# Patient Record
Sex: Female | Born: 1945 | Race: White | Hispanic: No | Marital: Married | Smoking: Former smoker
Health system: Southern US, Community
[De-identification: ages and names within clinical notes are randomized; demographics above are authoritative.]

## PROBLEM LIST (undated history)

## (undated) DIAGNOSIS — I10 Essential (primary) hypertension: Secondary | ICD-10-CM

## (undated) DIAGNOSIS — K5790 Diverticulosis of intestine, part unspecified, without perforation or abscess without bleeding: Secondary | ICD-10-CM

## (undated) DIAGNOSIS — E1140 Type 2 diabetes mellitus with diabetic neuropathy, unspecified: Secondary | ICD-10-CM

## (undated) DIAGNOSIS — G459 Transient cerebral ischemic attack, unspecified: Secondary | ICD-10-CM

## (undated) DIAGNOSIS — E119 Type 2 diabetes mellitus without complications: Secondary | ICD-10-CM

## (undated) DIAGNOSIS — K922 Gastrointestinal hemorrhage, unspecified: Secondary | ICD-10-CM

## (undated) DIAGNOSIS — K529 Noninfective gastroenteritis and colitis, unspecified: Secondary | ICD-10-CM

## (undated) DIAGNOSIS — K219 Gastro-esophageal reflux disease without esophagitis: Secondary | ICD-10-CM

## (undated) DIAGNOSIS — Z9889 Other specified postprocedural states: Secondary | ICD-10-CM

## (undated) DIAGNOSIS — G250 Essential tremor: Secondary | ICD-10-CM

## (undated) DIAGNOSIS — R011 Cardiac murmur, unspecified: Secondary | ICD-10-CM

## (undated) DIAGNOSIS — F419 Anxiety disorder, unspecified: Secondary | ICD-10-CM

## (undated) DIAGNOSIS — J302 Other seasonal allergic rhinitis: Secondary | ICD-10-CM

## (undated) DIAGNOSIS — M1990 Unspecified osteoarthritis, unspecified site: Secondary | ICD-10-CM

## (undated) DIAGNOSIS — R0602 Shortness of breath: Secondary | ICD-10-CM

## (undated) DIAGNOSIS — R112 Nausea with vomiting, unspecified: Secondary | ICD-10-CM

## (undated) HISTORY — DX: Cardiac murmur, unspecified: R01.1

## (undated) HISTORY — PX: ESOPHAGOGASTRODUODENOSCOPY: SHX1529

## (undated) HISTORY — PX: BREAST SURGERY: SHX581

## (undated) HISTORY — PX: ABDOMINAL HYSTERECTOMY: SHX81

## (undated) HISTORY — DX: Essential (primary) hypertension: I10

## (undated) HISTORY — DX: Type 2 diabetes mellitus with diabetic neuropathy, unspecified: E11.40

## (undated) HISTORY — DX: Unspecified osteoarthritis, unspecified site: M19.90

## (undated) HISTORY — PX: TONSILLECTOMY: SUR1361

## (undated) HISTORY — PX: CHOLECYSTECTOMY: SHX55

## (undated) HISTORY — PX: COLON SURGERY: SHX602

## (undated) HISTORY — DX: Transient cerebral ischemic attack, unspecified: G45.9

## (undated) HISTORY — PX: OTHER SURGICAL HISTORY: SHX169

## (undated) HISTORY — DX: Type 2 diabetes mellitus without complications: E11.9

## (undated) HISTORY — PX: APPENDECTOMY: SHX54

## (undated) HISTORY — PX: REPLACEMENT TOTAL KNEE BILATERAL: SUR1225

## (undated) HISTORY — DX: Noninfective gastroenteritis and colitis, unspecified: K52.9

## (undated) HISTORY — DX: Essential tremor: G25.0

## (undated) HISTORY — DX: Diverticulosis of intestine, part unspecified, without perforation or abscess without bleeding: K57.90

## (undated) HISTORY — DX: Gastrointestinal hemorrhage, unspecified: K92.2

---

## 2004-08-26 ENCOUNTER — Ambulatory Visit: Payer: Self-pay | Admitting: Internal Medicine

## 2004-09-16 ENCOUNTER — Ambulatory Visit (HOSPITAL_COMMUNITY): Admission: RE | Admit: 2004-09-16 | Discharge: 2004-09-16 | Payer: Self-pay | Admitting: Internal Medicine

## 2004-09-16 ENCOUNTER — Ambulatory Visit: Payer: Self-pay | Admitting: Internal Medicine

## 2004-09-16 HISTORY — PX: COLONOSCOPY: SHX174

## 2004-09-16 HISTORY — PX: ESOPHAGOGASTRODUODENOSCOPY: SHX1529

## 2004-10-28 ENCOUNTER — Ambulatory Visit: Payer: Self-pay | Admitting: Internal Medicine

## 2005-06-27 ENCOUNTER — Emergency Department (HOSPITAL_COMMUNITY): Admission: EM | Admit: 2005-06-27 | Discharge: 2005-06-27 | Payer: Self-pay | Admitting: Emergency Medicine

## 2005-07-01 ENCOUNTER — Ambulatory Visit: Payer: Self-pay | Admitting: Orthopedic Surgery

## 2005-08-12 ENCOUNTER — Ambulatory Visit: Payer: Self-pay | Admitting: Orthopedic Surgery

## 2006-08-24 ENCOUNTER — Ambulatory Visit: Payer: Self-pay | Admitting: Orthopedic Surgery

## 2006-10-04 ENCOUNTER — Ambulatory Visit: Payer: Self-pay | Admitting: Orthopedic Surgery

## 2007-03-30 ENCOUNTER — Ambulatory Visit (HOSPITAL_BASED_OUTPATIENT_CLINIC_OR_DEPARTMENT_OTHER): Admission: RE | Admit: 2007-03-30 | Discharge: 2007-03-30 | Payer: Self-pay | Admitting: Orthopedic Surgery

## 2008-12-02 ENCOUNTER — Telehealth (INDEPENDENT_AMBULATORY_CARE_PROVIDER_SITE_OTHER): Payer: Self-pay | Admitting: *Deleted

## 2009-03-03 HISTORY — PX: COLONOSCOPY: SHX174

## 2010-06-29 ENCOUNTER — Ambulatory Visit (INDEPENDENT_AMBULATORY_CARE_PROVIDER_SITE_OTHER): Payer: Self-pay | Admitting: Internal Medicine

## 2010-06-29 DIAGNOSIS — K279 Peptic ulcer, site unspecified, unspecified as acute or chronic, without hemorrhage or perforation: Secondary | ICD-10-CM

## 2010-07-25 ENCOUNTER — Encounter: Payer: Self-pay | Admitting: Cardiology

## 2010-07-27 ENCOUNTER — Encounter: Payer: Self-pay | Admitting: Cardiology

## 2010-07-29 ENCOUNTER — Encounter: Payer: Self-pay | Admitting: Cardiology

## 2010-07-30 ENCOUNTER — Encounter: Payer: Self-pay | Admitting: Cardiology

## 2010-09-15 ENCOUNTER — Encounter: Payer: Self-pay | Admitting: Cardiology

## 2010-09-16 ENCOUNTER — Encounter: Payer: Self-pay | Admitting: Cardiology

## 2010-09-16 ENCOUNTER — Ambulatory Visit (INDEPENDENT_AMBULATORY_CARE_PROVIDER_SITE_OTHER): Payer: PRIVATE HEALTH INSURANCE | Admitting: Cardiology

## 2010-09-16 DIAGNOSIS — E119 Type 2 diabetes mellitus without complications: Secondary | ICD-10-CM | POA: Insufficient documentation

## 2010-09-16 DIAGNOSIS — R0602 Shortness of breath: Secondary | ICD-10-CM | POA: Insufficient documentation

## 2010-09-16 DIAGNOSIS — R011 Cardiac murmur, unspecified: Secondary | ICD-10-CM

## 2010-09-16 DIAGNOSIS — I10 Essential (primary) hypertension: Secondary | ICD-10-CM | POA: Insufficient documentation

## 2010-09-16 DIAGNOSIS — N1832 Type 2 diabetes mellitus with diabetic chronic kidney disease: Secondary | ICD-10-CM | POA: Insufficient documentation

## 2010-09-16 NOTE — Assessment & Plan Note (Signed)
Reports long-standing history of cardiac murmur and previous testing approximately 20 years ago. In light of her shortness of breath, progressive since January, a followup echocardiogram will be obtained for further assessment, although on exam her murmur is not particularly impressive.

## 2010-09-16 NOTE — Assessment & Plan Note (Signed)
Continue regular followup with Dr. Sherril Croon.

## 2010-09-16 NOTE — Patient Instructions (Signed)
Your physician has requested that you have an echocardiogram. Echocardiography is a painless test that uses sound waves to create images of your heart. It provides your doctor with information about the size and shape of your heart and how well your heart's chambers and valves are working. This procedure takes approximately one hour. There are no restrictions for this procedure.  Your physician has requested that you have a lexiscan myoview. For further information please visit [email or web address] Please follow instruction sheet, as given.  Your physician recommends that you continue on your current medications as directed. Please refer to the Current Medication list given to you today.  Your physician recommends that you schedule a follow-up appointment in: 1 month

## 2010-09-16 NOTE — Assessment & Plan Note (Signed)
Blood pressure control is reasonable today, no medication changes made.

## 2010-09-16 NOTE — Progress Notes (Signed)
Clinical Summary [name] is a 65 y.o.female referred for cardiology consultation by Dr. Sherril Croon. Record review finds admission to [facility] in March with nausea and emesis, diagnosed with gastroenteritis, also possibly pneumonia. At that time total CK was elevated at 194 with MB relative index of 5.1, however troponin I less than 0.01. Hemoglobin was 10.4. Reason for referral is stated as "abnormal cardiac enzymes."  [name] denies any exertional chest pain. She has noticed increasing exertional fatigue and shortness of breath since January, with activities such as climbing stairs or walking across a parking lot. She reports a long-standing history of "heart murmur" and reports previous cardiac testing through [facility] approximately 20 years ago, records not available. She denies any known personal history of CAD or major valvular heart disease.  She does indicate significant family history of cardiovascular disease in her siblings, had similar age.   Allergies  Allergen Reactions  . Codeine   . Morphine And Related     Current outpatient prescriptions:atorvastatin (LIPITOR) 10 MG tablet, Take 10 mg by mouth daily.  , Disp: , Rfl: ;  clonazePAM (KLONOPIN) 0.5 MG tablet, Take 0.5 mg by mouth 2 (two) times daily. , Disp: , Rfl: ;  Coenzyme Q10 (COQ10) 100 MG CAPS, Take 1 capsule by mouth daily.  , Disp: , Rfl: ;  CYANOCOBALAMIN IJ, Inject as directed.  , Disp: , Rfl: ;  dicyclomine (BENTYL) 20 MG tablet, Take 20 mg by mouth every 6 (six) hours.  , Disp: , Rfl:  diltiazem (CARDIZEM CD) 180 MG 24 hr capsule, Take 180 mg by mouth daily.  , Disp: , Rfl: ;  diphenoxylate-atropine (LOMOTIL) 2.5-0.025 MG per tablet, Take 1 tablet by mouth as needed.  , Disp: , Rfl: ;  ergocalciferol (VITAMIN D2) 50000 UNITS capsule, Take 50,000 Units by mouth once a week.  , Disp: , Rfl: ;  fexofenadine (ALLEGRA) 180 MG tablet, Take 180 mg by mouth daily.  , Disp: , Rfl:  glyBURIDE-metformin  (GLUCOVANCE) 2.5-500 MG per tablet, Take 1 tablet by mouth 2 (two) times daily. , Disp: , Rfl: ;  lisinopril-hydrochlorothiazide (PRINZIDE,ZESTORETIC) 10-12.5 MG per tablet, Take 1 tablet by mouth daily.  , Disp: , Rfl: ;  meclizine (ANTIVERT) 25 MG tablet, Take 25 mg by mouth 3 (three) times daily.  , Disp: , Rfl: ;  pantoprazole (PROTONIX) 40 MG tablet, Take 40 mg by mouth daily.  , Disp: , Rfl:  primidone (MYSOLINE) 50 MG tablet, Take 50 mg by mouth daily. 4 tabs bid  , Disp: , Rfl: ;  propranolol (INDERAL LA) 120 MG 24 hr capsule, Take 120 mg by mouth daily.  , Disp: , Rfl: ;  traMADol (ULTRAM) 50 MG tablet, Take 50 mg by mouth every 6 (six) hours as needed.  , Disp: , Rfl: ;  DISCONTD: benzonatate (TESSALON) 200 MG capsule, Take 200 mg by mouth 3 (three) times daily as needed.  , Disp: , Rfl:  DISCONTD: Choline Fenofibrate 135 MG capsule, Take 135 mg by mouth daily.  , Disp: , Rfl: ;  DISCONTD: fluticasone (VERAMYST) 27.5 MCG/SPRAY nasal spray, 2 sprays by Nasal route daily.  , Disp: , Rfl: ;  DISCONTD: gabapentin (NEURONTIN) 100 MG capsule, Take 100 mg by mouth 2 (two) times daily.  , Disp: , Rfl: ;  DISCONTD: HYDROcodone-acetaminophen (VICODIN) 5-500 MG per tablet, Take 1 tablet by mouth every 6 (six) hours as needed.  , Disp: , Rfl:  DISCONTD: hyoscyamine (LEVBID) 0.375 MG 12 hr  tablet, Take 0.375 mg by mouth every 12 (twelve) hours as needed.  , Disp: , Rfl: ;  DISCONTD: METHYLPREDNISOLONE, PAK, PO, Take 1 tablet by mouth daily.  , Disp: , Rfl: ;  DISCONTD: mometasone (NASONEX) 50 MCG/ACT nasal spray, 2 sprays by Nasal route daily.  , Disp: , Rfl: ;  DISCONTD: omeprazole (PRILOSEC) 20 MG capsule, Take 20 mg by mouth daily.  , Disp: , Rfl:  DISCONTD: propranolol (INDERAL LA) 120 MG 24 hr capsule, Take 120 mg by mouth daily.  , Disp: , Rfl: ;  DISCONTD: scopolamine (TRANSDERM-SCOP) 1.5 MG, Place 1 patch onto the skin every 3 (three) days.  , Disp: , Rfl: ;  DISCONTD: silver sulfADIAZINE (SILVADENE) 1 %  cream, Apply topically daily.  , Disp: , Rfl: ;  DISCONTD: sucralfate (CARAFATE) 1 G tablet, Take 1 g by mouth 4 (four) times daily.  , Disp: , Rfl:   Past Medical History  Diagnosis Date  . Essential hypertension, benign   . Type 2 diabetes mellitus   . Diabetic neuropathy   . GI bleed     NSAID use  . TIA (transient ischemic attack)     1998  . Diverticulosis   . Urinary incontinence   . Degenerative joint disease   . Benign essential tremor     Past Surgical History  Procedure Date  . Replacement total knee bilateral   . Cholecystectomy   . Arthroscopic left shoulder surgery   . Appendectomy   . Colostomy with reversal   . Right elbow surgery     Family History  Problem Relation Age of Onset  . Cancer Father     Leukemia    Social History [name] reports that she has quit smoking. Her smoking use included Cigarettes. She has never used smokeless tobacco. [name] reports that she does not drink alcohol.  Review of Systems History of palpitations, no dizziness or syncope. Reports some solid dysphagia. Reflux symptoms. No dizziness or syncope. No orthopnea or PND. No unusual bleeding. Otherwise reviewed and negative.  Physical Examination Filed Vitals:   09/16/10 1431  BP: 130/66  Pulse: 89  Overweight woman in no acute distress. HEENT: Conjunctiva and lids normal, oropharynx with moist mucosa. Neck: Supple, no elevated JVP or bruits. Cardiac: Regular rate and rhythm, soft late systolic murmur, no S3 gallop or rub. Abdomen: Soft, nontender, bowel sounds present. Skin: Warm and dry. Musculoskeletal: No kyphosis. Neuropsychiatric: Alert and oriented x3, affect appropriate.   ECG Normal sinus rhythm at 86 beats per minute.   Problem List and Plan

## 2010-09-16 NOTE — Assessment & Plan Note (Signed)
Progressive since January. Does report family history of cardiac disease, also personal cardiac risk factors including diabetes mellitus and hypertension. Resting ECG is normal. Followup ischemic testing is being arranged. A Lexiscan Cardiolite will be performed as the patient states she will likely  have difficulty ambulating on the treadmill.

## 2010-09-21 ENCOUNTER — Other Ambulatory Visit: Payer: Self-pay | Admitting: Cardiology

## 2010-09-21 DIAGNOSIS — R0602 Shortness of breath: Secondary | ICD-10-CM

## 2010-09-24 ENCOUNTER — Ambulatory Visit (HOSPITAL_COMMUNITY): Payer: Medicare Other

## 2010-09-24 ENCOUNTER — Encounter (HOSPITAL_COMMUNITY): Payer: PRIVATE HEALTH INSURANCE

## 2010-09-24 ENCOUNTER — Ambulatory Visit (HOSPITAL_COMMUNITY)
Admission: RE | Admit: 2010-09-24 | Discharge: 2010-09-24 | Disposition: A | Discharge status: Home / Self Care | Payer: Medicare Other | Source: Ambulatory Visit | Attending: Cardiology | Admitting: Cardiology

## 2010-09-24 ENCOUNTER — Encounter (HOSPITAL_COMMUNITY): Payer: Self-pay

## 2010-09-24 ENCOUNTER — Encounter (HOSPITAL_COMMUNITY)
Admission: RE | Admit: 2010-09-24 | Discharge: 2010-09-24 | Disposition: A | Discharge status: Home / Self Care | Payer: PRIVATE HEALTH INSURANCE | Source: Ambulatory Visit | Attending: Cardiology | Admitting: Cardiology

## 2010-09-24 ENCOUNTER — Ambulatory Visit (INDEPENDENT_AMBULATORY_CARE_PROVIDER_SITE_OTHER): Payer: PRIVATE HEALTH INSURANCE | Admitting: *Deleted

## 2010-09-24 DIAGNOSIS — I517 Cardiomegaly: Secondary | ICD-10-CM

## 2010-09-24 DIAGNOSIS — R0989 Other specified symptoms and signs involving the circulatory and respiratory systems: Secondary | ICD-10-CM | POA: Insufficient documentation

## 2010-09-24 DIAGNOSIS — R079 Chest pain, unspecified: Secondary | ICD-10-CM

## 2010-09-24 DIAGNOSIS — R0602 Shortness of breath: Secondary | ICD-10-CM

## 2010-09-24 DIAGNOSIS — E119 Type 2 diabetes mellitus without complications: Secondary | ICD-10-CM | POA: Insufficient documentation

## 2010-09-24 DIAGNOSIS — I10 Essential (primary) hypertension: Secondary | ICD-10-CM | POA: Insufficient documentation

## 2010-09-24 DIAGNOSIS — R011 Cardiac murmur, unspecified: Secondary | ICD-10-CM | POA: Insufficient documentation

## 2010-09-24 DIAGNOSIS — R748 Abnormal levels of other serum enzymes: Secondary | ICD-10-CM | POA: Insufficient documentation

## 2010-09-24 DIAGNOSIS — R0609 Other forms of dyspnea: Secondary | ICD-10-CM | POA: Insufficient documentation

## 2010-09-24 DIAGNOSIS — R9439 Abnormal result of other cardiovascular function study: Secondary | ICD-10-CM | POA: Insufficient documentation

## 2010-09-24 MED ORDER — TECHNETIUM TC 99M TETROFOSMIN IV KIT
30.0000 | PACK | Freq: Once | INTRAVENOUS | Status: AC | PRN
  Administered 2010-09-24: 28.5 via INTRAVENOUS

## 2010-09-24 MED ORDER — TECHNETIUM TC 99M TETROFOSMIN IV KIT
10.0000 | PACK | Freq: Once | INTRAVENOUS | Status: AC | PRN
  Administered 2010-09-24: 10.6 via INTRAVENOUS

## 2010-09-28 NOTE — Progress Notes (Signed)
Please see dictated report for Stress Nuclear Study.   

## 2010-10-06 NOTE — Op Note (Signed)
NAMEMARISELDA, Bowen              ACCOUNT NO.:  [id number]   MEDICAL RECORD NO.:  [id number]          PATIENT TYPE:  AMB   LOCATION:  DSC                          FACILITY:  [facility]   PHYSICIAN:  Katy Fitch. Sypher, M.D. DATE OF BIRTH:  1945/07/28   DATE OF PROCEDURE:  03/30/2007  DATE OF DISCHARGE:                               OPERATIVE REPORT   PREOPERATIVE DIAGNOSIS:  Chronic pain, left shoulder due to a  combination of predicaments including adhesive capsulitis associated  with type 2 diabetes and chronic stage II impingement due to [facility]  arthropathy, rule out partial or small full-thickness rotator cuff tear  based on preoperative MRI findings.   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   OPERATION:  1. Examination of left shoulder under anesthesia documenting loss of      extension, external and internal rotation as well as combined      elevation.  2. Arthroscopic evaluation of glenohumeral joint followed by      debridement of granulation tissue and release of anterior superior      glenohumeral ligament and debridement of capsular adhesions with      tenolysis of subscapularis tendon and resection of rotator interval      as well as debridement of a minor partial-thickness supraspinatus      rotator cuff deep surface tear.  3. Arthroscopic subacromial decompression with bursectomy, tenolysis      of rotator cuff, acromioplasty and relaxation of coracoacromial      ligament.  4. Arthroscopic distal clavicle resection.   OPERATIONS:  [name], M.D.   ASSISTANT:  Annye Rusk, PA-C.   ANESTHESIA:  General by endotracheal technique.   SUPERVISING ANESTHESIOLOGIST:  Dr. Gypsy Balsam.   INDICATIONS:  [name] is a 65 year old woman referred for  evaluation and management of a  progressively painful left shoulder.  She  had type 2 diabetes that was reasonably controlled.  She was referred to  [facility] [place]  prolonged course of supervised therapy without improvement in her range  of motion or pain.  Preoperative plain films documented evidence of AC  arthropathy with an unfavorable medial acromial morphology and a  prominent anterior inferior distal clavicle.  The MRI of her shoulder  revealed tendinopathy of the supraspinatus tendon consistent with  chronic impingement and no sign of retracted rotator cuff tear.  On a  clinical basis she had significant loss of rotation, extension and  elevation all consistent with adhesive capsulitis.   Due to her failed response to nonoperative measures, she is brought to  the operating room at this time for the aforementioned procedure.   PROCEDURE:  [name] was brought to the operating room and placed  in the supine position upon the operating room table.   Following an anesthesia consult with Dr. Gypsy Balsam an interscalene block was  recommended preoperatively and accepted by Ms. [name].   Dr. Gypsy Balsam placed the block without  complication in the holding area  leading to complete anesthesia of the left arm and forequarter.   She was brought to room five of the [facility], placed in the  supine position upon the operating room table and under Dr. Burnett Corrente  direct supervision general endotracheal anesthesia induced.  She was  positioned in the beach-chair position with the aid of a torso and head  holder designed for shoulder arthroscopy.  Examination of the shoulder  under anesthesia revealed combined elevation of 150 degrees, external  rotation limited at 70 degrees, extension missing 35 degrees from the  plane of the scapulae and internal rotation of 60 degrees.   The left arm and forequarter were prepped with DuraPrep and draped with  impervious arthroscopy  drapes.  The scope was introduced through a  standard posterior viewing portal.  Diagnostic arthroscopy revealed  abundant granulation tissue within the glenohumeral joint and  obliteration of the anatomy of the anterior capsular folds between the  subscapularis and the anterior glenohumeral ligaments.   An anterior portal was created under direct vision followed by a  thorough debridement of the granulation tissue, tenolysis of the  subscapularis, resection of the rotator interval, and a release of the  anterior superior glenohumeral ligament and release of multiple  adhesions with the middle glenohumeral ligament using the cutting  cautery.   Thereafter, full extension of the shoulder was recovered and external  rotation was improved to 90 degrees.   The bipolar cautery was used to obtain intra-articular hemostasis  followed by inspection of the rotator cuff.  There was a partial-  thickness tear of the supraspinatus which was debrided with a suction  shaver.  The infraspinatus and teres minor appeared normal.  The  subscapularis was normal with the exception of the aforementioned  adhesions.   The biceps tendon had some degenerative changes at the superior labrum.  There was no sign of a significant biceps pathology through the rotator  interval.   The scope was removed from the glenohumeral joint and placed in the  subacromial space.  A florid bursitis was noted.  After bursectomy the  coracoacromial ligament was partially released from the acromion  followed by leveling the acromion to a type 1 morphology with a suction  bur.  The [facility] joint was instrumented through an anterior portal and a  suction bur was used to resect the distal 18 mm of clavicle.  After  hemostasis the cuff was thoroughly tenolysed and inspected.  There was  no sign of a bursal side rotator cuff tear.   After hemostasis was achieved the arthroscopic equipment was removed.  The portals were repaired with a  mattress suture of 3-0 Prolene followed  by application of a sterile dressing of Xeroflo, sterile gauze and paper  tape.   Ms. Schuitema will be admitted to recovery care.  She will likely be  discharged home this afternoon if her vital signs remain stable.  If  there are any issues with her vital signs including hypertension or  difficulty with pain management she will be admitted to recovery care  for a 23-hour observation.   We anticipate dressing change to Tegaderm and initiation of immediate  range of motion excise program in 24 hours.      Katy Fitch Sypher, M.D.  Electronically Signed     RVS/MEDQ  D:  03/30/2007  T:  03/30/2007  Job:  [id number]   cc:   [facility]  Katy Fitch. Sypher, M.D.

## 2010-10-09 NOTE — Consult Note (Signed)
NAME:  [name], [name]              ACCOUNT NO.:  [id number]   MEDICAL RECORD NO.:  [id number]           PATIENT TYPE:  AMB   LOCATION:                                 FACILITY:   PHYSICIAN:  R. Roetta Sessions, M.D. DATE OF BIRTH:  1945/10/12   DATE OF CONSULTATION:  08/26/2004  DATE OF DISCHARGE:                                   CONSULTATION   PHYSICIAN REQUESTING CONSULTATION:  Doreen Beam, M.D.   REASON FOR CONSULTATION:  Abdominal pain, diverticulitis.   HISTORY OF PRESENT ILLNESS:  [name] is a 65 year old lady who presents  today for further evaluation of chronic abdominal pain.  She recently was  hospitalized with abdominal pain in February 2006.  At that time she had had  about a two-week history of intermittent worsening abdominal pain.  She had  a CT of the abdomen and pelvis, which revealed a slight amount of sigmoid  colon wall thickening with occasional diverticula involving the descending  colon and several more in the sigmoid colon.  There was no stranding  suggesting acute diverticulitis, however.  There were several area of soft  tissue noted within the ascending and transverse colon, which may be stool,  but underlying malignancy cannot be excluded.  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  This appeared unchanged  compared to previous CT.  She had a soft tissue density in the area of the  left ovarian fossa measuring 2.9 cm.  She was given Cipro and Flagyl while  hospitalized.  She states that her abdominal pain did improve.  She also  complains of alternating constipation and diarrhea.  When she gets diarrhea,  she may have just one or two episodes of loose stools.  Then she may go  several days without a bowel movement.  She had had bright red blood per  rectum noted primarily on the toilet tissue.  Denies any melena or nausea or  vomiting.  She has  poorly-controlled heartburn symptoms, has had heartburn  for over six to eight years.  Her last EGD was over five years ago by Dr.  Lovell Sheehan, and she reports it to be normal.  She has been on Nexium, but this  caused abdominal pain.  Aciphex did not seem to help.  Currently she is on  Prilosec with only 50% control of symptoms.  She denies any dysphagia or  odynophagia.  Currently her bowel movements are regular.  She states she has  worsening lower abdominal pain especially if she eats certain foods like  nuts or corn or popcorn.  Her last colonoscopy was in August 2004 by Dr.  Justice Britain.  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  She states that it was confirmed by had  apparently sealed on its own.   CURRENT MEDICATIONS:  1.  Metformin 500 mg daily.  2.  Inderal LA 120 mg daily.  3.  Lisinopril/HCTZ  10/12.5 mg daily.  4.  Zetia 10 mg daily.  5.  Cardizem LA 180 mg daily.  6.  Topamax 25 mg daily.  7.  Primidone 50 mg daily.  8.  Aspirin 81 mg daily.  9.  Aleve 220 mg q.i.d.  10. Stool softener 100 mg b.i.d.  11. Prenatal Complete one daily.  12. Clarinex 5 mg daily.  13. B12 injection once monthly.  14. Prilosec OTC daily.  15. Tums p.r.n.   ALLERGIES:  CODEINE causes tachycardia.  MORPHINE causes hallucinations.  DEMEROL causes nausea and vomiting.  ANTI-INFLAMMATORIES cause diarrhea.   PAST MEDICAL HISTORY:  1.  Hypertension.  2.  TIA in 1999.  3.  Gastroesophageal reflux disease.  4.  Diabetes mellitus.  5.  Tremors.  6.  Osteoarthritis.  7.  Hypercholesterolemia.  8.  B12 deficiency.  9.  Hemorrhoids.   PAST SURGICAL HISTORY:  1.  Tonsillectomy.  2.  Appendectomy.  3.  Tubal ligation, followed by partial hysterectomy.  Her left ovary      remains.  4.  She has had surgery for pneumothorax twice.  5.  She had diverticulitis with perforation requiring a segmental resection      with colostomy, which was reversed  six weeks later.  This was in the      1980s.  6.  She has had bilateral knee replacements.  The left one was done most      recently, in March 2005.   FAMILY HISTORY:  Mother had Alzheimer's.  Father died of leukemia.  No  family history of colorectal cancer or chronic GI illnesses.   SOCIAL HISTORY:  She is married and has a son and daughter.  She is retired  from [employer].  She stopped smoking in 1999 and never consumed  of alcohol.   REVIEW OF SYSTEMS:  GASTROINTESTINAL:  See HPI.  CONSTITUTIONAL:  Denies any  weight loss.  CARDIOPULMONARY:  Denies chest pain or shortness of breath.   PHYSICAL EXAMINATION:  VITAL SIGNS:  Weight 174.5, height 5 feet 2 inches.  Temperature 98, blood pressure 118/62, pulse 76.  GENERAL:  A pleasant, well-nourished, well-developed Caucasian female in no  acute distress.  SKIN:  Warm and dry, no jaundice.  HEENT:  Pupils equal, round, and reactive to light.  Conjunctivae are pink.  Sclerae are nonicteric.  Oropharyngeal mucosa moist and pink.  No lesions,  erythema or exudate.  NECK:  No lymphadenopathy or thyromegaly.  CHEST:  Lungs are clear to auscultation.  CARDIAC:  Regular rate and rhythm, normal S1, S2.  No murmurs, rubs or  gallops.  ABDOMEN:  Positive bowel sounds, obese but symmetrical, soft.  She has  moderate epigastric tenderness and mild to moderate tenderness in the mid-  low abdomen to deep palpation.  No organomegaly or masses.  No rebound  tenderness or guarding.  No abdominal bruits or abdominal hernias.  EXTREMITIES:  No edema.   IMPRESSION:  [name] is a 65 year old lady with multiple GI issues.  She complains of chronic intermittent abdominal pain, primarily in the lower  abdomen, which she has had for years.  She does have a history of  diverticulosis and has alternating constipation and diarrhea, which may be due to irritable bowel syndrome.  She recently was treated with Cipro and  Flagyl based on her  symptoms as well as some thickening of the sigmoid  colon.  Based on the findings and CT as outlined above, she does need  colonoscopy  at this time for further evaluation.  She continues to have  intermittent small-volume hematochezia, which may be due to a benign source.  She has typical gastroesophageal reflux disease symptoms, which are  uncontrolled on proton pump inhibitor therapy.  She has epigastric pain.  I  am concerned given the significant amount of Aleve and aspirin that she  takes on a regular basis.  Need to rule out complications of acid reflux as  well as peptic ulcer disease.  With regard to possible duodenal  diverticulum, this should be reachable with esophagogastroduodenoscopy and  can be further evaluated to make sure that what was seen on CT is actually a  benign finding.   PLAN:  1.  EGD and colonoscopy in the near future.  Hopefully, the lesion seen on      CT with regard to the duodenum can be evaluated via EGD.  2.  She will continue Prilosec for now.  3.  She will be given SBE prophylaxis given a knee replacement within one      year.  4.  If endoscopies are unremarkable, she will need to have small bowel      follow-through as a next step in the evaluation.      LL/MEDQ  D:  08/26/2004  T:  08/26/2004  Job:  [id number]   cc:   Doreen Beam  [address]  [place]  [place] [address]  Fax: [phone]

## 2010-10-09 NOTE — Op Note (Signed)
NAME:  [name], [name]              ACCOUNT NO.:  [id number]   MEDICAL RECORD NO.:  [id number]          PATIENT TYPE:  AMB   LOCATION:  DAY                           FACILITY:  [facility]   PHYSICIAN:  R. Roetta Sessions, M.D. DATE OF BIRTH:  04-Dec-1945   DATE OF PROCEDURE:  09/16/2004  DATE OF DISCHARGE:                                 OPERATIVE REPORT   PROCEDURE:  Diagnostic esophagogastroduodenoscopy followed by colonoscopy.   INDICATIONS FOR PROCEDURE:  The patient is a 65 year old lady with history  of complicated diverticulitis requiring sigmoid resection previously who has  intermittent abdominal pain with periods of constipation alternating with  nonbloody diarrhea. 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 Also, there were multiple lesions in  the colon suggestive of stool, but metastatic disease cannot be ruled out.  She has low volume intermittent hematochezia. EGD and colonoscopy are now  being done. This approach has been discussed with the patient at length.  Potential risks, benefits, and alternatives have been reviewed and questions  answered. She is agreeable. Please see documentation in the medical record.   PROCEDURE NOTE:  O2 saturation, blood pressure, pulse, and respirations were  monitored throughout the entire procedure. Conscious sedation with Versed 6  mg IV and fentanyl 100 mcg IV. The patient received Phenergan 25 mg IV  diluted, slow IV push prior to procedure. 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   INSTRUMENT:  Olympus video chip system.   FINDINGS:  Examination of the tubular esophagus revealed no mucosal  abnormalities. EG junction was easily traversed.   Stomach:  The gastric cavity was empty and insufflated well with air.  Thorough examination of gastric mucosa including retroflexed view of the  proximal stomach and esophagogastric junction demonstrated no  abnormalities.  Pylorus was patent and easily traversed. Examination of the bulb and second  and third portion was undertaken. The mucosa appeared normal. I was unable  to identify diverticulum although a swallow diverticulum or a short narrow  mouth deep diverticulum may not have been seen with a gastroscope. At any  rate, the duodenal mucosa appeared normal.   THERAPEUTIC/DIAGNOSTIC MANEUVERS:  None.   The patient tolerated the procedure well. She was prepared for colonoscopy.  Digital rectal examination revealed no abnormalities.   ENDOSCOPIC FINDINGS:  Prep was excellent.   Rectum:  Examination of the rectal mucosa including retroflexed view of the  anal verge revealed anal papilla and internal hemorrhoids only.   Colon:  Colonic mucosa was surveyed from the rectosigmoid junction through  the left, transverse, and right colon to the area of the appendiceal  orifice, ileocecal valve, and cecum. Surgical anastomosis was identified at  30 cm. It appeared normal. There were diverticula distally from this lesion  and proximally all the day around into the right colon. The mucosa otherwise  appeared normal to the cecum. The terminal ileum was intubated to 15 cm.  This segment of the GI tract also appeared normal. Olympus videoscope was  slowly withdrawn. All previously mentioned mucosal surfaces were  again seen.  No other abnormalities were observed. The patient tolerated both procedures  well and was reactive to endoscopy.   IMPRESSION:  ESOPHAGOGASTRODUODENOSCOPY:  Normal esophagus, stomach, D1  through D2.   COLONOSCOPY:  Internal hemorrhoids and anal papilla, otherwise normal  rectum. Surgical anastomosis at 30 mg. Diverticula proximally and distally  to the anastomosis; however, the colonic mucosa appeared normal to the  cecum. Normal terminal ileum.   I suspect the patient bled from hemorrhoids recently, and it sounds more or  less as though she has irritable bowel syndrome.  Findings on the CT are more  likely artifactual although an occult duodenal diverticulum would remain a  possibility; however, has no clinical significance.   RECOMMENDATIONS:  Diverticulosis and hemorrhoid literature given to Ms.  [name]. She does take a stool softener daily but does not take any extra  fiber. I have strongly recommended to her that she take a substantial fiber  supplement with Metamucil or Citrucel on a daily, every day basis. Anusol HC  suppositories 1 per rectum at bedtime for 10 days. NuLev prescription 1  tablet on the tongue before meals on an as needed basis. Episodes of  abdominal pain. We will plan to see this nice lady back in six weeks and see  how she is doing.      RMR/MEDQ  D:  09/16/2004  T:  09/16/2004  Job:  [id number]   cc:   Doreen Beam  [address]  [place]  [place] [address]  Fax: [phone]

## 2010-10-22 ENCOUNTER — Ambulatory Visit: Payer: PRIVATE HEALTH INSURANCE | Admitting: Cardiology

## 2010-12-28 ENCOUNTER — Encounter (INDEPENDENT_AMBULATORY_CARE_PROVIDER_SITE_OTHER): Payer: Self-pay | Admitting: *Deleted

## 2010-12-30 ENCOUNTER — Encounter (INDEPENDENT_AMBULATORY_CARE_PROVIDER_SITE_OTHER): Payer: Self-pay

## 2011-02-08 ENCOUNTER — Encounter (INDEPENDENT_AMBULATORY_CARE_PROVIDER_SITE_OTHER): Payer: Self-pay | Admitting: Internal Medicine

## 2011-02-08 ENCOUNTER — Ambulatory Visit (INDEPENDENT_AMBULATORY_CARE_PROVIDER_SITE_OTHER): Payer: PRIVATE HEALTH INSURANCE | Admitting: Internal Medicine

## 2011-02-08 VITALS — BP 122/70 | HR 68 | Ht 61.0 in | Wt 173.4 lb

## 2011-02-08 DIAGNOSIS — R1310 Dysphagia, unspecified: Secondary | ICD-10-CM

## 2011-02-08 DIAGNOSIS — K5732 Diverticulitis of large intestine without perforation or abscess without bleeding: Secondary | ICD-10-CM

## 2011-02-08 DIAGNOSIS — K5792 Diverticulitis of intestine, part unspecified, without perforation or abscess without bleeding: Secondary | ICD-10-CM

## 2011-02-08 DIAGNOSIS — K219 Gastro-esophageal reflux disease without esophagitis: Secondary | ICD-10-CM

## 2011-02-08 DIAGNOSIS — R1314 Dysphagia, pharyngoesophageal phase: Secondary | ICD-10-CM

## 2011-02-08 NOTE — Progress Notes (Signed)
Subjective:     Patient ID: [name], female   DOB: Jan 16, 1946, 65 y.o.   MRN: [id number]  HPI  [name] is here for follow.  She has a hx of diverticulitis and IBS.  Her last episode of diverticulitis was in the  earlier part of the summer. She says she had been eating tomatoes.  She is having 0-4 stools a day.  No rectal bleeding or melena. Her stools are formed. No abdominal pain. Her appetite is good.  No weight loss.  She has actually gained 2 pounds She says she strangles at times on food. On father's day she says she strangled.   She says she will strangle on her own saliva.  Foods feel like they want to lodge.  She says this is occurring about twice a week.  Her lawt EGD 09/16/2004 by Dr. Jena Gauss which was normal.  No acid reflux with Protonix.  She avoids food that cause acid reflux.  Her last colonoscopy was in 2010  Which was normal.  Small rectal polyp ablated.  Review of Systems  see hpi Current Outpatient Prescriptions  Medication Sig Dispense Refill  . atorvastatin (LIPITOR) 10 MG tablet Take 10 mg by mouth daily.        . clonazePAM (KLONOPIN) 0.5 MG tablet Take 0.5 mg by mouth 2 (two) times daily.       . Coenzyme Q10 (COQ10) 100 MG CAPS Take 1 capsule by mouth daily.        . CYANOCOBALAMIN IJ Inject as directed.        . dicyclomine (BENTYL) 20 MG tablet Take 20 mg by mouth daily.       Marland Kitchen diltiazem (CARDIZEM CD) 180 MG 24 hr capsule Take 180 mg by mouth daily.        . diphenoxylate-atropine (LOMOTIL) 2.5-0.025 MG per tablet Take 1 tablet by mouth as needed.        . ergocalciferol (VITAMIN D2) 50000 UNITS capsule Take 50,000 Units by mouth once a week.        . fexofenadine (ALLEGRA) 180 MG tablet Take 180 mg by mouth daily.        Marland Kitchen glyBURIDE-metformin (GLUCOVANCE) 2.5-500 MG per tablet Take 1 tablet by mouth 2 (two) times daily.       Marland Kitchen lisinopril-hydrochlorothiazide (PRINZIDE,ZESTORETIC) 10-12.5 MG per tablet Take 1 tablet by mouth daily.        . pantoprazole (PROTONIX) 40  MG tablet Take 40 mg by mouth daily.        . primidone (MYSOLINE) 50 MG tablet Take 50 mg by mouth daily. 4 tabs bid        . propranolol (INDERAL LA) 120 MG 24 hr capsule Take 120 mg by mouth daily.        . traMADol (ULTRAM) 50 MG tablet Take 50 mg by mouth every 6 (six) hours as needed.        . meclizine (ANTIVERT) 25 MG tablet Take 25 mg by mouth 3 (three) times daily.          Past Surgical History  Procedure Date  . Replacement total knee bilateral   . Cholecystectomy   . Arthroscopic left shoulder surgery   . Appendectomy   . Colostomy with reversal   . Right elbow surgery   . Esophagogastroduodenoscopy     EGD 06/08/2008  . Colonoscopy 03/03/09  . Colonoscopy 09/16/2004    ROURK  . Esophagogastroduodenoscopy 09/16/04    DIAG EGD W/TCS   Past  Medical History  Diagnosis Date  . Essential hypertension, benign   . Type 2 diabetes mellitus   . Diabetic neuropathy   . GI bleed     NSAID use  . TIA (transient ischemic attack)     1998  . Diverticulosis   . Urinary incontinence   . Degenerative joint disease   . Benign essential tremor   . Cardiac murmur     Reportedly for years  . Diabetes mellitus   . Chronic diarrhea    Allergies  Allergen Reactions  . Codeine   . Morphine And Related   . Nsaids         Objective:   Physical Exam  Blood pressure 122/70, pulse 68, height 5\' 1"  (1.549 m), weight 173 lb 6.4 oz (78.654 kg). Alert and oriented. Skin warm and dry. Oral mucosa is moist. Natural teeth in good condition. Sclera anicteric, conjunctivae is pink. Thyroid not enlarged. No cervical lymphadenopathy. Lungs clear. Heart regular rate and rhythm  Murmur heard.  Abdomen is soft. Bowel sounds are positive. No hepatomegaly. No abdominal masses felt. No tenderness.  No edema to lower extremities. Patient is alert and oriented.      Assessment:     Diverticulitis: Asymptomatic at this time. GERD controlled with Protonix, Dysphagia to solids as well as  liquids.     Plan:    Barium Pill study.  Soft foods. Chew food well.  Further  Recommendations once we have the pill study back.  OV in 6 months.

## 2011-02-15 ENCOUNTER — Ambulatory Visit (HOSPITAL_COMMUNITY)
Admission: RE | Admit: 2011-02-15 | Discharge: 2011-02-15 | Disposition: A | Discharge status: Home / Self Care | Payer: Medicare Other | Source: Ambulatory Visit | Attending: Internal Medicine | Admitting: Internal Medicine

## 2011-02-15 ENCOUNTER — Encounter (HOSPITAL_COMMUNITY): Payer: Self-pay

## 2011-02-15 DIAGNOSIS — K449 Diaphragmatic hernia without obstruction or gangrene: Secondary | ICD-10-CM | POA: Insufficient documentation

## 2011-02-15 DIAGNOSIS — K224 Dyskinesia of esophagus: Secondary | ICD-10-CM | POA: Insufficient documentation

## 2011-02-15 DIAGNOSIS — R1310 Dysphagia, unspecified: Secondary | ICD-10-CM | POA: Insufficient documentation

## 2011-02-18 ENCOUNTER — Telehealth (INDEPENDENT_AMBULATORY_CARE_PROVIDER_SITE_OTHER): Payer: Self-pay | Admitting: *Deleted

## 2011-02-18 NOTE — Telephone Encounter (Signed)
EGD/ED sch'd 04/01/11 @ 3:00 (2:00), instructions mailed

## 2011-03-02 LAB — BASIC METABOLIC PANEL
GFR calc Af Amer: >60
Glucose, Bld: 169 — ABNORMAL HIGH
Potassium: 4.1
Sodium: 141

## 2011-03-02 LAB — POCT HEMOGLOBIN-HEMACUE
Hemoglobin: 14.6
Operator id: 123881

## 2011-03-12 ENCOUNTER — Other Ambulatory Visit (INDEPENDENT_AMBULATORY_CARE_PROVIDER_SITE_OTHER): Payer: Self-pay | Admitting: *Deleted

## 2011-03-31 MED ORDER — SODIUM CHLORIDE 0.45 % IV SOLN
Freq: Once | INTRAVENOUS | Status: AC
  Administered 2011-04-01: 12:00:00 via INTRAVENOUS

## 2011-04-01 ENCOUNTER — Encounter (HOSPITAL_COMMUNITY): Payer: Self-pay | Admitting: *Deleted

## 2011-04-01 ENCOUNTER — Ambulatory Visit (HOSPITAL_COMMUNITY)
Admission: RE | Admit: 2011-04-01 | Discharge: 2011-04-01 | Disposition: A | Discharge status: Home / Self Care | Payer: Medicare Other | Source: Ambulatory Visit | Attending: Internal Medicine | Admitting: Internal Medicine

## 2011-04-01 ENCOUNTER — Encounter (HOSPITAL_COMMUNITY)
Admission: RE | Disposition: A | Discharge status: Home / Self Care | Payer: Self-pay | Source: Ambulatory Visit | Attending: Internal Medicine

## 2011-04-01 DIAGNOSIS — E119 Type 2 diabetes mellitus without complications: Secondary | ICD-10-CM | POA: Insufficient documentation

## 2011-04-01 DIAGNOSIS — I10 Essential (primary) hypertension: Secondary | ICD-10-CM | POA: Insufficient documentation

## 2011-04-01 DIAGNOSIS — R1310 Dysphagia, unspecified: Secondary | ICD-10-CM

## 2011-04-01 DIAGNOSIS — Z01812 Encounter for preprocedural laboratory examination: Secondary | ICD-10-CM | POA: Insufficient documentation

## 2011-04-01 DIAGNOSIS — Z79899 Other long term (current) drug therapy: Secondary | ICD-10-CM | POA: Insufficient documentation

## 2011-04-01 HISTORY — DX: Gastro-esophageal reflux disease without esophagitis: K21.9

## 2011-04-01 HISTORY — DX: Other seasonal allergic rhinitis: J30.2

## 2011-04-01 SURGERY — ESOPHAGOGASTRODUODENOSCOPY (EGD) WITH ESOPHAGEAL DILATION
Anesthesia: Moderate Sedation

## 2011-04-01 MED ORDER — MIDAZOLAM HCL 5 MG/5ML IJ SOLN
INTRAMUSCULAR | Status: AC
  Filled 2011-04-01: qty 10

## 2011-04-01 MED ORDER — MEPERIDINE HCL 50 MG/ML IJ SOLN
INTRAMUSCULAR | Status: AC
  Filled 2011-04-01: qty 1

## 2011-04-01 MED ORDER — SODIUM CHLORIDE 0.9 % IJ SOLN
INTRAMUSCULAR | Status: AC
  Filled 2011-04-01: qty 10

## 2011-04-01 MED ORDER — FENTANYL CITRATE 0.05 MG/ML IJ SOLN
INTRAMUSCULAR | Status: DC | PRN
  Administered 2011-04-01 (×4): 25 ug via INTRAVENOUS

## 2011-04-01 MED ORDER — MIDAZOLAM HCL 5 MG/5ML IJ SOLN
INTRAMUSCULAR | Status: AC
  Filled 2011-04-01: qty 5

## 2011-04-01 MED ORDER — FENTANYL CITRATE 0.05 MG/ML IJ SOLN
INTRAMUSCULAR | Status: AC
  Filled 2011-04-01: qty 2

## 2011-04-01 MED ORDER — STERILE WATER FOR IRRIGATION IR SOLN
Status: DC | PRN
  Administered 2011-04-01: 13:00:00

## 2011-04-01 MED ORDER — BUTAMBEN-TETRACAINE-BENZOCAINE 2-2-14 % EX AERO
INHALATION_SPRAY | CUTANEOUS | Status: DC | PRN
  Administered 2011-04-01: 1 via TOPICAL
  Administered 2011-04-01: 2 via TOPICAL

## 2011-04-01 MED ORDER — MIDAZOLAM HCL 5 MG/5ML IJ SOLN
INTRAMUSCULAR | Status: DC | PRN
  Administered 2011-04-01 (×6): 2 mg via INTRAVENOUS

## 2011-04-01 MED ORDER — PROMETHAZINE HCL 25 MG/ML IJ SOLN
INTRAMUSCULAR | Status: AC
  Filled 2011-04-01: qty 1

## 2011-04-01 NOTE — H&P (Signed)
[name] is an 65 y.o. female.   Chief Complaint: Patient is here for EGD and ED. HPI: Patient is 65 year old Caucasian female with multiple medical problems who presents with intermittent solid food dysphagia of approximately 2 years. She has experienced more recurrent symptoms in the last 6 months. She had an episode of food impaction on Father's Day her daughter was able to get her relief with Hamlich maneuver. She points to the lower sternal area as the site of bolus obstruction. He states her heartburn is well controlled with therapy. She has good appetite and denies weight loss melena or rectal bleeding. He still has intermittent diarrhea which is rather than what she has done in the past with intractable diarrhea felt to be due to IBS. Patient had barium pill study in September 2012 revealing a distal esophageal rings and/or strictures. She is therefore undergoing therapeutic EGD.  Past Medical History  Diagnosis Date  . Essential hypertension, benign   . Type 2 diabetes mellitus   . Diabetic neuropathy   . GI bleed     NSAID use  . TIA (transient ischemic attack)     1998  . Diverticulosis   . Urinary incontinence   . Degenerative joint disease   . Benign essential tremor   . Cardiac murmur     Reportedly for years  . Diabetes mellitus   . Chronic diarrhea   . Seasonal allergies   . GERD (gastroesophageal reflux disease)     Past Surgical History  Procedure Date  . Replacement total knee bilateral 2001, 2005  . Cholecystectomy   . Arthroscopic left shoulder surgery   . Appendectomy   . Colostomy with reversal   . Right elbow surgery   . Esophagogastroduodenoscopy     EGD 06/08/2008  . Colonoscopy 03/03/09  . Colonoscopy 09/16/2004    ROURK  . Esophagogastroduodenoscopy 09/16/04    DIAG EGD W/TCS  . Abdominal hysterectomy   . Tonsillectomy   . Colon surgery   . Breast surgery     lumpectomy X 2-benign    Family History  Problem Relation Age of Onset  .  Cancer Father     Leukemia  . Colon cancer Neg Hx    Social History:  reports that she has quit smoking. Her smoking use included Cigarettes. She has a 80 pack-year smoking history. She has never used smokeless tobacco. She reports that she does not drink alcohol or use illicit drugs.  Allergies:  Allergies  Allergen Reactions  . Codeine   . Morphine And Related   . Nsaids     Medications Prior to Admission  Medication Dose Route Frequency Provider Last Rate Last Dose  . 0.45 % sodium chloride infusion   Intravenous Once Malissa Hippo, MD 20 mL/hr at 04/01/11 1210    . meperidine (DEMEROL) 50 MG/ML injection           . midazolam (VERSED) 5 MG/5ML injection           . promethazine (PHENERGAN) 25 MG/ML injection           . sodium chloride 0.9 % injection            Medications Prior to Admission  Medication Sig Dispense Refill  . atorvastatin (LIPITOR) 10 MG tablet Take 10 mg by mouth daily.        . clonazePAM (KLONOPIN) 0.5 MG tablet Take 0.5 mg by mouth 2 (two) times daily.       . Coenzyme Q10 (  COQ10) 100 MG CAPS Take 1 capsule by mouth daily.        . cyanocobalamin (,VITAMIN B-12,) 1000 MCG/ML injection Inject 100 mcg into the muscle every 30 (thirty) days.        Marland Kitchen dicyclomine (BENTYL) 20 MG tablet Take 20 mg by mouth daily.       Marland Kitchen diltiazem (CARDIZEM CD) 180 MG 24 hr capsule Take 180 mg by mouth daily.        . diphenoxylate-atropine (LOMOTIL) 2.5-0.025 MG per tablet Take 1 tablet by mouth as needed. Ulcer stomach      . ergocalciferol (VITAMIN D2) 50000 UNITS capsule Take 50,000 Units by mouth once a week.        . fexofenadine (ALLEGRA) 180 MG tablet Take 180 mg by mouth daily.        Marland Kitchen glyBURIDE-metformin (GLUCOVANCE) 2.5-500 MG per tablet Take 1 tablet by mouth 2 (two) times daily.       Marland Kitchen lisinopril-hydrochlorothiazide (PRINZIDE,ZESTORETIC) 10-12.5 MG per tablet Take 1 tablet by mouth daily.        . meclizine (ANTIVERT) 25 MG tablet Take 25 mg by mouth 3 (three)  times daily as needed. For dizziness       . pantoprazole (PROTONIX) 40 MG tablet Take 40 mg by mouth daily.        . primidone (MYSOLINE) 50 MG tablet Take 200 mg by mouth 2 (two) times daily.       . propranolol (INDERAL LA) 120 MG 24 hr capsule Take 120 mg by mouth daily.        . traMADol (ULTRAM) 50 MG tablet Take 50 mg by mouth every 6 (six) hours as needed. For pain      . CYANOCOBALAMIN IJ Inject as directed.          Results for orders placed during the hospital encounter of 04/01/11 (from the past 48 hour(s))  GLUCOSE, CAPILLARY     Status: Abnormal   Collection Time   04/01/11 12:07 PM      Component Value Range Comment   Glucose-Capillary 112 (*) 70 - 99 (mg/dL)    Comment 1 Documented in Chart      No results found.  Review of Systems  Constitutional: Negative for weight loss.  Gastrointestinal: Negative for abdominal pain, constipation, blood in stool and melena. Diarrhea: intermittent diarrhea.    Blood pressure 157/76, temperature 97.8 F (36.6 C), temperature source Oral, resp. rate 12, height 5\' 1"  (1.549 m), weight 175 lb (79.379 kg), SpO2 96.00%. Physical Exam  Constitutional: She appears well-developed and well-nourished.  HENT:  Mouth/Throat: Oropharynx is clear and moist.  Eyes: Conjunctivae are normal. No scleral icterus.  Neck: No thyromegaly present.  GI: Soft. She exhibits no distension and no mass. There is no tenderness.       Small completely reducible incisional hernia in midline upper abdomen  Musculoskeletal: She exhibits no edema.  Lymphadenopathy:    She has no cervical adenopathy.  Neurological: She is alert.  Skin: Skin is warm and dry.     Assessment/Plan Solid food dysphagia. Abnormal barium study suggesting distal esophageal rings or strictures. Chronic GERD. EGD and ED.  Debra,NAJEEB Bowen 04/01/2011, 12:34 PM

## 2011-04-01 NOTE — Op Note (Signed)
EGD PROCEDURE REPORT  PATIENT:  [name]  MR#:  [id number] Birthdate:  [birth year], 65 y.o., female Endoscopist:  Dr. Malissa Hippo, MD Referred By:  Dr. Ignatius Specking, MD Procedure Date: 04/01/2011  Procedure:   EGD with ED.  Indications:  Patient is 65 year old Caucasian female with multiple medical problems including chronic GERD was maintained on a PPI with good control of her heartburn who now presents with solid food dysphagia and abnormal esophagogram. This study suggested mucosal irregularity in the distal esophagus with narrowing hold up of barium pill            Informed Consent:  The risks, benefits, alternatives & imponderables which include, but are not limited to, bleeding, infection, perforation, drug reaction and potential missed lesion have been reviewed.  The potential for biopsy, lesion removal, esophageal dilation, etc. have also been discussed.  Questions have been answered.  All parties agreeable.  Please see history & physical in medical record for more information.  Medications:  Fentanyl l 100  mcg IV Versed 12 mg IV Cetacaine spray topically for oropharyngeal anesthesia  Description of procedure:  The endoscope was introduced through the mouth and advanced to the second portion of the duodenum without difficulty or limitations. The mucosal surfaces were surveyed very carefully during advancement of the scope and upon withdrawal.  Findings:  Esophagus:  Esophageal mucosa at proximal middle and distal segment was normal. Distal segment was somewhat tortuous but no erosions ulcer or stricture noted GEJ:  38 cm Stomach:  Stomach was empty and distended very well with insufflation. Folds in the proximal stomach were normal. Mucosa at body, antrum, pyloric channel, angularis as well as fundus and cardia was also normal. Duodenum:  Normal bulbar and post bulbar mucosa.  Therapeutic/Diagnostic Maneuvers Performed:  Soft he does was dilated by passing 54 and 56  [place] Maloney dilators  to full insertion without any difficulty. Endoscope was passed again and no mucosal disruption noted to esophagus.  Complications:  None  Impression: Normal esophagogastroduodenoscopy. Esophagus dilated by passing 54 and 56 French Maloney dilators but no mucosal disruption induced. Suspect she may have esophageal motility disorder. Further evaluation would be considered if her dysphagia persists.  Recommendations:  Standard instructions given. Patient to call office with progress report next week.  REHMAN,NAJEEB U  04/01/2011  1:11 PM  CC: Dr. Ignatius Specking., MD, MD & Dr. Bonnetta Barry ref. provider found

## 2011-04-05 ENCOUNTER — Telehealth (INDEPENDENT_AMBULATORY_CARE_PROVIDER_SITE_OTHER): Payer: Self-pay | Admitting: Internal Medicine

## 2011-04-05 NOTE — Telephone Encounter (Signed)
Calling to report that she is swallowing  better and doing good.

## 2011-04-05 NOTE — Telephone Encounter (Signed)
Glad that she can swallow better.

## 2011-06-01 ENCOUNTER — Telehealth (INDEPENDENT_AMBULATORY_CARE_PROVIDER_SITE_OTHER): Payer: Self-pay | Admitting: *Deleted

## 2011-06-01 NOTE — Telephone Encounter (Signed)
LM asking for [name] to please give her a call. The return phone number is [phone].

## 2011-06-01 NOTE — Telephone Encounter (Signed)
Having some diarrhea. I advised her to try Imodium. If not better in a couple of days, will see in office

## 2011-07-19 ENCOUNTER — Other Ambulatory Visit (INDEPENDENT_AMBULATORY_CARE_PROVIDER_SITE_OTHER): Payer: Self-pay | Admitting: Internal Medicine

## 2011-07-19 DIAGNOSIS — K219 Gastro-esophageal reflux disease without esophagitis: Secondary | ICD-10-CM

## 2011-07-22 ENCOUNTER — Encounter (INDEPENDENT_AMBULATORY_CARE_PROVIDER_SITE_OTHER): Payer: Self-pay | Admitting: *Deleted

## 2011-08-05 ENCOUNTER — Ambulatory Visit (INDEPENDENT_AMBULATORY_CARE_PROVIDER_SITE_OTHER): Payer: Medicare Other | Admitting: Internal Medicine

## 2011-08-05 ENCOUNTER — Encounter (INDEPENDENT_AMBULATORY_CARE_PROVIDER_SITE_OTHER): Payer: Self-pay | Admitting: Internal Medicine

## 2011-08-05 DIAGNOSIS — K219 Gastro-esophageal reflux disease without esophagitis: Secondary | ICD-10-CM

## 2011-08-05 DIAGNOSIS — R1310 Dysphagia, unspecified: Secondary | ICD-10-CM | POA: Insufficient documentation

## 2011-08-05 DIAGNOSIS — R251 Tremor, unspecified: Secondary | ICD-10-CM | POA: Insufficient documentation

## 2011-08-05 MED ORDER — PANTOPRAZOLE SODIUM 40 MG PO TBEC: 40.0000 mg | DELAYED_RELEASE_TABLET | Freq: Two times a day (BID) | ORAL | Status: DC

## 2011-08-05 NOTE — Patient Instructions (Signed)
Protonix 40mg  BID. If insurance will not pay, may try Prilosec otc at evening meal. OV in 1 yrs.

## 2011-08-05 NOTE — Progress Notes (Signed)
Subjective:     Patient ID: [name], female   DOB: Dec 24, 1945, 66 y.o.   MRN: [id number]  [name] is a 66 yr old female here today for f/u.  She says her swallowing is 100% better. She is eating anything she wants as long as she chews it well.  Appetite is good. No weight. No abdominal pain. She is having a BM about 4 times one day, then 1 then next.  No melena or bright red rectal bleeding.  She says her acid reflux is not controlled.   03/2011 EGD/ED:Impression:  Normal esophagogastroduodenoscopy.  Esophagus dilated by passing 54 and 56 French Maloney dilators but no mucosal disruption induced.  Suspect she may have esophageal motility disorder.  Further evaluation would be considered if her dysphagia persists.   08/2004 EGD/Colonoscopy:  IMPRESSION: ESOPHAGOGASTRODUODENOSCOPY: Normal esophagus, stomach, D1  through D2.  COLONOSCOPY: Internal hemorrhoids and anal papilla, otherwise normal  rectum. Surgical anastomosis at 30 mg. Diverticula proximally and distally  to the anastomosis; however, the colonic mucosa appeared normal to the  cecum. Normal terminal ileum.   Review of Systems see hpi    Current Outpatient Prescriptions  Medication Sig Dispense Refill  . atorvastatin (LIPITOR) 10 MG tablet Take 10 mg by mouth daily.        . clonazePAM (KLONOPIN) 0.5 MG tablet Take 0.5 mg by mouth 2 (two) times daily.       . Coenzyme Q10 (COQ10) 100 MG CAPS Take 1 capsule by mouth daily.        . cyanocobalamin (,VITAMIN B-12,) 1000 MCG/ML injection Inject 100 mcg into the muscle every 30 (thirty) days.        . CYANOCOBALAMIN IJ Inject as directed.        . dicyclomine (BENTYL) 20 MG tablet Take 20 mg by mouth daily.       Marland Kitchen diltiazem (CARDIZEM CD) 180 MG 24 hr capsule Take 180 mg by mouth daily.        . diphenoxylate-atropine (LOMOTIL) 2.5-0.025 MG per tablet Take 1 tablet by mouth as needed. Ulcer stomach      . ergocalciferol (VITAMIN D2) 50000 UNITS capsule Take 50,000 Units by  mouth once a week.        . fexofenadine (ALLEGRA) 180 MG tablet Take 180 mg by mouth daily.        Marland Kitchen glyBURIDE-metformin (GLUCOVANCE) 2.5-500 MG per tablet Take 1 tablet by mouth 2 (two) times daily.       Marland Kitchen lisinopril-hydrochlorothiazide (PRINZIDE,ZESTORETIC) 10-12.5 MG per tablet Take 1 tablet by mouth daily.        . meclizine (ANTIVERT) 25 MG tablet Take 25 mg by mouth 3 (three) times daily as needed. For dizziness       . pantoprazole (PROTONIX) 40 MG tablet TAKE 1 TABLET BY MOUTH IN THE MORNING  30 tablet  11  . primidone (MYSOLINE) 50 MG tablet Take 200 mg by mouth 2 (two) times daily.       . propranolol (INDERAL LA) 120 MG 24 hr capsule Take 120 mg by mouth daily.        . traMADol (ULTRAM) 50 MG tablet Take 50 mg by mouth every 6 (six) hours as needed. For pain       Past Medical History  Diagnosis Date  . Essential hypertension, benign   . Type 2 diabetes mellitus   . Diabetic neuropathy   . GI bleed     NSAID use  . TIA (transient ischemic attack)  1998  . Diverticulosis   . Urinary incontinence   . Degenerative joint disease   . Benign essential tremor   . Cardiac murmur     Reportedly for years  . Diabetes mellitus   . Chronic diarrhea   . Seasonal allergies   . GERD (gastroesophageal reflux disease)    Past Surgical History  Procedure Date  . Replacement total knee bilateral 2001, 2005  . Cholecystectomy   . Arthroscopic left shoulder surgery   . Appendectomy   . Colostomy with reversal   . Right elbow surgery   . Esophagogastroduodenoscopy     EGD 06/08/2008  . Colonoscopy 03/03/09  . Colonoscopy 09/16/2004    ROURK  . Esophagogastroduodenoscopy 09/16/04    DIAG EGD W/TCS  . Abdominal hysterectomy   . Tonsillectomy   . Colon surgery   . Breast surgery     lumpectomy X 2-benign   History   Social History  . Marital Status: Married    Spouse Name: N/A    Number of Children: N/A  . Years of Education: N/A   Occupational History  . Disabled     Social History Main Topics  . Smoking status: Former Smoker -- 2.0 packs/day for 40 years    Types: Cigarettes  . Smokeless tobacco: Never Used   Comment: quit June 71f 1999  . Alcohol Use: No  . Drug Use: No  . Sexually Active: Not on file   Other Topics Concern  . Not on file   Social History Narrative  . No narrative on file   Family Status  Relation Status Death Age  . Father Deceased     Leukemia, pneumonia  . Mother Deceased     Alzheimer's  . Sister Alive     1 has CAD, blood disease.Other is an alcoholic   Allergies  Allergen Reactions  . Codeine   . Morphine And Related   . Nsaids     Objective:   Physical Exam Filed Vitals:   08/05/11 1435  Height: 5\' 2"  (1.575 m)  Weight: 177 lb (80.287 kg)  Alert and oriented. Skin warm and dry. Oral mucosa is moist.   . Sclera anicteric, conjunctivae is pink. Thyroid not enlarged. No cervical lymphadenopathy. Lungs clear. Heart regular rate and rhythm.  Abdomen is soft. Bowel sounds are positive. No hepatomegaly. No abdominal masses felt. No tenderness.  No edema to lower extremities. Patient is alert and oriented.       Assessment:    GERD which she says is not controlled at this time.  Dysphagia which has now resolved since her EGD/ED in November.    Plan:    Protonix 20mg  BID. OV in 1 year. Call with a PR in 2 weeks.

## 2011-11-25 ENCOUNTER — Other Ambulatory Visit (INDEPENDENT_AMBULATORY_CARE_PROVIDER_SITE_OTHER): Payer: Self-pay | Admitting: Internal Medicine

## 2011-11-25 NOTE — Telephone Encounter (Signed)
Patient will need ov prior to next refill. 

## 2012-01-14 DIAGNOSIS — H8110 Benign paroxysmal vertigo, unspecified ear: Secondary | ICD-10-CM | POA: Insufficient documentation

## 2012-06-10 ENCOUNTER — Other Ambulatory Visit (INDEPENDENT_AMBULATORY_CARE_PROVIDER_SITE_OTHER): Payer: Self-pay | Admitting: Internal Medicine

## 2012-07-08 ENCOUNTER — Other Ambulatory Visit (INDEPENDENT_AMBULATORY_CARE_PROVIDER_SITE_OTHER): Payer: Self-pay | Admitting: Internal Medicine

## 2012-08-07 ENCOUNTER — Encounter (INDEPENDENT_AMBULATORY_CARE_PROVIDER_SITE_OTHER): Payer: Self-pay | Admitting: Internal Medicine

## 2012-08-07 ENCOUNTER — Ambulatory Visit (INDEPENDENT_AMBULATORY_CARE_PROVIDER_SITE_OTHER): Payer: Medicare Other | Admitting: Internal Medicine

## 2012-08-07 VITALS — BP 124/66 | HR 78 | Temp 97.4°F | Resp 18 | Ht 62.0 in | Wt 172.7 lb

## 2012-08-07 DIAGNOSIS — K589 Irritable bowel syndrome without diarrhea: Secondary | ICD-10-CM

## 2012-08-07 DIAGNOSIS — K219 Gastro-esophageal reflux disease without esophagitis: Secondary | ICD-10-CM

## 2012-08-07 DIAGNOSIS — R1314 Dysphagia, pharyngoesophageal phase: Secondary | ICD-10-CM

## 2012-08-07 MED ORDER — DICYCLOMINE HCL 20 MG PO TABS: 20.0000 mg | ORAL_TABLET | Freq: Two times a day (BID) | ORAL | Status: DC

## 2012-08-07 MED ORDER — PANTOPRAZOLE SODIUM 40 MG PO TBEC: 40.0000 mg | DELAYED_RELEASE_TABLET | Freq: Two times a day (BID) | ORAL | Status: DC

## 2012-08-07 NOTE — Progress Notes (Signed)
Presenting complaint;  Follow for IBS GERD and dysphagia.  Subjective:  Patient is 67 year old Caucasian female who is here for yearly visit accompanied by her daughter Geoffery Spruce. She says her heartburn is well controlled with therapy. She continues to have dysphagia primarily with solids. She states she has quit eating meat. She has no difficulty with soft foods or liquids. She continues to have intermittent diarrhea and sometimes constipation. On most days she has 1-2 normal stools. Every now and then she may have 4 stools a day with urgency. She denies abdominal pain melena or rectal bleeding. Her appetite is good. Her weight is down by 5 pounds since her last visit. She says her weight is close to her baseline. She is not having any side effects or dicyclomine.  Current Medications: Current Outpatient Prescriptions  Medication Sig Dispense Refill  . carbidopa-levodopa (SINEMET IR) 25-100 MG per tablet Take 1 tablet by mouth 3 (three) times daily.      . clonazePAM (KLONOPIN) 0.5 MG tablet Take 0.5 mg by mouth 2 (two) times daily.       Marland Kitchen dicyclomine (BENTYL) 20 MG tablet TAKE 1 TABLET TWICE A DAY  60 tablet  3  . diltiazem (CARDIZEM CD) 180 MG 24 hr capsule Take 180 mg by mouth daily.        . diphenoxylate-atropine (LOMOTIL) 2.5-0.025 MG per tablet Take 1 tablet by mouth as needed. Ulcer stomach      . ergocalciferol (VITAMIN D2) 50000 UNITS capsule Take 50,000 Units by mouth once a week.        . glyBURIDE-metformin (GLUCOVANCE) 2.5-500 MG per tablet Take 1 tablet by mouth 2 (two) times daily.       Marland Kitchen levothyroxine (SYNTHROID, LEVOTHROID) 50 MCG tablet Take 50 mcg by mouth daily.      Marland Kitchen lisinopril-hydrochlorothiazide (PRINZIDE,ZESTORETIC) 10-12.5 MG per tablet Take 1 tablet by mouth daily.        Marland Kitchen loratadine (CLARITIN) 10 MG tablet Take 10 mg by mouth daily.      . pantoprazole (PROTONIX) 40 MG tablet TAKE 1 TABLET BY MOUTH TWICE A DAY BEFORE A MEAL  60 tablet  5  . primidone (MYSOLINE) 50  MG tablet Take 100 mg by mouth 2 (two) times daily.       . propranolol (INDERAL LA) 120 MG 24 hr capsule Take 120 mg by mouth daily.        . simvastatin (ZOCOR) 40 MG tablet Take 40 mg by mouth every evening.      . traMADol (ULTRAM) 50 MG tablet Take 50 mg by mouth every 6 (six) hours as needed. For pain       No current facility-administered medications for this visit.     Objective: Blood pressure 124/66, pulse 78, temperature 97.4 F (36.3 C), temperature source Oral, resp. rate 18, height 5\' 2"  (1.575 m), weight 172 lb 11.2 oz (78.336 kg). Patient is alert and in no acute distress. Conjunctiva is pink. Sclera is nonicteric Oropharyngeal mucosa is normal. No neck masses or thyromegaly noted. Cardiac exam with regular rhythm normal S1 and S2. No murmur or gallop noted. Lungs are clear to auscultation. Abdomen is full. Bowel sounds are normal. Abdomen is soft and nontender without organomegaly or masses.  No LE edema or clubbing noted.    Assessment:  #1. Irritable bowel syndrome. She is still having intermittent diarrhea but overall doing much better than she was a few years ago when she had incapacitating diarrhea affecting quality of her life. #  2. Dysphagia. Suspect she has esophageal motility disorder. No structural abnormality was noted to her esophagus on EGD of November 2012 and she did not respond to dilation. If this symptom worsens will consider high-resolution esophageal manometry and impedance study. #3.GERD. She is doing well with double dose PPI. She has nocturnal symptoms with once a day dosing.   Plan:  New prescription given for pantoprazole 40 mg by mouth twice a day 60 doses with 5 refills. New prescription given for dicyclomine 20 mg by mouth twice a day next he dozes with 5 refills she will try to drop dose to 10 mg twice a day and see how she does. Patient will call if dysphagia worsens. Office visit in one year.

## 2012-08-07 NOTE — Patient Instructions (Signed)
Notify if swallowing difficulty gets worse. Try decreasing dicyclomine to 10 mg twice daily or 20 mg every morning and 10 mg every evening

## 2012-09-29 ENCOUNTER — Other Ambulatory Visit: Payer: Self-pay | Admitting: Orthopedic Surgery

## 2012-10-02 ENCOUNTER — Encounter (HOSPITAL_BASED_OUTPATIENT_CLINIC_OR_DEPARTMENT_OTHER): Payer: Self-pay | Admitting: *Deleted

## 2012-10-02 ENCOUNTER — Other Ambulatory Visit: Payer: Self-pay

## 2012-10-02 ENCOUNTER — Encounter (HOSPITAL_COMMUNITY)
Admission: RE | Admit: 2012-10-02 | Discharge: 2012-10-02 | Disposition: A | Discharge status: Home / Self Care | Payer: Medicare Other | Source: Ambulatory Visit | Attending: Orthopedic Surgery | Admitting: Orthopedic Surgery

## 2012-10-02 LAB — BASIC METABOLIC PANEL
CO2: 27 mEq/L (ref 19–32)
Calcium: 9.6 mg/dL (ref 8.4–10.5)
Chloride: 99 mEq/L (ref 96–112)
Creatinine, Ser: 0.93 mg/dL (ref 0.50–1.10)
Glucose, Bld: 130 mg/dL — ABNORMAL HIGH (ref 70–99)

## 2012-10-02 LAB — HEMOGLOBIN: Hemoglobin: 13.1 g/dL (ref 12.0–15.0)

## 2012-10-02 NOTE — Progress Notes (Signed)
Pt added on for tomorrow-will go to [facility] for [facility]=ekg-faxed oder-to take am meds x no diabetes meds-bring all meds

## 2012-10-02 NOTE — H&P (Addendum)
[name] is an 67 y.o. female.   Chief Complaint: c/o chronic and progressive numbness and tingling of the left hand and chronic pain left medial elbow HPI: [name] returns for follow-up examination of a left median epicondylitis as well as left carpal tunnel syndrome.  Her numbness is persistent despite injection and splinting. She has persistent medial epicondylar pain despite injection. She is sent for MRI of her elbow.  Past Medical History  Diagnosis Date  . Essential hypertension, benign   . Type 2 diabetes mellitus   . Diabetic neuropathy   . GI bleed     NSAID use  . TIA (transient ischemic attack)     1998  . Diverticulosis   . Urinary incontinence   . Degenerative joint disease   . Benign essential tremor   . Diabetes mellitus   . Chronic diarrhea   . Seasonal allergies   . GERD (gastroesophageal reflux disease)   . Cardiac murmur     Reportedly for years-mild  . Shortness of breath   . Anxiety   . PONV (postoperative nausea and vomiting)     Past Surgical History  Procedure Laterality Date  . Replacement total knee bilateral  2001, 2005  . Cholecystectomy    . Arthroscopic left shoulder surgery    . Appendectomy    . Colostomy with reversal    . Right elbow surgery    . Esophagogastroduodenoscopy      EGD 06/08/2008  . Colonoscopy  03/03/09  . Colonoscopy  09/16/2004    ROURK  . Esophagogastroduodenoscopy  09/16/04    DIAG EGD W/TCS  . Abdominal hysterectomy    . Tonsillectomy    . Colon surgery    . Breast surgery      lumpectomy X 2-benign    Family History  Problem Relation Age of Onset  . Cancer Father     Leukemia  . Colon cancer Neg Hx    Social History:  reports that she quit smoking about 14 years ago. Her smoking use included Cigarettes. She has a 80 pack-year smoking history. She has never used smokeless tobacco. She reports that she does not drink alcohol or use illicit drugs.  Allergies:  Allergies  Allergen Reactions  .  Codeine   . Morphine And Related   . Nsaids   . Valium (Diazepam) Other (See Comments)    Patient states that this medication made her want to commit sucide    No prescriptions prior to admission    No results found for this or any previous visit (from the past 48 hour(s)).  No results found.   Pertinent items are noted in HPI.  Height 5\' 2"  (1.575 m), weight 78.019 kg (172 lb).  General appearance: alert Head: Normocephalic, without obvious abnormality Neck: supple, symmetrical, trachea midline Resp: clear to auscultation bilaterally Cardio: regular rate and rhythm faint systolic murmur worked up as benign tricuspid regurgitation GI: normal findings: bowel sounds normal Extremities: [name] can extend her elbow to a 15 degree flexion contracture. She has 4+ tenderness to direct palpation at the medial epicondyle. She has all the positive provocative signs of epicondylitis.   Review of  the MRI of her left elbow. This was performed at [facility] on 09-20-12. Her MRI revealed severe medial epicondylitis and on my review of the T2 images it appears that she has complete gap between her pronator origin and the epicondyle. Her ulnar nerve appears normal.   Electrodiagnostic studies of her median and ulnar nerves  performed by Dr. 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  [name] 10/02/2012, 2:57 PM   H&P documentation: 10/03/2012  -History and Physical Reviewed  -Patient has been re-examined  -No change in the plan of care  Debra Forster, MD

## 2012-10-03 ENCOUNTER — Ambulatory Visit (HOSPITAL_BASED_OUTPATIENT_CLINIC_OR_DEPARTMENT_OTHER): Payer: Medicare Other | Admitting: *Deleted

## 2012-10-03 ENCOUNTER — Encounter (HOSPITAL_BASED_OUTPATIENT_CLINIC_OR_DEPARTMENT_OTHER): Payer: Self-pay | Admitting: *Deleted

## 2012-10-03 ENCOUNTER — Ambulatory Visit (HOSPITAL_BASED_OUTPATIENT_CLINIC_OR_DEPARTMENT_OTHER)
Admission: RE | Admit: 2012-10-03 | Discharge: 2012-10-03 | Disposition: A | Discharge status: Home / Self Care | Payer: Medicare Other | Source: Ambulatory Visit | Attending: Orthopedic Surgery | Admitting: Orthopedic Surgery

## 2012-10-03 ENCOUNTER — Encounter (HOSPITAL_BASED_OUTPATIENT_CLINIC_OR_DEPARTMENT_OTHER)
Admission: RE | Disposition: A | Discharge status: Home / Self Care | Payer: Self-pay | Source: Ambulatory Visit | Attending: Orthopedic Surgery

## 2012-10-03 DIAGNOSIS — Z888 Allergy status to other drugs, medicaments and biological substances status: Secondary | ICD-10-CM | POA: Insufficient documentation

## 2012-10-03 DIAGNOSIS — G250 Essential tremor: Secondary | ICD-10-CM | POA: Insufficient documentation

## 2012-10-03 DIAGNOSIS — M7700 Medial epicondylitis, unspecified elbow: Secondary | ICD-10-CM | POA: Insufficient documentation

## 2012-10-03 DIAGNOSIS — M1990 Unspecified osteoarthritis, unspecified site: Secondary | ICD-10-CM | POA: Insufficient documentation

## 2012-10-03 DIAGNOSIS — E1142 Type 2 diabetes mellitus with diabetic polyneuropathy: Secondary | ICD-10-CM | POA: Insufficient documentation

## 2012-10-03 DIAGNOSIS — F411 Generalized anxiety disorder: Secondary | ICD-10-CM | POA: Insufficient documentation

## 2012-10-03 DIAGNOSIS — J4489 Other specified chronic obstructive pulmonary disease: Secondary | ICD-10-CM | POA: Insufficient documentation

## 2012-10-03 DIAGNOSIS — Z87891 Personal history of nicotine dependence: Secondary | ICD-10-CM | POA: Insufficient documentation

## 2012-10-03 DIAGNOSIS — E1149 Type 2 diabetes mellitus with other diabetic neurological complication: Secondary | ICD-10-CM | POA: Insufficient documentation

## 2012-10-03 DIAGNOSIS — G5600 Carpal tunnel syndrome, unspecified upper limb: Secondary | ICD-10-CM | POA: Insufficient documentation

## 2012-10-03 DIAGNOSIS — K5730 Diverticulosis of large intestine without perforation or abscess without bleeding: Secondary | ICD-10-CM | POA: Insufficient documentation

## 2012-10-03 DIAGNOSIS — K219 Gastro-esophageal reflux disease without esophagitis: Secondary | ICD-10-CM | POA: Insufficient documentation

## 2012-10-03 DIAGNOSIS — Z886 Allergy status to analgesic agent status: Secondary | ICD-10-CM | POA: Insufficient documentation

## 2012-10-03 DIAGNOSIS — R011 Cardiac murmur, unspecified: Secondary | ICD-10-CM | POA: Insufficient documentation

## 2012-10-03 DIAGNOSIS — I10 Essential (primary) hypertension: Secondary | ICD-10-CM | POA: Insufficient documentation

## 2012-10-03 DIAGNOSIS — R32 Unspecified urinary incontinence: Secondary | ICD-10-CM | POA: Insufficient documentation

## 2012-10-03 DIAGNOSIS — Z6831 Body mass index (BMI) 31.0-31.9, adult: Secondary | ICD-10-CM | POA: Insufficient documentation

## 2012-10-03 DIAGNOSIS — Z79899 Other long term (current) drug therapy: Secondary | ICD-10-CM | POA: Insufficient documentation

## 2012-10-03 DIAGNOSIS — Z8673 Personal history of transient ischemic attack (TIA), and cerebral infarction without residual deficits: Secondary | ICD-10-CM | POA: Insufficient documentation

## 2012-10-03 DIAGNOSIS — G5620 Lesion of ulnar nerve, unspecified upper limb: Secondary | ICD-10-CM | POA: Insufficient documentation

## 2012-10-03 DIAGNOSIS — J449 Chronic obstructive pulmonary disease, unspecified: Secondary | ICD-10-CM | POA: Insufficient documentation

## 2012-10-03 DIAGNOSIS — E669 Obesity, unspecified: Secondary | ICD-10-CM | POA: Insufficient documentation

## 2012-10-03 DIAGNOSIS — Z885 Allergy status to narcotic agent status: Secondary | ICD-10-CM | POA: Insufficient documentation

## 2012-10-03 HISTORY — PX: TENDON RECONSTRUCTION: SHX2487

## 2012-10-03 HISTORY — DX: Other specified postprocedural states: R11.2

## 2012-10-03 HISTORY — PX: CARPAL TUNNEL RELEASE: SHX101

## 2012-10-03 HISTORY — DX: Shortness of breath: R06.02

## 2012-10-03 HISTORY — DX: Anxiety disorder, unspecified: F41.9

## 2012-10-03 HISTORY — DX: Other specified postprocedural states: Z98.890

## 2012-10-03 LAB — GLUCOSE, CAPILLARY: Glucose-Capillary: 116 mg/dL — ABNORMAL HIGH (ref 70–99)

## 2012-10-03 SURGERY — RECONSTRUCTION, TENDON OR LIGAMENT, ELBOW
Anesthesia: General | Site: Wrist | Laterality: Left | Wound class: Clean

## 2012-10-03 MED ORDER — CEFAZOLIN SODIUM-DEXTROSE 2-3 GM-% IV SOLR
2.0000 g | INTRAVENOUS | Status: AC
  Administered 2012-10-03: 2 g via INTRAVENOUS

## 2012-10-03 MED ORDER — MIDAZOLAM HCL 2 MG/2ML IJ SOLN: 0.5000 mg | Freq: Once | INTRAMUSCULAR | Status: DC | PRN

## 2012-10-03 MED ORDER — PROMETHAZINE HCL 25 MG/ML IJ SOLN
6.2500 mg | INTRAMUSCULAR | Status: DC | PRN
  Administered 2012-10-03: 6.25 mg via INTRAVENOUS

## 2012-10-03 MED ORDER — PROPOFOL 10 MG/ML IV BOLUS
INTRAVENOUS | Status: DC | PRN
  Administered 2012-10-03: 130 mg via INTRAVENOUS

## 2012-10-03 MED ORDER — ROPIVACAINE HCL 5 MG/ML IJ SOLN
INTRAMUSCULAR | Status: DC | PRN
  Administered 2012-10-03: 2.5 mL

## 2012-10-03 MED ORDER — OXYCODONE HCL 5 MG PO TABS: 5.0000 mg | ORAL_TABLET | Freq: Once | ORAL | Status: DC | PRN

## 2012-10-03 MED ORDER — MEPERIDINE HCL 25 MG/ML IJ SOLN: 6.2500 mg | INTRAMUSCULAR | Status: DC | PRN

## 2012-10-03 MED ORDER — OXYCODONE HCL 5 MG/5ML PO SOLN: 5.0000 mg | Freq: Once | ORAL | Status: DC | PRN

## 2012-10-03 MED ORDER — SCOPOLAMINE 1 MG/3DAYS TD PT72
1.0000 | MEDICATED_PATCH | TRANSDERMAL | Status: DC
Start: 2012-10-03 — End: 2012-10-03
  Administered 2012-10-03: 1.5 mg via TRANSDERMAL

## 2012-10-03 MED ORDER — FENTANYL CITRATE 0.05 MG/ML IJ SOLN
25.0000 ug | INTRAMUSCULAR | Status: DC | PRN
  Administered 2012-10-03 (×2): 25 ug via INTRAVENOUS

## 2012-10-03 MED ORDER — LIDOCAINE HCL (CARDIAC) 20 MG/ML IV SOLN
INTRAVENOUS | Status: DC | PRN
  Administered 2012-10-03: 4 mg via INTRAVENOUS

## 2012-10-03 MED ORDER — LACTATED RINGERS IV SOLN
INTRAVENOUS | Status: DC
  Administered 2012-10-03 (×2): via INTRAVENOUS

## 2012-10-03 MED ORDER — CHLORHEXIDINE GLUCONATE 4 % EX LIQD: 60.0000 mL | Freq: Once | CUTANEOUS | Status: DC

## 2012-10-03 MED ORDER — TRAMADOL HCL 50 MG PO TABS
50.0000 mg | ORAL_TABLET | Freq: Four times a day (QID) | ORAL | Status: DC | PRN
Start: 2012-10-03 — End: 2014-11-19

## 2012-10-03 MED ORDER — ACETAMINOPHEN 10 MG/ML IV SOLN
1000.0000 mg | Freq: Once | INTRAVENOUS | Status: AC
  Administered 2012-10-03: 1000 mg via INTRAVENOUS

## 2012-10-03 MED ORDER — ONDANSETRON HCL 4 MG/2ML IJ SOLN
INTRAMUSCULAR | Status: DC | PRN
  Administered 2012-10-03: 4 mg via INTRAVENOUS

## 2012-10-03 MED ORDER — FENTANYL CITRATE 0.05 MG/ML IJ SOLN
INTRAMUSCULAR | Status: DC | PRN
  Administered 2012-10-03: 25 ug via INTRAVENOUS
  Administered 2012-10-03: 100 ug via INTRAVENOUS

## 2012-10-03 SURGICAL SUPPLY — 84 items
BANDAGE ADHESIVE 1X3 (GAUZE/BANDAGES/DRESSINGS) IMPLANT
BANDAGE CONFORM 3  STR LF (GAUZE/BANDAGES/DRESSINGS) IMPLANT
BANDAGE ELASTIC 3 VELCRO ST LF (GAUZE/BANDAGES/DRESSINGS) ×3 IMPLANT
BANDAGE ELASTIC 4 VELCRO ST LF (GAUZE/BANDAGES/DRESSINGS) ×3 IMPLANT
BANDAGE GAUZE ELAST BULKY 4 IN (GAUZE/BANDAGES/DRESSINGS) ×2 IMPLANT
BLADE MINI RND TIP GREEN BEAV (BLADE) ×3 IMPLANT
BLADE SURG 15 STRL LF DISP TIS (BLADE) ×4 IMPLANT
BLADE SURG 15 STRL SS (BLADE) ×6
BNDG CMPR 9X4 STRL LF SNTH (GAUZE/BANDAGES/DRESSINGS) ×2
BNDG ESMARK 4X9 LF (GAUZE/BANDAGES/DRESSINGS) ×3 IMPLANT
BRUSH SCRUB EZ PLAIN DRY (MISCELLANEOUS) ×3 IMPLANT
CLOTH BEACON ORANGE TIMEOUT ST (SAFETY) ×3 IMPLANT
CORDS BIPOLAR (ELECTRODE) ×3 IMPLANT
COVER MAYO STAND STRL (DRAPES) ×3 IMPLANT
COVER TABLE BACK 60X90 (DRAPES) ×3 IMPLANT
CUFF TOURNIQUET SINGLE 18IN (TOURNIQUET CUFF) ×1 IMPLANT
DECANTER SPIKE VIAL GLASS SM (MISCELLANEOUS) IMPLANT
DRAIN PENROSE 1/4X12 LTX STRL (WOUND CARE) IMPLANT
DRAPE EXTREMITY T 121X128X90 (DRAPE) ×3 IMPLANT
DRAPE INCISE IOBAN 66X45 STRL (DRAPES) IMPLANT
DRAPE OEC MINIVIEW 54X84 (DRAPES) IMPLANT
DRAPE SURG 17X23 STRL (DRAPES) ×3 IMPLANT
DRSG PAD ABDOMINAL 8X10 ST (GAUZE/BANDAGES/DRESSINGS) IMPLANT
DRSG TEGADERM 2-3/8X2-3/4 SM (GAUZE/BANDAGES/DRESSINGS) ×2 IMPLANT
DRSG TEGADERM 4X4.75 (GAUZE/BANDAGES/DRESSINGS) IMPLANT
GAUZE SPONGE 4X4 16PLY XRAY LF (GAUZE/BANDAGES/DRESSINGS) IMPLANT
GAUZE XEROFORM 1X8 LF (GAUZE/BANDAGES/DRESSINGS) IMPLANT
GLOVE BIO SURGEON STRL SZ 6.5 (GLOVE) ×2 IMPLANT
GLOVE BIOGEL M STRL SZ7.5 (GLOVE) ×2 IMPLANT
GLOVE BIOGEL PI IND STRL 7.0 (GLOVE) IMPLANT
GLOVE BIOGEL PI INDICATOR 7.0 (GLOVE) ×1
GLOVE ORTHO TXT STRL SZ7.5 (GLOVE) ×3 IMPLANT
GOWN BRE IMP PREV XXLGXLNG (GOWN DISPOSABLE) ×6 IMPLANT
GOWN PREVENTION PLUS XLARGE (GOWN DISPOSABLE) ×3 IMPLANT
KIT BIO-TENODESIS 3X8 DISP (MISCELLANEOUS)
KIT INSRT BABSR STRL DISP BTN (MISCELLANEOUS) IMPLANT
KWIRE 4.0 X .035IN (WIRE) ×1 IMPLANT
KWIRE 4.0 X .045IN (WIRE) IMPLANT
KWIRE 4.0 X .062IN (WIRE) IMPLANT
NDL KEITH (NEEDLE) IMPLANT
NDL KEITH SZ10 STRAIGHT (NEEDLE) IMPLANT
NDL SUT 6 .5 CRC .975X.05 MAYO (NEEDLE) IMPLANT
NEEDLE 27GAX1X1/2 (NEEDLE) ×1 IMPLANT
NEEDLE FISTULA 1/2 CIRCLE (NEEDLE) IMPLANT
NEEDLE KEITH (NEEDLE) ×3 IMPLANT
NEEDLE KEITH SZ10 STRAIGHT (NEEDLE) ×3 IMPLANT
NEEDLE MAYO TAPER (NEEDLE)
PACK BASIN DAY SURGERY FS (CUSTOM PROCEDURE TRAY) ×3 IMPLANT
PAD CAST 3X4 CTTN HI CHSV (CAST SUPPLIES) ×2 IMPLANT
PAD CAST 4YDX4 CTTN HI CHSV (CAST SUPPLIES) IMPLANT
PADDING CAST ABS 4INX4YD NS (CAST SUPPLIES) ×1
PADDING CAST ABS COTTON 4X4 ST (CAST SUPPLIES) ×2 IMPLANT
PADDING CAST COTTON 3X4 STRL (CAST SUPPLIES) ×3
PADDING CAST COTTON 4X4 STRL (CAST SUPPLIES) ×3
PASSER SUT SWANSON 36MM LOOP (INSTRUMENTS) IMPLANT
SLEEVE SCD COMPRESS KNEE MED (MISCELLANEOUS) ×1 IMPLANT
SLING ARM FOAM STRAP LRG (SOFTGOODS) IMPLANT
SPLINT PLASTER CAST XFAST 3X15 (CAST SUPPLIES) ×10 IMPLANT
SPLINT PLASTER XTRA FASTSET 3X (CAST SUPPLIES) ×5
SPONGE GAUZE 4X4 12PLY (GAUZE/BANDAGES/DRESSINGS) ×3 IMPLANT
STOCKINETTE 4X48 STRL (DRAPES) ×3 IMPLANT
STRIP CLOSURE SKIN 1/2X4 (GAUZE/BANDAGES/DRESSINGS) ×3 IMPLANT
SUT ETHIBOND 2 OS 4 DA (SUTURE) IMPLANT
SUT FIBERWIRE #2 38 T-5 BLUE (SUTURE)
SUT FIBERWIRE 2-0 18 17.9 3/8 (SUTURE) ×3
SUT FIBERWIRE 3-0 18 TAPR NDL (SUTURE)
SUT PROLENE 3 0 PS 2 (SUTURE) ×3 IMPLANT
SUT SILK 2 0 FS (SUTURE) IMPLANT
SUT VIC AB 2-0 SH 27 (SUTURE)
SUT VIC AB 2-0 SH 27XBRD (SUTURE) IMPLANT
SUT VIC AB 3-0 X1 27 (SUTURE) IMPLANT
SUT VIC AB 4-0 P-3 18XBRD (SUTURE) IMPLANT
SUT VIC AB 4-0 P3 18 (SUTURE) ×3
SUT VICRYL 4-0 PS2 18IN ABS (SUTURE) IMPLANT
SUTURE FIBERWR #2 38 T-5 BLUE (SUTURE) IMPLANT
SUTURE FIBERWR 2-0 18 17.9 3/8 (SUTURE) IMPLANT
SUTURE FIBERWR 3-0 18 TAPR NDL (SUTURE) IMPLANT
SYR 3ML 23GX1 SAFETY (SYRINGE) IMPLANT
SYR BULB 3OZ (MISCELLANEOUS) ×3 IMPLANT
SYR CONTROL 10ML LL (SYRINGE) ×1 IMPLANT
TOWEL OR 17X24 6PK STRL BLUE (TOWEL DISPOSABLE) ×6 IMPLANT
TRAY DSU PREP LF (CUSTOM PROCEDURE TRAY) ×3 IMPLANT
TUBE CONNECTING 20X1/4 (TUBING) IMPLANT
UNDERPAD 30X30 INCONTINENT (UNDERPADS AND DIAPERS) ×3 IMPLANT

## 2012-10-03 NOTE — Transfer of Care (Signed)
Immediate Anesthesia Transfer of Care Note  Patient: [name]  Procedure(s) Performed: Procedure(s): LEFT MEDIAL ELBOW RECONSTRUCTION (Left) CARPAL TUNNEL RELEASE (Left)  Patient Location: PACU  Anesthesia Type:General  Level of Consciousness: awake, alert  and oriented  Airway & Oxygen Therapy: Patient Spontanous Breathing and Patient connected to face mask oxygen  Post-op Assessment: Report given to PACU RN, Post -op Vital signs reviewed and stable and Patient moving all extremities  Post vital signs: Reviewed and stable  Complications: No apparent anesthesia complications

## 2012-10-03 NOTE — Brief Op Note (Signed)
10/03/2012  10:27 AM  PATIENT:  [name]  67 y.o. female  PRE-OPERATIVE DIAGNOSIS:  LEFT MEDIAL EPICONDYLITIS, LEFT CARPAL TUNNEL SYNDROME  POST-OPERATIVE DIAGNOSIS:  left medial epiconylitis, left carpal tunnel syndrome  PROCEDURE:  Procedure(s): LEFT MEDIAL ELBOW RECONSTRUCTION (Left) CARPAL TUNNEL RELEASE (Left)  SURGEON:  Surgeon(s) and Role:    * Wyn Forster., MD - Primary  PHYSICIAN ASSISTANT:   ASSISTANTS: surgical technician  ANESTHESIA:   general  EBL:  Total I/O In: 750 [I.V.:750] Out: -   BLOOD ADMINISTERED:none  DRAINS: none   LOCAL MEDICATIONS USED:  Ropivacaine wrist, palm and wound block proximal medial elbow  SPECIMEN:  Fragments of necrotic pronator origin  DISPOSITION OF SPECIMEN:  PATHOLOGY  COUNTS:  YES  TOURNIQUET:   Total Tourniquet Time Documented: Upper Arm (Left) - 43 minutes Total: Upper Arm (Left) - 43 minutes   DICTATION: .Other Dictation: Dictation Number [id number]  PLAN OF CARE: Discharge to home after PACU  PATIENT DISPOSITION:  PACU - hemodynamically stable.   Delay start of Pharmacological VTE agent (>24hrs) due to surgical blood loss or risk of bleeding: not applicable

## 2012-10-03 NOTE — Anesthesia Preprocedure Evaluation (Addendum)
Anesthesia Evaluation  Patient identified by MRN, date of birth, ID band Patient awake    Reviewed: Allergy & Precautions, H&P , NPO status , Patient's Chart, lab work & pertinent test results  History of Anesthesia Complications (+) PONV  Airway Mallampati: II TM Distance: >3 FB Neck ROM: Full    Dental  (+) Teeth Intact and Dental Advisory Given   Pulmonary shortness of breath, COPDformer smoker (quit '99),  breath sounds clear to auscultation  Pulmonary exam normal       Cardiovascular hypertension, Pt. on medications and Pt. on home beta blockers + Valvular Problems/Murmurs Rhythm:Regular Rate:Normal  '12 ECHO: EF 50-55%, normal LVF and valves '12 Myoview: no ischemia, normal perfusion, EF>65%   Neuro/Psych PSYCHIATRIC DISORDERS Anxiety Benign essential tremor TIA   GI/Hepatic Neg liver ROS, GERD-  Medicated and Controlled,  Endo/Other  diabetes (glu 116), Well Controlled, Type 2, Oral Hypoglycemic Agents  Renal/GU negative Renal ROS     Musculoskeletal   Abdominal (+) + obese,   Peds  Hematology negative hematology ROS (+)   Anesthesia Other Findings   Reproductive/Obstetrics                           Anesthesia Physical Anesthesia Plan  ASA: III  Anesthesia Plan: General   Post-op Pain Management:    Induction: Intravenous  Airway Management Planned: LMA  Additional Equipment:   Intra-op Plan:   Post-operative Plan:   Informed Consent: I have reviewed the patients History and Physical, chart, labs and discussed the procedure including the risks, benefits and alternatives for the proposed anesthesia with the patient or authorized representative who has indicated his/her understanding and acceptance.   Dental advisory given  Plan Discussed with: CRNA and Surgeon  Anesthesia Plan Comments: (Plan routine monitors, GA- LMA OK)        Anesthesia Quick Evaluation

## 2012-10-03 NOTE — Anesthesia Procedure Notes (Signed)
Procedure Name: LMA Insertion Date/Time: 10/03/2012 9:26 AM Performed by: Meyer Russel Pre-anesthesia Checklist: Patient identified, Emergency Drugs available, Suction available and Patient being monitored Patient Re-evaluated:Patient Re-evaluated prior to inductionOxygen Delivery Method: Circle System Utilized Preoxygenation: Pre-oxygenation with 100% oxygen Intubation Type: IV induction Ventilation: Mask ventilation without difficulty LMA: LMA inserted LMA Size: 4.0 Number of attempts: 1 Airway Equipment and Method: bite block Placement Confirmation: positive ETCO2 and breath sounds checked- equal and bilateral Tube secured with: Tape Dental Injury: Teeth and Oropharynx as per pre-operative assessment

## 2012-10-03 NOTE — Op Note (Signed)
[id number] 

## 2012-10-03 NOTE — Anesthesia Postprocedure Evaluation (Signed)
  Anesthesia Post-op Note  Patient: [name]  Procedure(s) Performed: Procedure(s): LEFT MEDIAL ELBOW RECONSTRUCTION (Left) CARPAL TUNNEL RELEASE (Left)  Patient Location: PACU  Anesthesia Type:General  Level of Consciousness: awake, alert , oriented and patient cooperative  Airway and Oxygen Therapy: Patient Spontanous Breathing  Post-op Pain: none  Post-op Assessment: Post-op Vital signs reviewed, Patient's Cardiovascular Status Stable, Respiratory Function Stable, Patent Airway, No signs of Nausea or vomiting and Pain level controlled  Post-op Vital Signs: Reviewed and stable  Complications: No apparent anesthesia complications

## 2012-10-04 ENCOUNTER — Encounter (HOSPITAL_BASED_OUTPATIENT_CLINIC_OR_DEPARTMENT_OTHER): Payer: Self-pay | Admitting: Orthopedic Surgery

## 2012-10-04 NOTE — Op Note (Signed)
[name], [name]              ACCOUNT NO.:  [id number]  MEDICAL RECORD NO.:  [id number]  LOCATION:                                 FACILITY:  PHYSICIAN:  Debra Fitch. Siham Bucaro, M.D. DATE OF BIRTH:  09-Apr-1946  DATE OF PROCEDURE:  10/03/2012 DATE OF DISCHARGE:                              OPERATIVE REPORT   PREOPERATIVE DIAGNOSIS: 1. Chronic entrapment neuropathy, median nerve, left carpal tunnel. 2. MRI documented severe flexor pronator origin tendinopathy with     rupture of pronator origin from medial epicondyle.  POSTOPERATIVE DIAGNOSIS:  Chronic left carpal tunnel syndrome with transligamentous motor branch identified and severe flexor pronator tendinopathy with large cavitary tendinopathy lesion identified.  OPERATION: 1. Release of left transverse carpal ligament and confirmation of     transligamentous motor branch to the thenar muscles. 2. Reconstruction of common extensor origin in the manner of Nirschl     with medial epicondylar drilling and debridement of necrotic tendon     followed by repair of pronator origin to the flexor carpi radialis     origin.  OPERATING SURGEON:  Debra Fitch. Marcques Wrightsman, M.D.  ASSISTANT:  Surgical technician.  ANESTHESIA:  General by LMA.  SUPERVISING ANESTHESIOLOGIST:  [name], M.D.  INDICATIONS:  [name] is a 67 year old woman, who has had a history of significant left hand numbness and medial epicondylar elbow pain.  She has failed measures for epicondylitis including stretching exercise, rest, steroid injection, and anti-inflammatory medication.  An MRI of the elbow documented cavitary tendinopathy of the flexor pronator origin.  With respect to her carpal tunnel syndrome, she has had numbness for years.  She has failed nonoperative measures including splinting, activity modification, and steroid injection.  She had electrodiagnostic studies confirming significant left carpal tunnel syndrome.  After informed  consent, she was brought to the operating room to address both predicament.  Preoperatively, she was interviewed by [name], anesthesia. [name] recommended proceeding with general anesthesia by LMA technique.  This was accepted by [name].  After informed consent, she was brought to the operating room at this time.  DESCRIPTION OF PROCEDURE:  [name] was brought to room #5 of the [facility] and placed in supine position on the operating table.  In the holding area, [name] had provided detailed anesthesia informed consent and [name] selected general anesthesia by LMA technique.  In room #5, under [name] direct supervision, general anesthesia by LMA technique was induced followed by routine Betadine scrub and paint of the left upper extremity high above the elbow.  A pneumatic tourniquet was applied to the proximal brachium.  Following exsanguination of the left arm with Esmarch bandage, the pneumatic tourniquet was inflated to 250 mmHg.  [name] multiple drug allergies were noted and a routine surgical time-out was accomplished.  Procedure commenced with a short incision in the line of the ring finger of the palm.  Subcutaneous tissues were carefully divided around the palmar fascia.  There was noted to be very atypical origin of the flexor pollicis brevis muscle that was crossing to the hypothenar muscles distally. This immediately alerted myself to be on look out for transligamentous motor  branch or other motor branch variation.  With great care, the transverse carpal ligament was separated from the skin proximally and care was taken to sound the distal common sensory branch of the median nerve.  Transcarpal ligament released in its proximal half and the volar forearm fascia was released with scissors in the distal forearm under direct vision.  With great care, we worked our way through the aberrant  muscle teasing apart the fibers of the muscle and subsequently teasing fiber by fiber in the distal margin of the transverse carpal ligament and indeed identified a proximal transligamentous motor branch.  This was carefully preserved.  The canal was fully decompressed, and the contents of the carpal canal inspected.  There was noted to be a thickened ulnar bursa, but otherwise no pathology recognized.  Bleeding points along the margin of the released ligament were electrocauterized with bipolar current followed by repair of the skin with intradermal 3-0 Prolene suture. A Steri-Strip was applied.  Attention was then directed to the medial elbow.  A transverse incision was fashioned following the antecubital crease.  Due to [name]'s extremely mesomorphic habitus, the incision was approximately 5 cm long. More than 4 cm of subcutaneous adipose tissue was spread carefully and identified the medial brachial cutaneous nerves and antebrachial cutaneous nerves.  The flexor pronator origin was identified and found to be very swollen and edematous with telangiectatic changes.  The ulnar nerve was identified by palpation, performed a longitudinal incision through the fibers of the flexor pronator origin and identified a deep cavity measuring approximately 1.5 cm x 8 mm in width, filled with edematous fluid and neovascularization.  The pronator fibers remaining were released from the epicondyle.  A micro rongeur was used to remove all necrotic tendon.  The epicondyle was then drilled more than 20 times with a 0.035 inch Kirschner wire followed by repair of the pronator origin to the flexor carpi radialis origin.  The repair was accomplished with 3-0 Vicryl suture.  Hemostasis was achieved followed by infiltration of 0.5% ropivacaine for postoperative analgesia.  The wound was closed in layers with subcutaneous 3-0 Vicryl and intradermal 3-0 Prolene segmental sutures. Steri-Strips  were applied, followed by dressing the wound with sterile gauze and Tegaderm. Her of the risks, was mobilized in 20 degrees dorsiflexion with a volar plaster splint.  Ace wrap was applied from mid brachium distally for compression.  There were no apparent complications.     [name], M.D.     RVS/MEDQ  D:  10/03/2012  T:  10/04/2012  Job:  [id number]  cc:   Dr. Sherril Croon Dr. Willeen Cass

## 2013-02-03 ENCOUNTER — Other Ambulatory Visit (INDEPENDENT_AMBULATORY_CARE_PROVIDER_SITE_OTHER): Payer: Self-pay | Admitting: Internal Medicine

## 2013-05-09 ENCOUNTER — Encounter (INDEPENDENT_AMBULATORY_CARE_PROVIDER_SITE_OTHER): Payer: Self-pay | Admitting: *Deleted

## 2013-05-22 ENCOUNTER — Other Ambulatory Visit (INDEPENDENT_AMBULATORY_CARE_PROVIDER_SITE_OTHER): Payer: Self-pay | Admitting: Internal Medicine

## 2013-08-07 ENCOUNTER — Encounter (INDEPENDENT_AMBULATORY_CARE_PROVIDER_SITE_OTHER): Payer: Self-pay | Admitting: Internal Medicine

## 2013-08-07 ENCOUNTER — Ambulatory Visit (INDEPENDENT_AMBULATORY_CARE_PROVIDER_SITE_OTHER): Payer: Medicare Other | Admitting: Internal Medicine

## 2013-08-07 VITALS — BP 122/70 | HR 72 | Temp 98.2°F | Ht 62.0 in | Wt 178.2 lb

## 2013-08-07 DIAGNOSIS — K219 Gastro-esophageal reflux disease without esophagitis: Secondary | ICD-10-CM

## 2013-08-07 DIAGNOSIS — R1310 Dysphagia, unspecified: Secondary | ICD-10-CM

## 2013-08-07 NOTE — Progress Notes (Signed)
Subjective:     Patient ID: [name], female   DOB: October 29, 1945, 68 y.o.   MRN: [id number]  HPI Here today for f/u of her chronic GERD. She was last seen 1 yr ago. She says she continues to have some dysphagia. She does tell me foods feel like they are slow to go down. She will get up and stretch and the bolus will go down. She is avoid steaks. She eats in small bites. She sometimes strangles on water. Appetite is good. No weight loss.  She has lower abdominal pain occasionally. GERD is well controlled with Protonix.  She usually ha a BM 2-6 times a day.  She says her hemorrhoids bleed occasionally.     04/01/2011 EGD/ED: solid food dysphagia.  Impression:  Normal esophagogastroduodenoscopy.  Esophagus dilated by passing 54 and 56 French Maloney dilators but no mucosal disruption induced.  Suspect she may have esophageal motility disorder.  Further evaluation would be considered if her dysphagia persists.  Review of Systems    Past Medical History  Diagnosis Date  . Essential hypertension, benign   . Type 2 diabetes mellitus   . Diabetic neuropathy   . GI bleed     NSAID use  . TIA (transient ischemic attack)     1998  . Diverticulosis   . Urinary incontinence   . Degenerative joint disease   . Benign essential tremor   . Diabetes mellitus   . Chronic diarrhea   . Seasonal allergies   . GERD (gastroesophageal reflux disease)   . Cardiac murmur     Reportedly for years-mild  . Shortness of breath   . Anxiety   . PONV (postoperative nausea and vomiting)     Past Surgical History  Procedure Laterality Date  . Replacement total knee bilateral  2001, 2005  . Cholecystectomy    . Arthroscopic left shoulder surgery    . Appendectomy    . Colostomy with reversal    . Right elbow surgery    . Esophagogastroduodenoscopy      EGD 06/08/2008  . Colonoscopy  03/03/09  . Colonoscopy  09/16/2004    ROURK  . Esophagogastroduodenoscopy  09/16/04    DIAG EGD W/TCS  .  Abdominal hysterectomy    . Tonsillectomy    . Colon surgery    . Breast surgery      lumpectomy X 2-benign  . Tendon reconstruction Left 10/03/2012    Procedure: LEFT MEDIAL ELBOW RECONSTRUCTION;  Surgeon: Cammie Sickle., MD;  Location: [facility];  Service: Orthopedics;  Laterality: Left;  . Carpal tunnel release Left 10/03/2012    Procedure: CARPAL TUNNEL RELEASE;  Surgeon: Cammie Sickle., MD;  Location: [facility];  Service: Orthopedics;  Laterality: Left;    Allergies  Allergen Reactions  . Codeine     "Increases heart rate"   . Morphine And Related     Hallucinations   . Nsaids     Hx  GI bleed  . Valium [Diazepam] Other (See Comments)    Patient states that this medication made her want to commit sucide    Current Outpatient Prescriptions on File Prior to Visit  Medication Sig Dispense Refill  . cetirizine (ZYRTEC) 10 MG tablet Take 10 mg by mouth daily.      . clonazePAM (KLONOPIN) 0.5 MG tablet Take 0.5 mg by mouth 2 (two) times daily.       Marland Kitchen dicyclomine (BENTYL) 20 MG tablet Take 20 mg  by mouth 2 (two) times daily before a meal.      . diltiazem (CARDIZEM CD) 180 MG 24 hr capsule Take 180 mg by mouth daily.        . diphenoxylate-atropine (LOMOTIL) 2.5-0.025 MG per tablet Take 1 tablet by mouth as needed. Ulcer stomach      . ergocalciferol (VITAMIN D2) 50000 UNITS capsule Take 50,000 Units by mouth once a week.        . glyBURIDE-metformin (GLUCOVANCE) 2.5-500 MG per tablet Take 1 tablet by mouth 2 (two) times daily.       Marland Kitchen levothyroxine (SYNTHROID, LEVOTHROID) 50 MCG tablet Take 50 mcg by mouth daily.      Marland Kitchen lisinopril-hydrochlorothiazide (PRINZIDE,ZESTORETIC) 10-12.5 MG per tablet Take 1 tablet by mouth daily.        Marland Kitchen loratadine (CLARITIN) 10 MG tablet Take 10 mg by mouth daily.      . pantoprazole (PROTONIX) 40 MG tablet Take 40 mg by mouth 2 (two) times daily before a meal.      . pantoprazole (PROTONIX) 40 MG tablet TAKE 1  TABLET BY MOUTH TWICE A DAY BEFORE A MEAL  60 tablet  5  . primidone (MYSOLINE) 50 MG tablet Take 100 mg by mouth 2 (two) times daily.       . propranolol (INDERAL LA) 120 MG 24 hr capsule Take 120 mg by mouth daily.       . simvastatin (ZOCOR) 40 MG tablet Take 40 mg by mouth every evening.      . traMADol (ULTRAM) 50 MG tablet Take 50 mg by mouth every 6 (six) hours as needed. For pain      . traMADol (ULTRAM) 50 MG tablet Take 1 tablet (50 mg total) by mouth every 6 (six) hours as needed for pain.  30 tablet  0   No current facility-administered medications on file prior to visit.        Objective:   Physical Exam  Filed Vitals:   08/07/13 1428  BP: 122/70  Pulse: 72  Temp: 98.2 F (36.8 C)  Height: 5\' 2"  (1.575 m)  Weight: 178 lb 3.2 oz (80.831 kg)  Alert and oriented. Skin warm and dry. Oral mucosa is moist.   . Sclera anicteric, conjunctivae is pink. Thyroid not enlarged. No cervical lymphadenopathy. Lungs clear. Heart regular rate and rhythm.  Abdomen is soft. Bowel sounds are positive. No hepatomegaly. No abdominal masses felt. No tenderness.  No edema to lower extremities.        Assessment:     Solild food dysphagia. Symtoms have not worsened. Remained the same. She is avoiding steaks.   She has trouble swallowing liquids which is a motility problem.  Plan:    Continue the Protonix BID. OV in one year.

## 2013-08-07 NOTE — Patient Instructions (Signed)
Continue Protonix BID. OV in 1 yr. If any problems call our office.

## 2013-10-16 ENCOUNTER — Ambulatory Visit (INDEPENDENT_AMBULATORY_CARE_PROVIDER_SITE_OTHER): Payer: Medicare Other | Admitting: Internal Medicine

## 2013-10-16 ENCOUNTER — Encounter (INDEPENDENT_AMBULATORY_CARE_PROVIDER_SITE_OTHER): Payer: Self-pay | Admitting: Internal Medicine

## 2013-10-16 ENCOUNTER — Other Ambulatory Visit (INDEPENDENT_AMBULATORY_CARE_PROVIDER_SITE_OTHER): Payer: Self-pay | Admitting: Internal Medicine

## 2013-10-16 VITALS — BP 122/64 | HR 56 | Temp 97.7°F | Ht 62.0 in | Wt 170.6 lb

## 2013-10-16 DIAGNOSIS — R197 Diarrhea, unspecified: Secondary | ICD-10-CM

## 2013-10-16 LAB — CBC WITH DIFFERENTIAL/PLATELET
BASOS ABS: 0 10*3/uL (ref 0.0–0.1)
BASOS PCT: 0 % (ref 0–1)
EOS ABS: 0.6 10*3/uL (ref 0.0–0.7)
Eosinophils Relative: 6 % — ABNORMAL HIGH (ref 0–5)
HCT: 38.5 % (ref 36.0–46.0)
HEMOGLOBIN: 13.3 g/dL (ref 12.0–15.0)
Lymphocytes Relative: 15 % (ref 12–46)
Lymphs Abs: 1.4 10*3/uL (ref 0.7–4.0)
MCH: 27.5 pg (ref 26.0–34.0)
MCHC: 34.5 g/dL (ref 30.0–36.0)
MCV: 79.7 fL (ref 78.0–100.0)
MONOS PCT: 9 % (ref 3–12)
Monocytes Absolute: 0.8 10*3/uL (ref 0.1–1.0)
NEUTROS ABS: 6.5 10*3/uL (ref 1.7–7.7)
NEUTROS PCT: 70 % (ref 43–77)
PLATELETS: 280 10*3/uL (ref 150–400)
RBC: 4.83 MIL/uL (ref 3.87–5.11)
RDW: 17.9 % — AB (ref 11.5–15.5)
WBC: 9.3 10*3/uL (ref 4.0–10.5)

## 2013-10-16 LAB — COMPREHENSIVE METABOLIC PANEL
ALK PHOS: 64 U/L (ref 39–117)
ALT: 25 U/L (ref 0–35)
AST: 25 U/L (ref 0–37)
Albumin: 4 g/dL (ref 3.5–5.2)
BILIRUBIN TOTAL: 0.4 mg/dL (ref 0.2–1.2)
BUN: 18 mg/dL (ref 6–23)
CO2: 29 mEq/L (ref 19–32)
Calcium: 9.9 mg/dL (ref 8.4–10.5)
Chloride: 106 mEq/L (ref 96–112)
Creat: 1.1 mg/dL (ref 0.50–1.10)
GLUCOSE: 115 mg/dL — AB (ref 70–99)
Potassium: 4.2 mEq/L (ref 3.5–5.3)
SODIUM: 143 meq/L (ref 135–145)
TOTAL PROTEIN: 6.5 g/dL (ref 6.0–8.3)

## 2013-10-16 MED ORDER — DICYCLOMINE HCL 10 MG PO CAPS: 10.0000 mg | ORAL_CAPSULE | Freq: Two times a day (BID) | ORAL | Status: DC

## 2013-10-16 NOTE — Patient Instructions (Signed)
GI pathogen. Dicyclomine 10mg  BID. OV in 1 months.  PR in 2 weeks.

## 2013-10-16 NOTE — Progress Notes (Signed)
Subjective:     Patient ID: [name], female   DOB: [birth year], 68 y.o.   MRN: [id number]  HPI [name] is a 68yr old female presenting today with c/o diarrhea. She says she has had diarrhea x 2 weeks. She says she is having 6-8 stools a day. She says she is wearing depends at night. There has been no recent antibiotics.  Stopped Synthroid medications 2 months ago.  There has been no fever. She has a hx of diarrhea in the past. She says her stools are foul smelling.  She was last seen in March of this for chronic GERD.  Appetite is okay. She has lost about 7.5 pounds since her last visit in March.   Hx of solid food dysphagia.  No urinary frequency or burning. 08/07/2013   04/01/2011 EGD/ED: solid food dysphagia.  Impression:  Normal esophagogastroduodenoscopy.  Esophagus dilated by passing 54 and 56 French Maloney dilators but no mucosal disruption induced.  Suspect she may have esophageal motility disorder.  Further evaluation would be considered if her dysphagia persists.   Review of Systems Past Medical History  Diagnosis Date  . Essential hypertension, benign   . Type 2 diabetes mellitus   . Diabetic neuropathy   . GI bleed     NSAID use  . TIA (transient ischemic attack)     1998  . Diverticulosis   . Urinary incontinence   . Degenerative joint disease   . Benign essential tremor   . Diabetes mellitus   . Chronic diarrhea   . Seasonal allergies   . GERD (gastroesophageal reflux disease)   . Cardiac murmur     Reportedly for years-mild  . Shortness of breath   . Anxiety   . PONV (postoperative nausea and vomiting)     Past Surgical History  Procedure Laterality Date  . Replacement total knee bilateral  2001, 2005  . Cholecystectomy    . Arthroscopic left shoulder surgery    . Appendectomy    . Colostomy with reversal    . Right elbow surgery    . Esophagogastroduodenoscopy      EGD 06/08/2008  . Colonoscopy  03/03/09  . Colonoscopy  09/16/2004    ROURK  .  Esophagogastroduodenoscopy  09/16/04    DIAG EGD W/TCS  . Abdominal hysterectomy    . Tonsillectomy    . Colon surgery    . Breast surgery      lumpectomy X 2-benign  . Tendon reconstruction Left 10/03/2012    Procedure: LEFT MEDIAL ELBOW RECONSTRUCTION;  Surgeon: Cammie Sickle., MD;  Location: [facility];  Service: Orthopedics;  Laterality: Left;  . Carpal tunnel release Left 10/03/2012    Procedure: CARPAL TUNNEL RELEASE;  Surgeon: Cammie Sickle., MD;  Location: [facility];  Service: Orthopedics;  Laterality: Left;    Allergies  Allergen Reactions  . Codeine     "Increases heart rate"   . Morphine And Related     Hallucinations   . Nsaids     Hx  GI bleed  . Valium [Diazepam] Other (See Comments)    Patient states that this medication made her want to commit sucide    Current Outpatient Prescriptions on File Prior to Visit  Medication Sig Dispense Refill  . cetirizine (ZYRTEC) 10 MG tablet Take 10 mg by mouth daily.      . clonazePAM (KLONOPIN) 0.5 MG tablet Take 0.5 mg by mouth 2 (two) times daily.       Marland Kitchen  diltiazem (CARDIZEM CD) 180 MG 24 hr capsule Take 180 mg by mouth daily.        . diphenoxylate-atropine (LOMOTIL) 2.5-0.025 MG per tablet Take 1 tablet by mouth as needed. Ulcer stomach      . ergocalciferol (VITAMIN D2) 50000 UNITS capsule Take 50,000 Units by mouth once a week.        . glyBURIDE-metformin (GLUCOVANCE) 2.5-500 MG per tablet Take 1 tablet by mouth 2 (two) times daily.       Marland Kitchen lisinopril-hydrochlorothiazide (PRINZIDE,ZESTORETIC) 10-12.5 MG per tablet Take 1 tablet by mouth daily.        Marland Kitchen loratadine (CLARITIN) 10 MG tablet Take 10 mg by mouth daily.      . pantoprazole (PROTONIX) 40 MG tablet Take 40 mg by mouth 2 (two) times daily before a meal.      . propranolol (INDERAL LA) 120 MG 24 hr capsule Take 120 mg by mouth daily.       . simvastatin (ZOCOR) 40 MG tablet Take 40 mg by mouth every evening.      . traMADol  (ULTRAM) 50 MG tablet Take 50 mg by mouth every 6 (six) hours as needed. For pain      . traMADol (ULTRAM) 50 MG tablet Take 1 tablet (50 mg total) by mouth every 6 (six) hours as needed for pain.  30 tablet  0  . levothyroxine (SYNTHROID, LEVOTHROID) 50 MCG tablet Take 50 mcg by mouth daily.       No current facility-administered medications on file prior to visit.        Objective:   Physical Exam  Filed Vitals:   10/16/13 1503  BP: 122/64  Pulse: 56  Temp: 97.7 F (36.5 C)  Height: 5\' 2"  (1.575 m)  Weight: 170 lb 9.6 oz (77.384 kg)    Alert and oriented. Skin warm and dry. Oral mucosa is moist.   . Sclera anicteric, conjunctivae is pink. Thyroid not enlarged. No cervical lymphadenopathy. Lungs clear. Heart regular rate and rhythm.  Abdomen is soft. Bowel sounds are positive. No hepatomegaly. No abdominal masses felt. Tenderness all over  No edema to lower extremities.       Assessment:     Diarrhea. Hx of same. Would like to rule out an infectious process.     Plan:     GI pathogen. Dicyclomine 10mg  BID.  CBC, cmet

## 2013-10-18 LAB — GASTROINTESTINAL PATHOGEN PANEL PCR
C. difficile Tox A/B, PCR: NEGATIVE
CRYPTOSPORIDIUM, PCR: NEGATIVE
Campylobacter, PCR: NEGATIVE
E coli (ETEC) LT/ST PCR: NEGATIVE
E coli (STEC) stx1/stx2, PCR: NEGATIVE
E coli 0157, PCR: NEGATIVE
GIARDIA LAMBLIA, PCR: NEGATIVE
NOROVIRUS, PCR: NEGATIVE
ROTAVIRUS, PCR: NEGATIVE
SALMONELLA, PCR: NEGATIVE
SHIGELLA, PCR: POSITIVE — AB

## 2013-10-19 ENCOUNTER — Other Ambulatory Visit (INDEPENDENT_AMBULATORY_CARE_PROVIDER_SITE_OTHER): Payer: Self-pay | Admitting: Internal Medicine

## 2013-10-19 DIAGNOSIS — A039 Shigellosis, unspecified: Secondary | ICD-10-CM

## 2013-10-19 MED ORDER — CIPROFLOXACIN HCL 500 MG PO TABS: 500.0000 mg | ORAL_TABLET | Freq: Two times a day (BID) | ORAL | Status: DC

## 2013-10-19 NOTE — Telephone Encounter (Signed)
Rx for Cipro eprescribed to her pharmacy

## 2013-10-23 ENCOUNTER — Ambulatory Visit (INDEPENDENT_AMBULATORY_CARE_PROVIDER_SITE_OTHER): Payer: Medicare Other | Admitting: Internal Medicine

## 2013-10-23 ENCOUNTER — Encounter (INDEPENDENT_AMBULATORY_CARE_PROVIDER_SITE_OTHER): Payer: Self-pay | Admitting: Internal Medicine

## 2013-10-23 VITALS — BP 154/62 | HR 60 | Temp 97.4°F | Ht 62.0 in | Wt 171.1 lb

## 2013-10-23 DIAGNOSIS — K589 Irritable bowel syndrome without diarrhea: Secondary | ICD-10-CM

## 2013-10-23 DIAGNOSIS — A039 Shigellosis, unspecified: Secondary | ICD-10-CM

## 2013-10-23 MED ORDER — CIPROFLOXACIN HCL 500 MG PO TABS: 500.0000 mg | ORAL_TABLET | Freq: Two times a day (BID) | ORAL | Status: DC

## 2013-10-23 NOTE — Progress Notes (Signed)
Subjective:     Patient ID: [name], female   DOB: 27-Jun-1945, 68 y.o.   MRN: [id number]  HPI Here today for f/u. Seen on the 25th of May. She presented with diarrhea. She had had diarrhea x 2 weeks. She was having 6-8 stools a day. She had a positive GI pathogen (Shigella). She was covered with Cipro 500mg  BID x 3 days. She has started her Synthroid again. Today, she tells me she feels like crap. She c/o abdominal pain. She had 2 stools today and were loose.  Appetite is not okay. She has not lost any weight. No fever.  She tells me she felt good while on the antibiotic. Hx of chronic GERD and IBS./  08/07/2013  04/01/2011 EGD/ED: solid food dysphagia.  Impression:  Normal esophagogastroduodenoscopy.  Esophagus dilated by passing 54 and 56 French Maloney dilators but no mucosal disruption induced.  Suspect she may have esophageal motility disorder.  Further evaluation would be considered if her dysphagia persists.         Review of Systems Past Medical History  Diagnosis Date  . Essential hypertension, benign   . Type 2 diabetes mellitus   . Diabetic neuropathy   . GI bleed     NSAID use  . TIA (transient ischemic attack)     1998  . Diverticulosis   . Urinary incontinence   . Degenerative joint disease   . Benign essential tremor   . Diabetes mellitus   . Chronic diarrhea   . Seasonal allergies   . GERD (gastroesophageal reflux disease)   . Cardiac murmur     Reportedly for years-mild  . Shortness of breath   . Anxiety   . PONV (postoperative nausea and vomiting)     Past Surgical History  Procedure Laterality Date  . Replacement total knee bilateral  2001, 2005  . Cholecystectomy    . Arthroscopic left shoulder surgery    . Appendectomy    . Colostomy with reversal    . Right elbow surgery    . Esophagogastroduodenoscopy      EGD 06/08/2008  . Colonoscopy  03/03/09  . Colonoscopy  09/16/2004    ROURK  . Esophagogastroduodenoscopy  09/16/04    DIAG  EGD W/TCS  . Abdominal hysterectomy    . Tonsillectomy    . Colon surgery    . Breast surgery      lumpectomy X 2-benign  . Tendon reconstruction Left 10/03/2012    Procedure: LEFT MEDIAL ELBOW RECONSTRUCTION;  Surgeon: Cammie Sickle., MD;  Location: [facility];  Service: Orthopedics;  Laterality: Left;  . Carpal tunnel release Left 10/03/2012    Procedure: CARPAL TUNNEL RELEASE;  Surgeon: Cammie Sickle., MD;  Location: [facility];  Service: Orthopedics;  Laterality: Left;    Allergies  Allergen Reactions  . Codeine     "Increases heart rate"   . Morphine And Related     Hallucinations   . Nsaids     Hx  GI bleed  . Valium [Diazepam] Other (See Comments)    Patient states that this medication made her want to commit sucide    Current Outpatient Prescriptions on File Prior to Visit  Medication Sig Dispense Refill  . cetirizine (ZYRTEC) 10 MG tablet Take 10 mg by mouth daily.      . clonazePAM (KLONOPIN) 0.5 MG tablet Take 0.5 mg by mouth 2 (two) times daily.       Marland Kitchen dicyclomine (BENTYL)  10 MG capsule Take 1 capsule (10 mg total) by mouth 2 (two) times daily.  90 capsule  0  . diltiazem (CARDIZEM CD) 180 MG 24 hr capsule Take 180 mg by mouth daily.        . diphenoxylate-atropine (LOMOTIL) 2.5-0.025 MG per tablet Take 1 tablet by mouth as needed. Ulcer stomach      . ergocalciferol (VITAMIN D2) 50000 UNITS capsule Take 50,000 Units by mouth once a week.        . gabapentin (NEURONTIN) 100 MG capsule Take 100 mg by mouth at bedtime.      Marland Kitchen glyBURIDE-metformin (GLUCOVANCE) 2.5-500 MG per tablet Take 1 tablet by mouth 2 (two) times daily.       Marland Kitchen levothyroxine (SYNTHROID, LEVOTHROID) 50 MCG tablet Take 50 mcg by mouth daily.      Marland Kitchen lisinopril-hydrochlorothiazide (PRINZIDE,ZESTORETIC) 10-12.5 MG per tablet Take 1 tablet by mouth daily.        Marland Kitchen loratadine (CLARITIN) 10 MG tablet Take 10 mg by mouth daily.      . pantoprazole (PROTONIX) 40 MG  tablet Take 40 mg by mouth 2 (two) times daily before a meal.      . pantoprazole (PROTONIX) 40 MG tablet daily      . propranolol (INDERAL LA) 120 MG 24 hr capsule Take 120 mg by mouth daily.       . simvastatin (ZOCOR) 40 MG tablet Take 40 mg by mouth every evening.      . traMADol (ULTRAM) 50 MG tablet Take 50 mg by mouth every 6 (six) hours as needed. For pain      . traMADol (ULTRAM) 50 MG tablet Take 1 tablet (50 mg total) by mouth every 6 (six) hours as needed for pain.  30 tablet  0   No current facility-administered medications on file prior to visit.        Objective:   Physical Exam  Filed Vitals:   10/23/13 1429  BP: 154/62  Pulse: 60  Temp: 97.4 F (36.3 C)  Height: 5\' 2"  (1.575 m)  Weight: 171 lb 1.6 oz (77.61 kg)  Alert and oriented. Skin warm and dry. Oral mucosa is moist.   . Sclera anicteric, conjunctivae is pink. Thyroid not enlarged. No cervical lymphadenopathy. Lungs clear. Heart regular rate and rhythm.  Abdomen is soft. Bowel sounds are positive. No hepatomegaly. No abdominal masses felt. Diffuse tenderness without guarding. Stool brown and guaiac negative. No edema to lower extremities.        Assessment:     Diarrhea/IBS. Shigella. Continues to have some loose stools.  I discussed this case with Dr. Laural Golden.     Plan:     ProBiotic daily. Continue Dicyclomine BID. Cipro 500mg  BID x 5 days. OV in July. PR in 2 weeks.

## 2013-10-23 NOTE — Patient Instructions (Signed)
Cipro 500mg  BID x 5 days. Probiotic daily. OV 2 months. PR in 2 weeks. Dicyclomine BID

## 2013-11-19 ENCOUNTER — Ambulatory Visit (INDEPENDENT_AMBULATORY_CARE_PROVIDER_SITE_OTHER): Payer: Medicare Other | Admitting: Internal Medicine

## 2013-11-19 ENCOUNTER — Encounter (INDEPENDENT_AMBULATORY_CARE_PROVIDER_SITE_OTHER): Payer: Self-pay | Admitting: Internal Medicine

## 2013-11-19 VITALS — BP 104/50 | HR 72 | Temp 97.6°F | Ht 62.0 in | Wt 171.6 lb

## 2013-11-19 DIAGNOSIS — R197 Diarrhea, unspecified: Secondary | ICD-10-CM

## 2013-11-19 NOTE — Patient Instructions (Signed)
Imodium if more than 3 stools a day. OV in 1 yr with Dr. Laural Golden

## 2013-11-19 NOTE — Progress Notes (Signed)
Subjective:     Patient ID: [name], female   DOB: Oct 07, 1945, 68 y.o.   MRN: [id number]  HPI  Here today for f/u. Last seen 10/23/2013.  Seen in May with diarrhea for 2 weeks. Having 6-8 stools a day. She tested positive for Shigella. She was covered with Cipro 500mg  BID x 3 days. She tells me she is better. She says her nerves is causing her diarrhea. She says she just cannot handle things like she use to. She is having two stools a day. Her stools are much better. 1st this am was formed, second stool was loose. No melena or bright red rectal bleeding. Appetite is good. No weight loss. No abdominal pain. She says she feels 50% better. She has a hx of chronic diarrhea.   Colonoscopy 2010 for chronic diarrhea:  Normal terminal ileum. No endoscopic evidence of colitis. Random biopsies taken from ascending and sigmoid colon. A few scattered diverticula at transverse colon and descending colon. Small rectal polyp.   10/23/2013  OV 08/07/2013  04/01/2011 EGD/ED: solid food dysphagia.  Impression:  Normal esophagogastroduodenoscopy.  Esophagus dilated by passing 54 and 56 French Maloney dilators but no mucosal disruption induced.  Suspect she may have esophageal motility disorder.  Further evaluation would be considered if her dysphagia persists.     Review of Systems Past Medical History  Diagnosis Date  . Essential hypertension, benign   . Type 2 diabetes mellitus   . Diabetic neuropathy   . GI bleed     NSAID use  . TIA (transient ischemic attack)     1998  . Diverticulosis   . Urinary incontinence   . Degenerative joint disease   . Benign essential tremor   . Diabetes mellitus   . Chronic diarrhea   . Seasonal allergies   . GERD (gastroesophageal reflux disease)   . Cardiac murmur     Reportedly for years-mild  . Shortness of breath   . Anxiety   . PONV (postoperative nausea and vomiting)     Past Surgical History  Procedure Laterality Date  . Replacement total knee  bilateral  2001, 2005  . Cholecystectomy    . Arthroscopic left shoulder surgery    . Appendectomy    . Colostomy with reversal    . Right elbow surgery    . Esophagogastroduodenoscopy      EGD 06/08/2008  . Colonoscopy  03/03/09  . Colonoscopy  09/16/2004    ROURK  . Esophagogastroduodenoscopy  09/16/04    DIAG EGD W/TCS  . Abdominal hysterectomy    . Tonsillectomy    . Colon surgery    . Breast surgery      lumpectomy X 2-benign  . Tendon reconstruction Left 10/03/2012    Procedure: LEFT MEDIAL ELBOW RECONSTRUCTION;  Surgeon: Cammie Sickle., MD;  Location: [facility];  Service: Orthopedics;  Laterality: Left;  . Carpal tunnel release Left 10/03/2012    Procedure: CARPAL TUNNEL RELEASE;  Surgeon: Cammie Sickle., MD;  Location: [facility];  Service: Orthopedics;  Laterality: Left;    Allergies  Allergen Reactions  . Codeine     "Increases heart rate"   . Morphine And Related     Hallucinations   . Nsaids     Hx  GI bleed  . Valium [Diazepam] Other (See Comments)    Patient states that this medication made her want to commit sucide    Current Outpatient Prescriptions on File Prior to  Visit  Medication Sig Dispense Refill  . cetirizine (ZYRTEC) 10 MG tablet Take 10 mg by mouth daily.      . ciprofloxacin (CIPRO) 500 MG tablet Take 1 tablet (500 mg total) by mouth 2 (two) times daily.  10 tablet  0  . clonazePAM (KLONOPIN) 0.5 MG tablet Take 0.5 mg by mouth 2 (two) times daily.       Marland Kitchen dicyclomine (BENTYL) 10 MG capsule Take 1 capsule (10 mg total) by mouth 2 (two) times daily.  90 capsule  0  . diltiazem (CARDIZEM CD) 180 MG 24 hr capsule Take 180 mg by mouth daily.        . diphenoxylate-atropine (LOMOTIL) 2.5-0.025 MG per tablet Take 1 tablet by mouth as needed. Ulcer stomach      . ergocalciferol (VITAMIN D2) 50000 UNITS capsule Take 50,000 Units by mouth once a week.        . gabapentin (NEURONTIN) 100 MG capsule Take 100 mg by  mouth at bedtime.      Marland Kitchen glyBURIDE-metformin (GLUCOVANCE) 2.5-500 MG per tablet Take 1 tablet by mouth 2 (two) times daily.       Marland Kitchen levothyroxine (SYNTHROID, LEVOTHROID) 50 MCG tablet Take 50 mcg by mouth daily.      Marland Kitchen lisinopril-hydrochlorothiazide (PRINZIDE,ZESTORETIC) 10-12.5 MG per tablet Take 1 tablet by mouth daily.        Marland Kitchen loratadine (CLARITIN) 10 MG tablet Take 10 mg by mouth daily.      . pantoprazole (PROTONIX) 40 MG tablet Take 40 mg by mouth 2 (two) times daily before a meal.      . pantoprazole (PROTONIX) 40 MG tablet daily      . propranolol (INDERAL LA) 120 MG 24 hr capsule Take 120 mg by mouth daily.       . simvastatin (ZOCOR) 40 MG tablet Take 40 mg by mouth every evening.      . traMADol (ULTRAM) 50 MG tablet Take 50 mg by mouth every 6 (six) hours as needed. For pain      . traMADol (ULTRAM) 50 MG tablet Take 1 tablet (50 mg total) by mouth every 6 (six) hours as needed for pain.  30 tablet  0   No current facility-administered medications on file prior to visit.        Objective:   Physical Exam  Filed Vitals:   11/19/13 1436  BP: 104/50  Pulse: 72  Temp: 97.6 F (36.4 C)  Height: 5\' 2"  (1.575 m)  Weight: 171 lb 9.6 oz (77.837 kg)   Alert and oriented. Skin warm and dry. Oral mucosa is moist.   . Sclera anicteric, conjunctivae is pink. Thyroid not enlarged. No cervical lymphadenopathy. Lungs clear. Heart regular rate and rhythm.  Abdomen is soft. Bowel sounds are positive. No hepatomegaly. No abdominal masses felt. No tenderness.  No edema to lower extremities.       Assessment:    Diarrhea which is better now. Stools studies last month revealed Shigella. Treated with Cipro BID x 3 days.      Plan:    OV in 1 yr with Dr. Laural Golden. If more than 3 stools a day try Imodium. Continue the Probiotic. May reduce Dicyclomine to once a day.

## 2013-11-26 ENCOUNTER — Other Ambulatory Visit (INDEPENDENT_AMBULATORY_CARE_PROVIDER_SITE_OTHER): Payer: Self-pay | Admitting: Internal Medicine

## 2014-01-15 ENCOUNTER — Other Ambulatory Visit (INDEPENDENT_AMBULATORY_CARE_PROVIDER_SITE_OTHER): Payer: Self-pay | Admitting: Internal Medicine

## 2014-01-15 DIAGNOSIS — K219 Gastro-esophageal reflux disease without esophagitis: Secondary | ICD-10-CM

## 2014-01-17 NOTE — Telephone Encounter (Signed)
Takes Protonix BID

## 2014-02-05 ENCOUNTER — Other Ambulatory Visit (INDEPENDENT_AMBULATORY_CARE_PROVIDER_SITE_OTHER): Payer: Self-pay | Admitting: Internal Medicine

## 2014-02-12 ENCOUNTER — Other Ambulatory Visit (INDEPENDENT_AMBULATORY_CARE_PROVIDER_SITE_OTHER): Payer: Self-pay | Admitting: *Deleted

## 2014-02-12 DIAGNOSIS — K219 Gastro-esophageal reflux disease without esophagitis: Secondary | ICD-10-CM

## 2014-02-12 MED ORDER — PANTOPRAZOLE SODIUM 40 MG PO TBEC: 40.0000 mg | DELAYED_RELEASE_TABLET | Freq: Two times a day (BID) | ORAL | Status: DC

## 2014-02-12 NOTE — Telephone Encounter (Signed)
This was resent ,as [name] had done 02/06/14 and there was an error.

## 2014-02-13 MED ORDER — PANTOPRAZOLE SODIUM 40 MG PO TBEC: 40.0000 mg | DELAYED_RELEASE_TABLET | Freq: Every day | ORAL | Status: DC

## 2014-02-13 NOTE — Telephone Encounter (Signed)
I have talked with patient. Protonix once a day. May try Zantac at night OTC. Her insurance will not cover twice a day dosing with the Protonix.

## 2014-02-22 ENCOUNTER — Telehealth (INDEPENDENT_AMBULATORY_CARE_PROVIDER_SITE_OTHER): Payer: Self-pay | Admitting: *Deleted

## 2014-02-22 NOTE — Telephone Encounter (Signed)
Pa for Pantoprazole was completed. Per Vinnie Level , Nurse with [employer] , this is approved for 1 year. The cap has been lifted. She will contact the patient and let her know. Pharmacy made aware.

## 2014-04-09 ENCOUNTER — Telehealth (INDEPENDENT_AMBULATORY_CARE_PROVIDER_SITE_OTHER): Payer: Self-pay | Admitting: Internal Medicine

## 2014-04-09 MED ORDER — DICYCLOMINE HCL 10 MG PO CAPS
10.0000 mg | ORAL_CAPSULE | Freq: Two times a day (BID) | ORAL | Status: DC
Start: 2014-04-09 — End: 2014-11-19

## 2014-04-09 NOTE — Telephone Encounter (Signed)
Rx for dicyclomine

## 2014-05-02 ENCOUNTER — Telehealth (INDEPENDENT_AMBULATORY_CARE_PROVIDER_SITE_OTHER): Payer: Self-pay | Admitting: *Deleted

## 2014-05-02 NOTE — Telephone Encounter (Signed)
Patient's Insurance will not cover the 10 mg of Dicyclomine. However the 20 mg is on the formulary. Per Lelon Perla May call in Dicyclomine 20 mg take 1 by mouth daily #30 11 refills. This was called to [facility]. Patient was called and made aware.

## 2014-05-15 ENCOUNTER — Encounter (INDEPENDENT_AMBULATORY_CARE_PROVIDER_SITE_OTHER): Payer: Self-pay | Admitting: *Deleted

## 2014-05-22 ENCOUNTER — Telehealth (INDEPENDENT_AMBULATORY_CARE_PROVIDER_SITE_OTHER): Payer: Self-pay | Admitting: *Deleted

## 2014-05-22 NOTE — Telephone Encounter (Signed)
Try taking some Maalox or Gaviscon

## 2014-05-22 NOTE — Telephone Encounter (Signed)
[name] is having smelly burps leaving along a bad taste behind. Would like to know if there is anything that could help her. The return phone number is [phone].

## 2014-11-19 ENCOUNTER — Encounter (INDEPENDENT_AMBULATORY_CARE_PROVIDER_SITE_OTHER): Payer: Self-pay | Admitting: *Deleted

## 2014-11-19 ENCOUNTER — Encounter (INDEPENDENT_AMBULATORY_CARE_PROVIDER_SITE_OTHER): Payer: Self-pay | Admitting: Internal Medicine

## 2014-11-19 ENCOUNTER — Ambulatory Visit (INDEPENDENT_AMBULATORY_CARE_PROVIDER_SITE_OTHER): Payer: Medicare Other | Admitting: Internal Medicine

## 2014-11-19 VITALS — BP 108/64 | HR 70 | Temp 97.9°F | Resp 18 | Ht 62.0 in | Wt 177.1 lb

## 2014-11-19 DIAGNOSIS — K589 Irritable bowel syndrome without diarrhea: Secondary | ICD-10-CM | POA: Diagnosis not present

## 2014-11-19 DIAGNOSIS — K219 Gastro-esophageal reflux disease without esophagitis: Secondary | ICD-10-CM | POA: Diagnosis not present

## 2014-11-19 DIAGNOSIS — R112 Nausea with vomiting, unspecified: Secondary | ICD-10-CM

## 2014-11-19 NOTE — Patient Instructions (Signed)
Physician will call with results of gastric emptying study when completed

## 2014-11-19 NOTE — Progress Notes (Signed)
Presenting complaint;  Follow-up for GERD and chronic diarrhea/IBS.  Subjective:  Patient is 69 year old Caucasian female who is here for scheduled visit. She was last seen one year ago. She has multiple complaints. She complains of frequent burping. She states most of her burps are foul-smelling. This symptom started about 6-8 months ago. She also has intermittent nausea and vomiting which occurs at night. She says she goes to sleep with pan at bedside. She has at least one episode of week. He also complains of dysphagia. She says her daughter performed Hemleich maneuver twice with success. She is trying to eat slowly and chew her food good. She does not have heartburn often. She says she has an average of 2-3 bowel movements per day. She never misses a day and been constipated and 68 is. Most of her stools are mushy orders sent off forms. She denies melena or rectal bleeding. She has nocturnal bowel movement usually 2-3 times a month. She denies melena or rectal bleeding. Her appetite is good. Exercises using stationary bike 3 times a week. She does no more than 10-15 minutes each time. Patient states she does not take Lomotil daily. She says she's been diabetic for about 16 years. Recent hemoglobin A 1C was 6.4.   Last EGD was in January 2010 when she presented with upper GI bleed. She was taking too many leads. She had gastric ulcer at antrum. She also had multiple gastric erosions.  Last colonoscopy was in in October 2010 revealing normal terminal ileum normal appearing mucosa. She had few diverticula at transverse and descending colon. Biopsy from sigmoid colon was negative for microscopic colitis and small rectal polyp was presumably hypoplastic.    Current Medications: Outpatient Encounter Prescriptions as of 11/19/2014  Medication Sig  . cetirizine (ZYRTEC) 10 MG tablet Take 10 mg by mouth daily.  . clonazePAM (KLONOPIN) 0.5 MG tablet Take 0.5 mg by mouth 2 (two) times daily.   Marland Kitchen  dicyclomine (BENTYL) 20 MG tablet Take 20 mg by mouth daily before breakfast.  . diltiazem (CARDIZEM CD) 180 MG 24 hr capsule Take 180 mg by mouth daily.    . diphenoxylate-atropine (LOMOTIL) 2.5-0.025 MG per tablet Take 1 tablet by mouth as needed. Ulcer stomach  . ergocalciferol (VITAMIN D2) 50000 UNITS capsule Take 50,000 Units by mouth once a week.    . gabapentin (NEURONTIN) 100 MG capsule Take 100 mg by mouth at bedtime.  Marland Kitchen glipiZIDE (GLUCOTROL XL) 5 MG 24 hr tablet Take 5 mg by mouth daily.  Marland Kitchen levothyroxine (SYNTHROID, LEVOTHROID) 50 MCG tablet Take 50 mcg by mouth daily.  Marland Kitchen lisinopril-hydrochlorothiazide (PRINZIDE,ZESTORETIC) 10-12.5 MG per tablet Take 1 tablet by mouth daily.    Marland Kitchen loratadine (CLARITIN) 10 MG tablet Take 10 mg by mouth daily.  . metFORMIN (GLUCOPHAGE) 500 MG tablet Take 500 mg by mouth 2 (two) times daily.  . pantoprazole (PROTONIX) 40 MG tablet daily  . pantoprazole (PROTONIX) 40 MG tablet Take 1 tablet (40 mg total) by mouth daily.  . Probiotic Product ([place]) Take by mouth daily.  . propranolol (INDERAL LA) 120 MG 24 hr capsule Take 120 mg by mouth daily.   . ranitidine (ZANTAC) 150 MG tablet Take 150 mg by mouth at bedtime.  . simvastatin (ZOCOR) 40 MG tablet Take 40 mg by mouth every evening.  . traMADol (ULTRAM) 50 MG tablet Take 50 mg by mouth every 6 (six) hours as needed. For pain  . [DISCONTINUED] dicyclomine (BENTYL) 10 MG capsule Take 1  capsule (10 mg total) by mouth 2 (two) times daily.  . [DISCONTINUED] ciprofloxacin (CIPRO) 500 MG tablet Take 1 tablet (500 mg total) by mouth 2 (two) times daily. (Patient not taking: Reported on 11/19/2014)  . [DISCONTINUED] glyBURIDE-metformin (GLUCOVANCE) 2.5-500 MG per tablet Take 1 tablet by mouth 2 (two) times daily.   . [DISCONTINUED] Probiotic Product (TRUBIOTICS PO) Take by mouth every morning.  . [DISCONTINUED] traMADol (ULTRAM) 50 MG tablet Take 1 tablet (50 mg total) by mouth every 6 (six) hours  as needed for pain. (Patient not taking: Reported on 11/19/2014)   No facility-administered encounter medications on file as of 11/19/2014.     Objective: Blood pressure 108/64, pulse 70, temperature 97.9 F (36.6 C), temperature source Oral, resp. rate 18, height 5\' 2"  (1.575 m), weight 177 lb 1.6 oz (80.332 kg). Patient is alert and in no acute distress. She has tremors to both hands. Conjunctiva is pink. Sclera is nonicteric Oropharyngeal mucosa is normal. No neck masses or thyromegaly noted. Cardiac exam with regular rhythm normal S1 and S2. No murmur or gallop noted. Lungs are clear to auscultation. Abdomen is full. Bowel sounds are normal. She has small supra umbilicus hernia which is completely reducible. Abdomen is soft with mild tenderness across lower half. No organomegaly or masses. No LE edema or clubbing noted.   Assessment:  #1. Nausea and vomiting. Given history of foul-smelling burps and given the fact that she is diabetic, gastroparesis needs to be ruled out. Some of her medications may also be impairing gastric motility. #2. GERD. Typical symptoms are well controlled with PPI. #3. Chronic diarrhea secondary to IBS. She is doing fairly well with combination of anti-spasmodic and antidiarrheal.   Plan:  Solid phase gastric emptying study She will continue pantoprazole, dicyclomine and Lomotil as before. Office visit in one year unless dysphagia recurs.

## 2014-11-27 ENCOUNTER — Encounter (HOSPITAL_COMMUNITY): Payer: Self-pay

## 2014-11-27 ENCOUNTER — Ambulatory Visit (HOSPITAL_COMMUNITY)
Admission: RE | Admit: 2014-11-27 | Discharge: 2014-11-27 | Disposition: A | Discharge status: Home / Self Care | Payer: Medicare Other | Source: Ambulatory Visit | Attending: Internal Medicine | Admitting: Internal Medicine

## 2014-11-27 DIAGNOSIS — R112 Nausea with vomiting, unspecified: Secondary | ICD-10-CM | POA: Diagnosis not present

## 2014-11-27 MED ORDER — SODIUM CHLORIDE 0.9 % IJ SOLN
INTRAMUSCULAR | Status: AC
  Filled 2014-11-27: qty 42

## 2014-11-27 MED ORDER — TECHNETIUM TC 99M SULFUR COLLOID
2.0000 | Freq: Once | INTRAVENOUS | Status: AC | PRN
Start: 2014-11-27 — End: 2014-11-27
  Administered 2014-11-27: 2 via ORAL

## 2015-01-11 ENCOUNTER — Other Ambulatory Visit (INDEPENDENT_AMBULATORY_CARE_PROVIDER_SITE_OTHER): Payer: Self-pay | Admitting: Internal Medicine

## 2015-01-16 ENCOUNTER — Other Ambulatory Visit (INDEPENDENT_AMBULATORY_CARE_PROVIDER_SITE_OTHER): Payer: Self-pay | Admitting: Internal Medicine

## 2015-01-16 MED ORDER — DICYCLOMINE HCL 20 MG PO TABS: 20.0000 mg | ORAL_TABLET | Freq: Every day | ORAL | Status: DC

## 2015-07-24 ENCOUNTER — Telehealth (INDEPENDENT_AMBULATORY_CARE_PROVIDER_SITE_OTHER): Payer: Self-pay | Admitting: Internal Medicine

## 2015-07-24 DIAGNOSIS — R197 Diarrhea, unspecified: Secondary | ICD-10-CM

## 2015-07-24 MED ORDER — DIPHENOXYLATE-ATROPINE 2.5-0.025 MG PO TABS: 1.0000 | ORAL_TABLET | ORAL | Status: DC | PRN

## 2015-07-24 NOTE — Telephone Encounter (Signed)
Rx faxed to her pharmacy

## 2015-07-24 NOTE — Telephone Encounter (Signed)
[name] left a message saying she's had diarrhea since Monday and she's unsure why. She said it's so bad she's having to wear Depends. She also needs a cream to rub on her buttocks because it's raw and painful to the touch. She wants Dr. Laural Golden to send a Rx of Lomotil to her pharmacy. Please call her regarding this if possible.   Pt's ph# [phone]  Thank you.

## 2015-07-24 NOTE — Telephone Encounter (Signed)
Try Desitin ointment to rectal area.

## 2015-07-24 NOTE — Telephone Encounter (Signed)
No answer on cell phone.

## 2015-07-24 NOTE — Telephone Encounter (Signed)
I have sent an Rx for Lomotil to [facility]

## 2015-07-24 NOTE — Telephone Encounter (Signed)
[name]- will you address this for patient? Review the telephone encounter, patient is requesting Lomotil and something a cream or something to rub on her bottom.  Thank you

## 2015-08-19 ENCOUNTER — Encounter (INDEPENDENT_AMBULATORY_CARE_PROVIDER_SITE_OTHER): Payer: Self-pay | Admitting: Internal Medicine

## 2015-11-19 ENCOUNTER — Ambulatory Visit (INDEPENDENT_AMBULATORY_CARE_PROVIDER_SITE_OTHER): Payer: Medicare Other | Admitting: Internal Medicine

## 2015-11-19 ENCOUNTER — Encounter (INDEPENDENT_AMBULATORY_CARE_PROVIDER_SITE_OTHER): Payer: Self-pay | Admitting: Internal Medicine

## 2015-11-19 VITALS — BP 132/70 | HR 60 | Temp 97.5°F | Ht 67.0 in | Wt 171.5 lb

## 2015-11-19 DIAGNOSIS — K219 Gastro-esophageal reflux disease without esophagitis: Secondary | ICD-10-CM

## 2015-11-19 DIAGNOSIS — R197 Diarrhea, unspecified: Secondary | ICD-10-CM

## 2015-11-19 NOTE — Progress Notes (Signed)
Subjective:    Patient ID: [name], female    DOB: [birth year], 70 y.o.   MRN: [id number]  HPI Here today for f/u. She was last seen in June of 2016 by Dr. Laural Golden. She does not take Lomotil daily. Recently had surgery for removal of left ovarian mass which was benign by Dr. Derenda Mis. 11/2014 Gastric emptying study was negative. Hx of diabetes. She occasionally has acid reflux and then she will go 3-4 weeks without having. Her appetite is good.  She does have some tenderness to her lower abdomen after undergoing surgery for a left ovarian mass. She has a BM at least twice a day. No melena or BRRB. Usually her stools are loose. She does have some formed stools.  She may skip a day having a BM  No dysphagia. She is trying to exercise by walking.  Rarely has nausea. She has frequent burping. Diabetic since 2010. HA1C 5.2 in May.            Last EGD was in January 2010 when she presented with upper GI bleed. She was taking too many leads. She had gastric ulcer at antrum. She also had multiple gastric erosions.  Last colonoscopy was in in October 2010 revealing normal terminal ileum normal appearing mucosa. She had few diverticula at transverse and descending colon. Biopsy from sigmoid colon was negative for microscopic colitis and small rectal polyp was presumably hypoplastic.    Review of Systems Past Medical History  Diagnosis Date  . Essential hypertension, benign   . Type 2 diabetes mellitus ([place])   . Diabetic neuropathy ([place])   . GI bleed     NSAID use  . TIA (transient ischemic attack)     1998  . Diverticulosis   . Urinary incontinence   . Degenerative joint disease   . Benign essential tremor   . Diabetes mellitus   . Chronic diarrhea   . Seasonal allergies   . GERD (gastroesophageal reflux disease)   . Cardiac murmur     Reportedly for years-mild  . Shortness of breath   . Anxiety   . PONV (postoperative nausea and vomiting)     Past Surgical History    Procedure Laterality Date  . Replacement total knee bilateral  2001, 2005  . Cholecystectomy    . Arthroscopic left shoulder surgery    . Appendectomy    . Colostomy with reversal    . Right elbow surgery    . Esophagogastroduodenoscopy      EGD 06/08/2008  . Colonoscopy  03/03/09  . Colonoscopy  09/16/2004    ROURK  . Esophagogastroduodenoscopy  09/16/04    DIAG EGD W/TCS  . Abdominal hysterectomy    . Tonsillectomy    . Colon surgery    . Breast surgery      lumpectomy X 2-benign  . Tendon reconstruction Left 10/03/2012    Procedure: LEFT MEDIAL ELBOW RECONSTRUCTION;  Surgeon: Cammie Sickle., MD;  Location: [facility];  Service: Orthopedics;  Laterality: Left;  . Carpal tunnel release Left 10/03/2012    Procedure: CARPAL TUNNEL RELEASE;  Surgeon: Cammie Sickle., MD;  Location: [facility];  Service: Orthopedics;  Laterality: Left;    Allergies  Allergen Reactions  . Codeine     "Increases heart rate"   . Morphine And Related     Hallucinations   . Nsaids     Hx  GI bleed  . Valium [Diazepam] Other (See Comments)  Patient states that this medication made her want to commit sucide    Current Outpatient Prescriptions on File Prior to Visit  Medication Sig Dispense Refill  . cetirizine (ZYRTEC) 10 MG tablet Take 10 mg by mouth daily.    . clonazePAM (KLONOPIN) 0.5 MG tablet Take 0.5 mg by mouth 2 (two) times daily.     Marland Kitchen dicyclomine (BENTYL) 20 MG tablet Take 1 tablet (20 mg total) by mouth daily before breakfast. 90 tablet 4  . diltiazem (CARDIZEM CD) 180 MG 24 hr capsule Take 180 mg by mouth daily.      . diphenoxylate-atropine (LOMOTIL) 2.5-0.025 MG tablet Take 1 tablet by mouth as needed. Ulcer stomach 30 tablet 0  . ergocalciferol (VITAMIN D2) 50000 UNITS capsule Take by mouth once a week.     . gabapentin (NEURONTIN) 100 MG capsule Take 100 mg by mouth at bedtime.    Marland Kitchen glipiZIDE (GLUCOTROL XL) 5 MG 24 hr tablet Take 5 mg by  mouth daily.  4  . levothyroxine (SYNTHROID, LEVOTHROID) 50 MCG tablet Take 50 mcg by mouth daily.    Marland Kitchen loratadine (CLARITIN) 10 MG tablet Take 10 mg by mouth daily.    . metFORMIN (GLUCOPHAGE) 500 MG tablet Take 500 mg by mouth 2 (two) times daily.  4  . pantoprazole (PROTONIX) 40 MG tablet daily    . pantoprazole (PROTONIX) 40 MG tablet TAKE 1 TABLET BY MOUTH IN THE MORNING 30 tablet 11  . Probiotic Product ([place]) Take by mouth daily.    . propranolol (INDERAL LA) 120 MG 24 hr capsule Take 120 mg by mouth daily.     . ranitidine (ZANTAC) 150 MG tablet Take 150 mg by mouth at bedtime.  12  . simvastatin (ZOCOR) 40 MG tablet Take 40 mg by mouth every evening.    . traMADol (ULTRAM) 50 MG tablet Take 50 mg by mouth every 6 (six) hours as needed. For pain    . lisinopril-hydrochlorothiazide (PRINZIDE,ZESTORETIC) 10-12.5 MG per tablet Take 1 tablet by mouth daily.       No current facility-administered medications on file prior to visit.        Objective:   Physical ExamBlood pressure 132/70, pulse 60, temperature 97.5 F (36.4 C), height 5\' 7"  (1.702 m), weight 171 lb 8 oz (77.792 kg). Alert and oriented. Skin warm and dry. Oral mucosa is moist.   . Sclera anicteric, conjunctivae is pink. Thyroid not enlarged. No cervical lymphadenopathy. Lungs clear. Heart regular rate and rhythm.  Abdomen is soft. Bowel sounds are positive. No hepatomegaly. No abdominal masses felt. Tenderness lower abdomen from recent surgery.  No edema to lower extremities.          Assessment & Plan:    GERD for most part controlled.  Chronic diarrhea secondary to IBS. Takes Dicyclomine once a day. Lomotil as needed. OV in 1 year.

## 2015-11-19 NOTE — Patient Instructions (Signed)
Continue fiber. Lomotil as needed. Continue the Protonix.  OV in 1 year.

## 2015-12-10 ENCOUNTER — Other Ambulatory Visit (INDEPENDENT_AMBULATORY_CARE_PROVIDER_SITE_OTHER): Payer: Self-pay | Admitting: Internal Medicine

## 2016-02-21 ENCOUNTER — Other Ambulatory Visit (INDEPENDENT_AMBULATORY_CARE_PROVIDER_SITE_OTHER): Payer: Self-pay | Admitting: Internal Medicine

## 2016-06-28 ENCOUNTER — Other Ambulatory Visit (INDEPENDENT_AMBULATORY_CARE_PROVIDER_SITE_OTHER): Payer: Self-pay | Admitting: Internal Medicine

## 2016-07-11 IMAGING — NM NM GASTRIC EMPTYING
10 series · 10 of 10 positions shown · non-contrast
Comparison: None.

CLINICAL DATA: Nausea and vomiting.

EXAM:
NUCLEAR MEDICINE GASTRIC EMPTYING SCAN
TECHNIQUE: After oral ingestion of radiolabeled meal, sequential abdominal
images were obtained for 4 hours. Percentage of activity emptying
the stomach was calculated at 1 hour, 2 hour, 3 hour, and 4 hours.
RADIOPHARMACEUTICALS:  2.0 mCi mCi Zc-44m MDP labeled sulfur colloid
orally

[Series 1: 0 min · 4.14mm/px · 1 of 1 slices shown (1 of 2)]
[im 1/1]
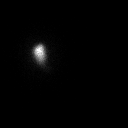

[Series 1: 0 min · 4.14mm/px · 1 of 1 slices shown (2 of 2)]
[im 1/1]
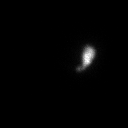

[Series 2: 60 min · 4.14mm/px · 1 of 1 slices shown (1 of 2)]
[im 1/1]
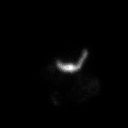

[Series 2: 60 min · 4.14mm/px · 1 of 1 slices shown (2 of 2)]
[im 1/1]
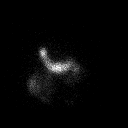

[Series 3: 120 min · 4.14mm/px · 1 of 1 slices shown (1 of 2)]
[im 1/1  full-range]
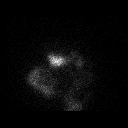

[Series 3: 120 min · 4.14mm/px · 1 of 1 slices shown (2 of 2)]
[im 1/1]
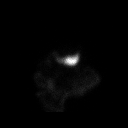

[Series 4: 180 min · 4.14mm/px · 1 of 1 slices shown (1 of 2)]
[im 1/1]
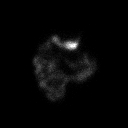

[Series 4: 180 min · 4.14mm/px · 1 of 1 slices shown (2 of 2)]
[im 1/1  full-range]
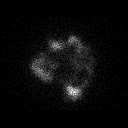

[Series 5: 240 min · 4.14mm/px · 1 of 1 slices shown (1 of 2)]
[im 1/1  full-range]
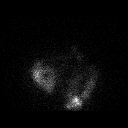

[Series 5: 240 min · 4.14mm/px · 1 of 1 slices shown (2 of 2)]
[im 1/1]
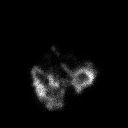

[10 of 10 positions shown; findings below may reference images not displayed]

FINDINGS: Expected location of the stomach in the left upper quadrant.
Ingested meal empties the stomach gradually over the course of the
study.

37% emptied at 1 hr ( normal >= 10%)

62.3% emptied at 2 hr ( normal >= 40%)

76.2% emptied at 3 hr ( normal >= 70%)

99.2 % emptied at 4 hr ( normal >= 90%)
IMPRESSION: Normal gastric emptying study.

## 2016-11-02 ENCOUNTER — Encounter (INDEPENDENT_AMBULATORY_CARE_PROVIDER_SITE_OTHER): Payer: Self-pay

## 2016-11-02 ENCOUNTER — Encounter (INDEPENDENT_AMBULATORY_CARE_PROVIDER_SITE_OTHER): Payer: Self-pay | Admitting: Internal Medicine

## 2016-11-18 ENCOUNTER — Encounter (INDEPENDENT_AMBULATORY_CARE_PROVIDER_SITE_OTHER): Payer: Self-pay | Admitting: Internal Medicine

## 2016-11-18 ENCOUNTER — Telehealth (INDEPENDENT_AMBULATORY_CARE_PROVIDER_SITE_OTHER): Payer: Self-pay | Admitting: Internal Medicine

## 2016-11-18 ENCOUNTER — Ambulatory Visit (INDEPENDENT_AMBULATORY_CARE_PROVIDER_SITE_OTHER): Payer: Medicare Other | Admitting: Internal Medicine

## 2016-11-18 VITALS — BP 150/70 | HR 60 | Temp 98.0°F | Ht 62.0 in | Wt 176.4 lb

## 2016-11-18 DIAGNOSIS — K219 Gastro-esophageal reflux disease without esophagitis: Secondary | ICD-10-CM | POA: Diagnosis not present

## 2016-11-18 NOTE — Telephone Encounter (Signed)
Hope, please sent copy of chart to Dr. Woody Seller.

## 2016-11-18 NOTE — Patient Instructions (Addendum)
OV in 1 year.  She will be due for a colonoscopy in 2020.

## 2016-11-18 NOTE — Progress Notes (Addendum)
Subjective:    Patient ID: [name], female    DOB: 01-06-1946, 71 y.o.   MRN: [id number]  HPIHere today for. F/o. Last seen in June of 2017. She tells me for the most part she is doing good with her acid reflux. She does c/o some diarrhea .She has diarrhea 2-3 times a month. Her appetite is good. No weight loss. She has a BM daily.  She does walk and has gone to the gym for exercise. Normal Gastric emptying study in 2016.   Last EGD was in January 2010 when she presented with upper GI bleed. She was taking too many leads. She had gastric ulcer at antrum. She also had multiple gastric erosions.  Last colonoscopy was in in October 2010 revealing normal terminal ileum normal appearing mucosa. She had few diverticula at transverse and descending colon. Biopsy from sigmoid colon was negative for microscopic colitis and small rectal polyp was presumably hypoplastic. Review of Systems  Past Medical History:  Diagnosis Date  . Anxiety   . Benign essential tremor   . Cardiac murmur    Reportedly for years-mild  . Chronic diarrhea   . Degenerative joint disease   . Diabetes mellitus   . Diabetic neuropathy ([place])   . Diverticulosis   . Essential hypertension, benign   . GERD (gastroesophageal reflux disease)   . GI bleed    NSAID use  . PONV (postoperative nausea and vomiting)   . Seasonal allergies   . Shortness of breath   . TIA (transient ischemic attack)    1998  . Type 2 diabetes mellitus ([place])   . Urinary incontinence     Past Surgical History:  Procedure Laterality Date  . ABDOMINAL HYSTERECTOMY    . APPENDECTOMY    . Arthroscopic left shoulder surgery    . BREAST SURGERY     lumpectomy X 2-benign  . CARPAL TUNNEL RELEASE Left 10/03/2012   Procedure: CARPAL TUNNEL RELEASE;  Surgeon: Cammie Sickle., MD;  Location: [facility];  Service: Orthopedics;  Laterality: Left;  . CHOLECYSTECTOMY    . COLON SURGERY    . COLONOSCOPY  03/03/09  .  COLONOSCOPY  09/16/2004   ROURK  . Colostomy with reversal    . ESOPHAGOGASTRODUODENOSCOPY     EGD 06/08/2008  . ESOPHAGOGASTRODUODENOSCOPY  09/16/04   DIAG EGD W/TCS  . REPLACEMENT TOTAL KNEE BILATERAL  2001, 2005  . Right elbow surgery    . TENDON RECONSTRUCTION Left 10/03/2012   Procedure: LEFT MEDIAL ELBOW RECONSTRUCTION;  Surgeon: Cammie Sickle., MD;  Location: [facility];  Service: Orthopedics;  Laterality: Left;  . TONSILLECTOMY      Allergies  Allergen Reactions  . Codeine     "Increases heart rate"   . Morphine And Related     Hallucinations   . Nsaids     Hx  GI bleed  . Valium [Diazepam] Other (See Comments)    Patient states that this medication made her want to commit sucide    Current Outpatient Prescriptions on File Prior to Visit  Medication Sig Dispense Refill  . cetirizine (ZYRTEC) 10 MG tablet Take 10 mg by mouth daily.    . clonazePAM (KLONOPIN) 0.5 MG tablet Take 0.5 mg by mouth 2 (two) times daily.     Marland Kitchen dicyclomine (BENTYL) 20 MG tablet TAKE 1 TABLET EVERY DAY BEFORE BREAKFAST 90 tablet 2  . diltiazem (CARDIZEM CD) 180 MG 24 hr capsule Take 180 mg  by mouth daily.      . diphenoxylate-atropine (LOMOTIL) 2.5-0.025 MG tablet Take 1 tablet by mouth as needed. Ulcer stomach 30 tablet 0  . ergocalciferol (VITAMIN D2) 50000 UNITS capsule Take by mouth once a week.     . gabapentin (NEURONTIN) 100 MG capsule Take 100 mg by mouth at bedtime.    Marland Kitchen glipiZIDE (GLUCOTROL XL) 5 MG 24 hr tablet Take 5 mg by mouth daily.  4  . levothyroxine (SYNTHROID, LEVOTHROID) 50 MCG tablet Take 50 mcg by mouth daily.    Marland Kitchen lisinopril-hydrochlorothiazide (PRINZIDE,ZESTORETIC) 10-12.5 MG per tablet Take 1 tablet by mouth daily.      Marland Kitchen loratadine (CLARITIN) 10 MG tablet Take 10 mg by mouth daily.    . metFORMIN (GLUCOPHAGE) 500 MG tablet Take 500 mg by mouth 2 (two) times daily.  4  . pantoprazole (PROTONIX) 40 MG tablet TAKE 1 TABLET BY MOUTH IN THE MORNING 30  tablet 11  . propranolol (INDERAL LA) 120 MG 24 hr capsule Take 120 mg by mouth daily.     . traMADol (ULTRAM) 50 MG tablet Take 50 mg by mouth every 6 (six) hours as needed. For pain     No current facility-administered medications on file prior to visit.         Objective:   Physical Exam Blood pressure (!) 150/70, pulse 60, temperature 98 F (36.7 C), height 5\' 2"  (1.575 m), weight 176 lb 6.4 oz (80 kg). Alert and oriented. Skin warm and dry. Oral mucosa is moist.   . Sclera anicteric, conjunctivae is pink. Thyroid not enlarged. No cervical lymphadenopathy. Lungs clear. Heart regular rate and rhythm.  Abdomen is soft. Bowel sounds are positive. No hepatomegaly. No abdominal masses felt. No tenderness.  No edema to lower extremities.      Assessment & Plan:  GERD . Controlled with Protonix and Zantac. Continue present medications.  She will be due for a colonoscopy in 2020.

## 2016-11-22 NOTE — Telephone Encounter (Signed)
[name] has already forwarded notes to Dr. Woody Seller.

## 2016-11-26 ENCOUNTER — Other Ambulatory Visit (INDEPENDENT_AMBULATORY_CARE_PROVIDER_SITE_OTHER): Payer: Self-pay | Admitting: Internal Medicine

## 2017-01-25 ENCOUNTER — Telehealth (INDEPENDENT_AMBULATORY_CARE_PROVIDER_SITE_OTHER): Payer: Self-pay | Admitting: Internal Medicine

## 2017-01-25 DIAGNOSIS — R197 Diarrhea, unspecified: Secondary | ICD-10-CM

## 2017-01-25 MED ORDER — DIPHENOXYLATE-ATROPINE 2.5-0.025 MG PO TABS: 1.0000 | ORAL_TABLET | ORAL | 0 refills | Status: DC | PRN

## 2017-01-25 NOTE — Telephone Encounter (Signed)
Rx for Lomotil will be sent to her pharmacy

## 2017-01-25 NOTE — Telephone Encounter (Signed)
Patient called, lmoam that she wants to talk to [place].  She has had real bad diarrhea for three weeks and it's getting out of control.  [phone]

## 2017-01-25 NOTE — Telephone Encounter (Signed)
Rx for Lomotil sent to her pharmacy 

## 2017-02-14 ENCOUNTER — Telehealth (INDEPENDENT_AMBULATORY_CARE_PROVIDER_SITE_OTHER): Payer: Self-pay | Admitting: Internal Medicine

## 2017-02-14 DIAGNOSIS — R197 Diarrhea, unspecified: Secondary | ICD-10-CM

## 2017-02-14 NOTE — Telephone Encounter (Signed)
Patient called, stated that she still is having diarrhea, stomach hurts off and on and she's having to go to the bathroom 4-5 times a day.  She stated something has to be done.  [phone]

## 2017-02-15 ENCOUNTER — Telehealth (INDEPENDENT_AMBULATORY_CARE_PROVIDER_SITE_OTHER): Payer: Self-pay | Admitting: Internal Medicine

## 2017-02-15 DIAGNOSIS — R197 Diarrhea, unspecified: Secondary | ICD-10-CM

## 2017-02-15 MED ORDER — METRONIDAZOLE 500 MG PO TABS: 500.0000 mg | ORAL_TABLET | Freq: Two times a day (BID) | ORAL | 0 refills | Status: DC

## 2017-02-15 NOTE — Telephone Encounter (Signed)
Rx for Flagyl sent to her pharmacy 

## 2017-02-15 NOTE — Telephone Encounter (Signed)
Will get a GI pathogen.

## 2017-02-22 ENCOUNTER — Telehealth (INDEPENDENT_AMBULATORY_CARE_PROVIDER_SITE_OTHER): Payer: Self-pay | Admitting: Internal Medicine

## 2017-02-22 NOTE — Telephone Encounter (Signed)
noted 

## 2017-02-22 NOTE — Telephone Encounter (Signed)
Patient stated that she is 100% better, but she is still messing up at night.  She wanted to let Terri know.  [phone]

## 2017-05-21 ENCOUNTER — Other Ambulatory Visit (INDEPENDENT_AMBULATORY_CARE_PROVIDER_SITE_OTHER): Payer: Self-pay | Admitting: Internal Medicine

## 2017-07-01 ENCOUNTER — Other Ambulatory Visit: Payer: Self-pay | Admitting: Orthopedic Surgery

## 2017-07-01 DIAGNOSIS — M79641 Pain in right hand: Secondary | ICD-10-CM | POA: Insufficient documentation

## 2017-08-09 ENCOUNTER — Encounter (HOSPITAL_BASED_OUTPATIENT_CLINIC_OR_DEPARTMENT_OTHER): Payer: Self-pay

## 2017-08-09 ENCOUNTER — Ambulatory Visit (HOSPITAL_BASED_OUTPATIENT_CLINIC_OR_DEPARTMENT_OTHER): Admit: 2017-08-09 | Payer: Medicare Other | Admitting: Orthopedic Surgery

## 2017-08-09 SURGERY — EXCISION MASS
Anesthesia: Regional | Laterality: Right

## 2017-08-25 ENCOUNTER — Other Ambulatory Visit (INDEPENDENT_AMBULATORY_CARE_PROVIDER_SITE_OTHER): Payer: Self-pay | Admitting: Internal Medicine

## 2017-11-14 ENCOUNTER — Other Ambulatory Visit (INDEPENDENT_AMBULATORY_CARE_PROVIDER_SITE_OTHER): Payer: Self-pay | Admitting: Internal Medicine

## 2017-11-21 ENCOUNTER — Ambulatory Visit (INDEPENDENT_AMBULATORY_CARE_PROVIDER_SITE_OTHER): Payer: Medicare Other | Admitting: Internal Medicine

## 2017-11-21 ENCOUNTER — Encounter (INDEPENDENT_AMBULATORY_CARE_PROVIDER_SITE_OTHER): Payer: Self-pay | Admitting: Internal Medicine

## 2017-11-21 VITALS — BP 180/90 | HR 60 | Temp 97.5°F | Ht 62.0 in | Wt 178.9 lb

## 2017-11-21 DIAGNOSIS — K219 Gastro-esophageal reflux disease without esophagitis: Secondary | ICD-10-CM

## 2017-11-21 DIAGNOSIS — K580 Irritable bowel syndrome with diarrhea: Secondary | ICD-10-CM | POA: Diagnosis not present

## 2017-11-21 NOTE — Progress Notes (Signed)
Subjective:    Patient ID: [name], female    DOB: 10/20/1945, 72 y.o.   MRN: [id number]  HPI Here today for f/u. Last seen in June of 2018. Hx of chronic GERD. She tells me she is not doing good. She continues to have stomach cramps and diarrhea.  She says she is in [place] all the time. She does not make it to the bathroom. She is taking the Dicyclomine once a day. Her appetite is good.  GERD is controlled for the most part. If she eats something off her diet, she will have GERD.  Her last colonoscopy was in 2010 (Chronic diarrhea) Normal terminal ileum. No endoscopic evidence of colitis. Few scattered diverticula at transverse and descending colon. Small rectal polyp ablated via cold biopsy.  Last EGD was in January 2010 when she presented with upper GI bleed. She was taking too many Aleve She had gastric ulcer at antrum. She also had multiple gastric erosions.   Review of Systems   Past Medical History:  Diagnosis Date  . Anxiety   . Benign essential tremor   . Cardiac murmur    Reportedly for years-mild  . Chronic diarrhea   . Degenerative joint disease   . Diabetes mellitus   . Diabetic neuropathy ([place])   . Diverticulosis   . Essential hypertension, benign   . GERD (gastroesophageal reflux disease)   . GI bleed    NSAID use  . PONV (postoperative nausea and vomiting)   . Seasonal allergies   . Shortness of breath   . TIA (transient ischemic attack)    1998  . Type 2 diabetes mellitus ([place])   . Urinary incontinence     Past Surgical History:  Procedure Laterality Date  . ABDOMINAL HYSTERECTOMY    . APPENDECTOMY    . Arthroscopic left shoulder surgery    . BREAST SURGERY     lumpectomy X 2-benign  . CARPAL TUNNEL RELEASE Left 10/03/2012   Procedure: CARPAL TUNNEL RELEASE;  Surgeon: Cammie Sickle., MD;  Location: [facility];  Service: Orthopedics;  Laterality: Left;  . CHOLECYSTECTOMY    . COLON SURGERY    . COLONOSCOPY  03/03/09  .  COLONOSCOPY  09/16/2004   ROURK  . Colostomy with reversal    . ESOPHAGOGASTRODUODENOSCOPY     EGD 06/08/2008  . ESOPHAGOGASTRODUODENOSCOPY  09/16/04   DIAG EGD W/TCS  . REPLACEMENT TOTAL KNEE BILATERAL  2001, 2005  . Right elbow surgery    . TENDON RECONSTRUCTION Left 10/03/2012   Procedure: LEFT MEDIAL ELBOW RECONSTRUCTION;  Surgeon: Cammie Sickle., MD;  Location: [facility];  Service: Orthopedics;  Laterality: Left;  . TONSILLECTOMY      Allergies  Allergen Reactions  . Codeine     "Increases heart rate"   . Morphine And Related     Hallucinations   . Nsaids     Hx  GI bleed  . Valium [Diazepam] Other (See Comments)    Patient states that this medication made her want to commit sucide    Current Outpatient Medications on File Prior to Visit  Medication Sig Dispense Refill  . cetirizine (ZYRTEC) 10 MG tablet Take 10 mg by mouth daily.    . citalopram (CELEXA) 10 MG tablet Take 10 mg by mouth daily.    . clonazePAM (KLONOPIN) 0.5 MG tablet Take 0.5 mg by mouth 2 (two) times daily.     Marland Kitchen dicyclomine (BENTYL) 20 MG tablet TAKE  1 TABLET EVERY DAY BEFORE BREAKFAST 90 tablet 3  . diltiazem (CARDIZEM CD) 180 MG 24 hr capsule Take 180 mg by mouth daily.      . ergocalciferol (VITAMIN D2) 50000 UNITS capsule Take by mouth once a week.     . gabapentin (NEURONTIN) 100 MG capsule Take 100 mg by mouth at bedtime.    Marland Kitchen glipiZIDE (GLUCOTROL XL) 5 MG 24 hr tablet Take 10 mg by mouth daily.   4  . Lactobacillus-Inulin (CULTURELLE DIGESTIVE HEALTH PO) Take by mouth.    . levothyroxine (SYNTHROID, LEVOTHROID) 50 MCG tablet Take 50 mcg by mouth daily.    Marland Kitchen lisinopril-hydrochlorothiazide (PRINZIDE,ZESTORETIC) 10-12.5 MG per tablet Take 1 tablet by mouth daily.      Marland Kitchen loratadine (CLARITIN) 10 MG tablet Take 10 mg by mouth daily.    Marland Kitchen lovastatin (MEVACOR) 20 MG tablet Take 20 mg by mouth at bedtime.    . metFORMIN (GLUCOPHAGE) 500 MG tablet Take 500 mg by mouth. 2 am and 1 in  pm  4  . pantoprazole (PROTONIX) 40 MG tablet TAKE 1 TABLET BY MOUTH EVERY MORNING 90 tablet 3  . pioglitazone (ACTOS) 15 MG tablet Take 15 mg by mouth daily.    . propranolol (INDERAL LA) 120 MG 24 hr capsule Take 120 mg by mouth daily.     . ranitidine (ZANTAC) 15 MG/ML syrup Take by mouth 2 (two) times daily.    . diphenoxylate-atropine (LOMOTIL) 2.5-0.025 MG tablet Take 1 tablet by mouth as needed. Ulcer stomach 30 tablet 0  . traMADol (ULTRAM) 50 MG tablet Take 50 mg by mouth every 6 (six) hours as needed. For pain     No current facility-administered medications on file prior to visit.         Objective:   Physical Exam Blood pressure (!) 180/90, pulse 60, temperature (!) 97.5 F (36.4 C), height 5\' 2"  (1.575 m), weight 178 lb 14.4 oz (81.1 kg). Alert and oriented. Skin warm and dry. Oral mucosa is moist.   . Sclera anicteric, conjunctivae is pink. Thyroid not enlarged. No cervical lymphadenopathy. Lungs clear. Heart regular rate and rhythm.  Abdomen is soft. Bowel sounds are positive. No hepatomegaly. No abdominal masses felt. No tenderness.  No edema to lower extremities.           Assessment & Plan:  IBS diarrhea. May take the Dicyclomine BID instead of daily. GERD: Continue the Zantac and Protonix.

## 2017-11-21 NOTE — Patient Instructions (Signed)
Dicyclomine BID. Continue Protonix and Zantact

## 2018-06-08 ENCOUNTER — Encounter: Payer: Self-pay | Admitting: Neurology

## 2018-06-08 ENCOUNTER — Ambulatory Visit (INDEPENDENT_AMBULATORY_CARE_PROVIDER_SITE_OTHER): Payer: 59 | Admitting: Neurology

## 2018-06-08 VITALS — BP 144/80 | HR 79 | Ht 62.0 in | Wt 167.0 lb

## 2018-06-08 DIAGNOSIS — G250 Essential tremor: Secondary | ICD-10-CM

## 2018-06-08 NOTE — Patient Instructions (Addendum)
You have an advanced hand tremor and with your family history in mind, you like have an advanced essential tremor.   You have tried mysoline and Inderal already, but could not take these meds.   I would not favor any other medication trials for tremor in your case for fear of side effects. You may not be a good candidate for DBS (deep brain stimulation).   I will see you back as needed.

## 2018-06-08 NOTE — Progress Notes (Signed)
Subjective:    Patient ID: [name] is a 73 y.o. female.  HPI     Star Age, MD, PhD [facility] [address].O. [place], [place] [address]  Dear Rennis Petty,   I saw your patient, [name], upon your kind request, in my neurologic clinic today for initial consultation of her tremors. The patient is accompanied by her daughter today. As you know, [name] is a 73 year old right-handed woman with an underlying complex medical history of diabetes, seasonal allergies, reflux disease, hypertension, neuropathy, degenerative joint disease, anxiety, history of TIA, diverticulosis, and obesity, who reports a longer standing history of bilateral hand tremors, over 15 years, likely most her adult life. I reviewed your office note from 03/27/2018, which you kindly included. She had recently been hospitalized for a syncopal spell as I understand. Of note, she is on gabapentin 100 mg qHS, clonazepam 0.5 mg twice a day, tramadol as needed qd, Celexa 10 mg daily.  Her daughter reports that at the time of her hospitalization in October 2019 she had fallen, she had hypoglycemia. She also was felt to be overmedicated. She was on a higher dose of gabapentin and clonazepam and tramadol at the time, these have been scaled back. Of note, daughter reports that she was tried on Inderal for some time, she was taken off of it after she had her hospitalization last year. She was also tried on Mysoline in the past, briefly as I understand, but was taken off of it. She has a history of recurrent falls and history of hypoglycemia, dehydration. She has a family history of tremors affecting both parents as she recalls, both had hand tremors. She has an older sister who is 29 with hand tremors and her youngest sister is 55 but she does not have close contact with her, that sister also reports tremors. She has one son and one daughter, daughter has hand tremors as well. The  patient quit smoking in 1999, she does not utilize any alcohol and drinks very little caffeine, not daily. She does not always hydrate well per daughter. She had seen a neurologist many years ago, maybe 10 years ago and was deemed a reasonably good candidate for DBS, she did not wish to consider DBS and does not wish to consider it now.  Her Past Medical History Is Significant For: Past Medical History:  Diagnosis Date  . Anxiety   . Benign essential tremor   . Cardiac murmur    Reportedly for years-mild  . Chronic diarrhea   . Degenerative joint disease   . Diabetes mellitus   . Diabetic neuropathy ([place])   . Diverticulosis   . Essential hypertension, benign   . GERD (gastroesophageal reflux disease)   . GI bleed    NSAID use  . PONV (postoperative nausea and vomiting)   . Seasonal allergies   . Shortness of breath   . TIA (transient ischemic attack)    1998  . Type 2 diabetes mellitus ([place])   . Urinary incontinence     Her Past Surgical History Is Significant For: Past Surgical History:  Procedure Laterality Date  . ABDOMINAL HYSTERECTOMY    . APPENDECTOMY    . Arthroscopic left shoulder surgery    . BREAST SURGERY     lumpectomy X 2-benign  . CARPAL TUNNEL RELEASE Left 10/03/2012   Procedure: CARPAL TUNNEL RELEASE;  Surgeon: Cammie Sickle., MD;  Location: [facility];  Service: Orthopedics;  Laterality: Left;  . CHOLECYSTECTOMY    . COLON SURGERY    . COLONOSCOPY  03/03/09  . COLONOSCOPY  09/16/2004   ROURK  . Colostomy with reversal    . ESOPHAGOGASTRODUODENOSCOPY     EGD 06/08/2008  . ESOPHAGOGASTRODUODENOSCOPY  09/16/04   DIAG EGD W/TCS  . REPLACEMENT TOTAL KNEE BILATERAL  2001, 2005  . Right elbow surgery    . TENDON RECONSTRUCTION Left 10/03/2012   Procedure: LEFT MEDIAL ELBOW RECONSTRUCTION;  Surgeon: Cammie Sickle., MD;  Location: [facility];  Service: Orthopedics;  Laterality: Left;  . TONSILLECTOMY      Her Family  History Is Significant For: Family History  Problem Relation Age of Onset  . Cancer Father        Leukemia  . Colon cancer Neg Hx     Her Social History Is Significant For: Social History   Socioeconomic History  . Marital status: Married    Spouse name: Not on file  . Number of children: Not on file  . Years of education: Not on file  . Highest education level: Not on file  Occupational History  . Occupation: Disabled  Social Needs  . Financial resource strain: Not on file  . Food insecurity:    Worry: Not on file    Inability: Not on file  . Transportation needs:    Medical: Not on file    Non-medical: Not on file  Tobacco Use  . Smoking status: Former Smoker    Packs/day: 2.00    Years: 40.00    Pack years: 80.00    Types: Cigarettes    Last attempt to quit: 11/02/1997    Years since quitting: 20.6  . Smokeless tobacco: Never Used  . Tobacco comment: quit June 6f 1999  Substance and Sexual Activity  . Alcohol use: No    Alcohol/week: 0.0 standard drinks  . Drug use: No  . Sexual activity: Not on file  Lifestyle  . Physical activity:    Days per week: Not on file    Minutes per session: Not on file  . Stress: Not on file  Relationships  . Social connections:    Talks on phone: Not on file    Gets together: Not on file    Attends religious service: Not on file    Active member of club or organization: Not on file    Attends meetings of clubs or organizations: Not on file    Relationship status: Not on file  Other Topics Concern  . Not on file  Social History Narrative  . Not on file    Her Allergies Are:  Allergies  Allergen Reactions  . Codeine     "Increases heart rate"   . Morphine And Related     Hallucinations   . Nsaids     Hx  GI bleed  . Valium [Diazepam] Other (See Comments)    Patient states that this medication made her want to commit sucide  :   Her Current Medications Are:  Outpatient Encounter Medications as of 06/08/2018   Medication Sig  . citalopram (CELEXA) 10 MG tablet Take 10 mg by mouth daily.  . clonazePAM (KLONOPIN) 0.5 MG tablet Take 0.5 mg by mouth 2 (two) times daily.   Marland Kitchen dicyclomine (BENTYL) 20 MG tablet TAKE 1 TABLET EVERY DAY BEFORE BREAKFAST  . diltiazem (CARDIZEM CD) 180 MG 24 hr capsule Take 180 mg by mouth daily.    Marland Kitchen levothyroxine (SYNTHROID, LEVOTHROID) 50  MCG tablet Take 50 mcg by mouth daily.  Marland Kitchen lisinopril-hydrochlorothiazide (PRINZIDE,ZESTORETIC) 20-12.5 MG tablet Take 1 tablet by mouth daily.   Marland Kitchen lovastatin (MEVACOR) 20 MG tablet Take 20 mg by mouth at bedtime.  . diphenoxylate-atropine (LOMOTIL) 2.5-0.025 MG tablet Take 1 tablet by mouth as needed. Ulcer stomach  . ergocalciferol (VITAMIN D2) 50000 UNITS capsule Take by mouth once a week.   . gabapentin (NEURONTIN) 100 MG capsule Take 100 mg by mouth at bedtime.  Marland Kitchen glipiZIDE (GLUCOTROL XL) 5 MG 24 hr tablet Take 10 mg by mouth daily.   . Lactobacillus-Inulin ([place] PO) Take by mouth.  . loratadine (CLARITIN) 10 MG tablet Take 10 mg by mouth daily as needed.   . metFORMIN (GLUCOPHAGE) 500 MG tablet Take 500 mg by mouth. 2 am and 1 in pm  . pantoprazole (PROTONIX) 40 MG tablet TAKE 1 TABLET BY MOUTH EVERY MORNING  . pioglitazone (ACTOS) 15 MG tablet Take 15 mg by mouth daily.  . traMADol (ULTRAM) 50 MG tablet Take 50 mg by mouth every 6 (six) hours as needed. For pain  . [DISCONTINUED] cetirizine (ZYRTEC) 10 MG tablet Take 10 mg by mouth daily.  . [DISCONTINUED] propranolol (INDERAL LA) 120 MG 24 hr capsule Take 120 mg by mouth daily.   . [DISCONTINUED] ranitidine (ZANTAC) 15 MG/ML syrup Take by mouth 2 (two) times daily.   No facility-administered encounter medications on file as of 06/08/2018.   :   Review of Systems:  Out of a complete 14 point review of systems, all are reviewed and negative with the exception of these symptoms as listed below:  Review of Systems  Neurological:       Pt presents today  tremors bilateral hands and feels like her whole body is quivering. She states this has been going on for years. Pt is right handed.    Objective:  Neurological Exam  Physical Exam Physical Examination:   Vitals:   06/08/18 1433  BP: (!) 144/80  Pulse: 79    General Examination: The patient is a very pleasant 73 y.o. female in no acute distress. She appears well-developed and well-nourished and well groomed.   HEENT: Normocephalic, atraumatic, pupils are equal, round and reactive to light and accommodation. she has corrective eyeglasses in place. She has a very slight intermittent head tremor. She has no significant voice tremor. Face is symmetric, normal facial animation, airway examination is fairly benign, mild mouth dryness noted, tongue protrudes centrally and palate elevates symmetrically, no carotid bruits noted.hearing is grossly intact.  Chest: Clear to auscultation without wheezing, rhonchi or crackles noted.  Heart: S1+S2+0, regular and normal without murmurs, rubs or gallops noted.   Abdomen: Soft, non-tender and non-distended with normal bowel sounds appreciated on auscultation.  Extremities: There is 1+ pitting edema in the distal lower extremities bilaterally.   Skin: Warm and dry without trophic changes noted.  Musculoskeletal: exam reveals no obvious joint deformities, tenderness or joint swelling or erythema.   Neurologically:  Mental status: The patient is awake, alert and oriented in all 4 spheres. Her immediate and remote memory, attention, language skills and fund of knowledge are appropriate. There is no evidence of aphasia, agnosia, apraxia or anomia. Speech is clear with normal prosody and enunciation. Thought process is linear. Mood is normal and affect is normal.  Cranial nerves II - XII are as described above under HEENT exam. In addition: shoulder shrug is normal with equal shoulder height noted. Motor exam: Normal bulk, strength and tone  is noted. There  is no drift, or rebound.  On 06/08/2018: on Archimedes spiral drawing she has moderate trembling with the right and left hand. And writing with her right hand which is her dominant hand is quite tremulous, legible, not micrographic. She has a bilateral moderate postural and action tremor, no significant lateralization, she has a mild resting component in both hands, she has no lower extremity tremor.  Romberg is negative. Reflexes are 1+In the upper extremities and absent in the lower extremities. Fine motor skills and coordination: Globally mildly impaired without lateralization. Cerebellar testing: No dysmetria or intention tremor.  Sensory exam: intact to light touch, pinprick, vibration,And temperature sense in the upper extremities but decrease to all modalities up to mid calf area or higher in the lower extremities bilaterally, symmetrically. Gait, station and balance: She stands With difficulty and pushes herself up. She uses a single-point cane in the right hand. She walks slowly and cautiously, no obvious limp, no shuffling.   Assessment and Plan:   In summary, ZANYA LINDO is a very pleasant 73 y.o.-year old female with an underlying complex medical history of diabetes, seasonal allergies, reflux disease, hypertension, neuropathy, degenerative joint disease, anxiety, history of TIA, diverticulosis, and obesity, who presents for evaluation of her tremors. She has a long-standing history of tremors affecting both hands and a family history of tremors. Her history and examination are in keeping with advanced essential tremor. She does not have telltale signs of parkinsonism. She is reassured in that regard. Unfortunately however, she has already tried the first line medications we try for symptomatic treatment of essential tremor namely propranolol and Mysoline. She was recently hospitalized for syncopal event versus hypoglycemia versus dehydration as I understand. She may have been also  overmedicated at the time per daughter's report. Since then, some of her potentially sedating medications have been reduced. I would also favor streamlining her sedating medications. Unfortunately, she does have a history of falls and balance issues and I would not recommend any other medication trials for symptomatic reduction of her tremors. Unfortunately, she may not be a good candidate for DBS surgery and certainly is not keen on seeking surgical treatment for tremors. At this juncture, I suggested she follow-up with you routinely, unfortunately there is not a whole lot I can add at this time. I explained my findings and reasoning to the patient and her daughter and they voiced Understanding and agreement. Thank you very much for allowing me to participate in the care of this nice patient. If I can be of any further assistance to you please do not hesitate to call me at [phone].  Sincerely,   Star Age, MD, PhD

## 2018-08-06 ENCOUNTER — Other Ambulatory Visit (INDEPENDENT_AMBULATORY_CARE_PROVIDER_SITE_OTHER): Payer: Self-pay | Admitting: Internal Medicine

## 2018-10-25 ENCOUNTER — Other Ambulatory Visit (INDEPENDENT_AMBULATORY_CARE_PROVIDER_SITE_OTHER): Payer: Self-pay | Admitting: Internal Medicine

## 2018-11-23 ENCOUNTER — Ambulatory Visit (INDEPENDENT_AMBULATORY_CARE_PROVIDER_SITE_OTHER): Payer: 59 | Admitting: Internal Medicine

## 2018-11-23 ENCOUNTER — Encounter (INDEPENDENT_AMBULATORY_CARE_PROVIDER_SITE_OTHER): Payer: Self-pay | Admitting: Internal Medicine

## 2018-11-23 ENCOUNTER — Other Ambulatory Visit: Payer: Self-pay

## 2018-11-23 VITALS — BP 159/59 | HR 73 | Temp 98.2°F | Ht 62.0 in | Wt 159.7 lb

## 2018-11-23 DIAGNOSIS — K219 Gastro-esophageal reflux disease without esophagitis: Secondary | ICD-10-CM

## 2018-11-23 NOTE — Patient Instructions (Signed)
OV in 1 year.  

## 2018-11-23 NOTE — Progress Notes (Signed)
Subjective:    Patient ID: [name], female    DOB: 01-30-1946, 73 y.o.   MRN: [id number]  HPI Here today for f/u. Last seen in July of 2019. Hx of chronic GERD. GERD controlled with Protonix.  She has lost from 178 to 159.7 which was unintentional. Her appetite is good.  She has a BM daily. No melena or BRRB.    04/01/2011 EGD/ED: Dysphagia:  Normal esophagogastroduodenoscopy. Esophagus dilated by passing 54 and 56 French Maloney dilators but no mucosal disruption induced. Suspect she may have esophageal motility disorder.   EGD was in January 2010 when she presented with upper GI bleed. She was taking too many Aleve She had gastric ulcer at antrum. She also had multiple gastric erosions.    Review of Systems Past Medical History:  Diagnosis Date  . Anxiety   . Benign essential tremor   . Cardiac murmur    Reportedly for years-mild  . Chronic diarrhea   . Degenerative joint disease   . Diabetes mellitus   . Diabetic neuropathy ([place])   . Diverticulosis   . Essential hypertension, benign   . GERD (gastroesophageal reflux disease)   . GI bleed    NSAID use  . PONV (postoperative nausea and vomiting)   . Seasonal allergies   . Shortness of breath   . TIA (transient ischemic attack)    1998  . Type 2 diabetes mellitus ([place])   . Urinary incontinence     Past Surgical History:  Procedure Laterality Date  . ABDOMINAL HYSTERECTOMY    . APPENDECTOMY    . Arthroscopic left shoulder surgery    . BREAST SURGERY     lumpectomy X 2-benign  . CARPAL TUNNEL RELEASE Left 10/03/2012   Procedure: CARPAL TUNNEL RELEASE;  Surgeon: Cammie Sickle., MD;  Location: [facility];  Service: Orthopedics;  Laterality: Left;  . CHOLECYSTECTOMY    . COLON SURGERY    . COLONOSCOPY  03/03/09  . COLONOSCOPY  09/16/2004   ROURK  . Colostomy with reversal    . ESOPHAGOGASTRODUODENOSCOPY     EGD 06/08/2008  . ESOPHAGOGASTRODUODENOSCOPY  09/16/04   DIAG EGD W/TCS   . REPLACEMENT TOTAL KNEE BILATERAL  2001, 2005  . Right elbow surgery    . TENDON RECONSTRUCTION Left 10/03/2012   Procedure: LEFT MEDIAL ELBOW RECONSTRUCTION;  Surgeon: Cammie Sickle., MD;  Location: [facility];  Service: Orthopedics;  Laterality: Left;  . TONSILLECTOMY      Allergies  Allergen Reactions  . Codeine     "Increases heart rate"   . Morphine And Related     Hallucinations   . Nsaids     Hx  GI bleed  . Valium [Diazepam] Other (See Comments)    Patient states that this medication made her want to commit sucide    Current Outpatient Medications on File Prior to Visit  Medication Sig Dispense Refill  . citalopram (CELEXA) 10 MG tablet Take 10 mg by mouth daily.    . clonazePAM (KLONOPIN) 0.5 MG tablet Take 0.5 mg by mouth 2 (two) times daily.     Marland Kitchen dicyclomine (BENTYL) 20 MG tablet TAKE 1 TABLET EVERY DAY BEFORE BREAKFAST 90 tablet 3  . diltiazem (CARDIZEM CD) 180 MG 24 hr capsule Take 180 mg by mouth daily.      . diphenoxylate-atropine (LOMOTIL) 2.5-0.025 MG tablet Take 1 tablet by mouth as needed. Ulcer stomach 30 tablet 0  . ergocalciferol (VITAMIN D2)  50000 UNITS capsule Take by mouth once a week.     . gabapentin (NEURONTIN) 100 MG capsule Take 100 mg by mouth at bedtime.    Marland Kitchen glipiZIDE (GLUCOTROL XL) 5 MG 24 hr tablet Take 10 mg by mouth daily.   4  . Lactobacillus-Inulin (CULTURELLE DIGESTIVE HEALTH PO) Take by mouth.    . levothyroxine (SYNTHROID, LEVOTHROID) 50 MCG tablet Take 50 mcg by mouth daily.    Marland Kitchen lisinopril-hydrochlorothiazide (PRINZIDE,ZESTORETIC) 20-12.5 MG tablet Take 1 tablet by mouth daily.     . metFORMIN (GLUCOPHAGE) 500 MG tablet Take 500 mg by mouth. 2 am and 1 in pm  4  . metoprolol succinate (TOPROL-XL) 25 MG 24 hr tablet Take 25 mg by mouth daily.    . pantoprazole (PROTONIX) 40 MG tablet TAKE 1 TABLET BY MOUTH EVERY DAY IN THE MORNING 90 tablet 3  . pioglitazone (ACTOS) 15 MG tablet Take 15 mg by mouth daily.    .  traMADol (ULTRAM) 50 MG tablet Take 50 mg by mouth every 6 (six) hours as needed. For pain    . loratadine (CLARITIN) 10 MG tablet Take 10 mg by mouth daily as needed.     . lovastatin (MEVACOR) 20 MG tablet Take 20 mg by mouth at bedtime.     No current facility-administered medications on file prior to visit.         Objective:   Physical Exam Blood pressure (!) 159/59, pulse 73, temperature 98.2 F (36.8 C), height 5\' 2"  (1.575 m), weight 159 lb 11.2 oz (72.4 kg). Alert and oriented. Skin warm and dry. Oral mucosa is moist.   . Sclera anicteric, conjunctivae is pink. Thyroid not enlarged. No cervical lymphadenopathy. Lungs clear. Heart regular rate and rhythm.  Abdomen is soft. Bowel sounds are positive. No hepatomegaly. No abdominal masses felt. No tenderness.  No edema to lower extremities.          Assessment & Plan:  GERD. Continue the Protonix. OV in 1 year.

## 2019-03-21 ENCOUNTER — Encounter (INDEPENDENT_AMBULATORY_CARE_PROVIDER_SITE_OTHER): Payer: Self-pay | Admitting: *Deleted

## 2019-06-04 DIAGNOSIS — N289 Disorder of kidney and ureter, unspecified: Secondary | ICD-10-CM | POA: Diagnosis not present

## 2019-06-06 DIAGNOSIS — E875 Hyperkalemia: Secondary | ICD-10-CM | POA: Diagnosis not present

## 2019-06-06 DIAGNOSIS — N183 Chronic kidney disease, stage 3 unspecified: Secondary | ICD-10-CM | POA: Diagnosis not present

## 2019-06-06 DIAGNOSIS — Z299 Encounter for prophylactic measures, unspecified: Secondary | ICD-10-CM | POA: Diagnosis not present

## 2019-06-06 DIAGNOSIS — I10 Essential (primary) hypertension: Secondary | ICD-10-CM | POA: Diagnosis not present

## 2019-06-06 DIAGNOSIS — E1165 Type 2 diabetes mellitus with hyperglycemia: Secondary | ICD-10-CM | POA: Diagnosis not present

## 2019-06-22 DIAGNOSIS — E7800 Pure hypercholesterolemia, unspecified: Secondary | ICD-10-CM | POA: Diagnosis not present

## 2019-06-22 DIAGNOSIS — I10 Essential (primary) hypertension: Secondary | ICD-10-CM | POA: Diagnosis not present

## 2019-06-22 DIAGNOSIS — E119 Type 2 diabetes mellitus without complications: Secondary | ICD-10-CM | POA: Diagnosis not present

## 2019-07-06 DIAGNOSIS — E1165 Type 2 diabetes mellitus with hyperglycemia: Secondary | ICD-10-CM | POA: Diagnosis not present

## 2019-07-06 DIAGNOSIS — I10 Essential (primary) hypertension: Secondary | ICD-10-CM | POA: Diagnosis not present

## 2019-07-06 DIAGNOSIS — I700 Atherosclerosis of aorta: Secondary | ICD-10-CM | POA: Diagnosis not present

## 2019-07-06 DIAGNOSIS — Z299 Encounter for prophylactic measures, unspecified: Secondary | ICD-10-CM | POA: Diagnosis not present

## 2019-07-10 DIAGNOSIS — E119 Type 2 diabetes mellitus without complications: Secondary | ICD-10-CM | POA: Diagnosis not present

## 2019-07-10 DIAGNOSIS — E7800 Pure hypercholesterolemia, unspecified: Secondary | ICD-10-CM | POA: Diagnosis not present

## 2019-07-10 DIAGNOSIS — I10 Essential (primary) hypertension: Secondary | ICD-10-CM | POA: Diagnosis not present

## 2019-07-31 DIAGNOSIS — I447 Left bundle-branch block, unspecified: Secondary | ICD-10-CM | POA: Diagnosis not present

## 2019-07-31 DIAGNOSIS — K5730 Diverticulosis of large intestine without perforation or abscess without bleeding: Secondary | ICD-10-CM | POA: Diagnosis not present

## 2019-07-31 DIAGNOSIS — R197 Diarrhea, unspecified: Secondary | ICD-10-CM | POA: Diagnosis not present

## 2019-07-31 DIAGNOSIS — T68XXXA Hypothermia, initial encounter: Secondary | ICD-10-CM | POA: Diagnosis not present

## 2019-07-31 DIAGNOSIS — R0689 Other abnormalities of breathing: Secondary | ICD-10-CM | POA: Diagnosis not present

## 2019-07-31 DIAGNOSIS — R1084 Generalized abdominal pain: Secondary | ICD-10-CM | POA: Diagnosis not present

## 2019-07-31 DIAGNOSIS — Z743 Need for continuous supervision: Secondary | ICD-10-CM | POA: Diagnosis not present

## 2019-07-31 DIAGNOSIS — R112 Nausea with vomiting, unspecified: Secondary | ICD-10-CM | POA: Diagnosis not present

## 2019-07-31 DIAGNOSIS — R1110 Vomiting, unspecified: Secondary | ICD-10-CM | POA: Diagnosis not present

## 2019-07-31 DIAGNOSIS — D72829 Elevated white blood cell count, unspecified: Secondary | ICD-10-CM | POA: Diagnosis not present

## 2019-08-01 DIAGNOSIS — K5730 Diverticulosis of large intestine without perforation or abscess without bleeding: Secondary | ICD-10-CM | POA: Diagnosis not present

## 2019-08-01 DIAGNOSIS — D72829 Elevated white blood cell count, unspecified: Secondary | ICD-10-CM | POA: Diagnosis not present

## 2019-08-01 DIAGNOSIS — Z8673 Personal history of transient ischemic attack (TIA), and cerebral infarction without residual deficits: Secondary | ICD-10-CM | POA: Diagnosis not present

## 2019-08-01 DIAGNOSIS — E119 Type 2 diabetes mellitus without complications: Secondary | ICD-10-CM | POA: Diagnosis not present

## 2019-08-01 DIAGNOSIS — Z20822 Contact with and (suspected) exposure to covid-19: Secondary | ICD-10-CM | POA: Diagnosis not present

## 2019-08-01 DIAGNOSIS — I10 Essential (primary) hypertension: Secondary | ICD-10-CM | POA: Diagnosis not present

## 2019-08-01 DIAGNOSIS — E860 Dehydration: Secondary | ICD-10-CM | POA: Diagnosis not present

## 2019-08-01 DIAGNOSIS — R112 Nausea with vomiting, unspecified: Secondary | ICD-10-CM | POA: Diagnosis not present

## 2019-08-01 DIAGNOSIS — Z7984 Long term (current) use of oral hypoglycemic drugs: Secondary | ICD-10-CM | POA: Diagnosis not present

## 2019-08-01 DIAGNOSIS — N179 Acute kidney failure, unspecified: Secondary | ICD-10-CM | POA: Diagnosis not present

## 2019-08-01 DIAGNOSIS — A09 Infectious gastroenteritis and colitis, unspecified: Secondary | ICD-10-CM | POA: Diagnosis not present

## 2019-08-01 DIAGNOSIS — R197 Diarrhea, unspecified: Secondary | ICD-10-CM | POA: Diagnosis not present

## 2019-08-01 DIAGNOSIS — R1110 Vomiting, unspecified: Secondary | ICD-10-CM | POA: Diagnosis not present

## 2019-08-01 DIAGNOSIS — K219 Gastro-esophageal reflux disease without esophagitis: Secondary | ICD-10-CM | POA: Diagnosis not present

## 2019-08-01 DIAGNOSIS — Z87891 Personal history of nicotine dependence: Secondary | ICD-10-CM | POA: Diagnosis not present

## 2019-08-10 DIAGNOSIS — A09 Infectious gastroenteritis and colitis, unspecified: Secondary | ICD-10-CM | POA: Diagnosis not present

## 2019-08-10 DIAGNOSIS — E1165 Type 2 diabetes mellitus with hyperglycemia: Secondary | ICD-10-CM | POA: Diagnosis not present

## 2019-08-10 DIAGNOSIS — Z299 Encounter for prophylactic measures, unspecified: Secondary | ICD-10-CM | POA: Diagnosis not present

## 2019-08-10 DIAGNOSIS — Z09 Encounter for follow-up examination after completed treatment for conditions other than malignant neoplasm: Secondary | ICD-10-CM | POA: Diagnosis not present

## 2019-08-10 DIAGNOSIS — R110 Nausea: Secondary | ICD-10-CM | POA: Diagnosis not present

## 2019-09-05 DIAGNOSIS — S39012A Strain of muscle, fascia and tendon of lower back, initial encounter: Secondary | ICD-10-CM | POA: Diagnosis not present

## 2019-09-05 DIAGNOSIS — E119 Type 2 diabetes mellitus without complications: Secondary | ICD-10-CM | POA: Diagnosis not present

## 2019-09-05 DIAGNOSIS — E7800 Pure hypercholesterolemia, unspecified: Secondary | ICD-10-CM | POA: Diagnosis not present

## 2019-09-05 DIAGNOSIS — M549 Dorsalgia, unspecified: Secondary | ICD-10-CM | POA: Diagnosis not present

## 2019-09-05 DIAGNOSIS — I10 Essential (primary) hypertension: Secondary | ICD-10-CM | POA: Diagnosis not present

## 2019-09-05 DIAGNOSIS — Z713 Dietary counseling and surveillance: Secondary | ICD-10-CM | POA: Diagnosis not present

## 2019-09-05 DIAGNOSIS — Z299 Encounter for prophylactic measures, unspecified: Secondary | ICD-10-CM | POA: Diagnosis not present

## 2019-09-17 DIAGNOSIS — Z7189 Other specified counseling: Secondary | ICD-10-CM | POA: Diagnosis not present

## 2019-09-17 DIAGNOSIS — Z87891 Personal history of nicotine dependence: Secondary | ICD-10-CM | POA: Diagnosis not present

## 2019-09-17 DIAGNOSIS — Z299 Encounter for prophylactic measures, unspecified: Secondary | ICD-10-CM | POA: Diagnosis not present

## 2019-09-17 DIAGNOSIS — I10 Essential (primary) hypertension: Secondary | ICD-10-CM | POA: Diagnosis not present

## 2019-09-17 DIAGNOSIS — Z1211 Encounter for screening for malignant neoplasm of colon: Secondary | ICD-10-CM | POA: Diagnosis not present

## 2019-09-17 DIAGNOSIS — Z0000 Encounter for general adult medical examination without abnormal findings: Secondary | ICD-10-CM | POA: Diagnosis not present

## 2019-09-17 DIAGNOSIS — Z79899 Other long term (current) drug therapy: Secondary | ICD-10-CM | POA: Diagnosis not present

## 2019-09-17 DIAGNOSIS — E7800 Pure hypercholesterolemia, unspecified: Secondary | ICD-10-CM | POA: Diagnosis not present

## 2019-09-17 DIAGNOSIS — E039 Hypothyroidism, unspecified: Secondary | ICD-10-CM | POA: Diagnosis not present

## 2019-09-17 DIAGNOSIS — R5383 Other fatigue: Secondary | ICD-10-CM | POA: Diagnosis not present

## 2019-09-25 DIAGNOSIS — H903 Sensorineural hearing loss, bilateral: Secondary | ICD-10-CM | POA: Diagnosis not present

## 2019-09-25 DIAGNOSIS — H6983 Other specified disorders of Eustachian tube, bilateral: Secondary | ICD-10-CM | POA: Diagnosis not present

## 2019-09-25 DIAGNOSIS — R42 Dizziness and giddiness: Secondary | ICD-10-CM | POA: Diagnosis not present

## 2019-10-10 DIAGNOSIS — M545 Low back pain: Secondary | ICD-10-CM | POA: Diagnosis not present

## 2019-10-10 DIAGNOSIS — E1165 Type 2 diabetes mellitus with hyperglycemia: Secondary | ICD-10-CM | POA: Diagnosis not present

## 2019-10-10 DIAGNOSIS — I700 Atherosclerosis of aorta: Secondary | ICD-10-CM | POA: Diagnosis not present

## 2019-10-10 DIAGNOSIS — Z299 Encounter for prophylactic measures, unspecified: Secondary | ICD-10-CM | POA: Diagnosis not present

## 2019-10-10 DIAGNOSIS — I10 Essential (primary) hypertension: Secondary | ICD-10-CM | POA: Diagnosis not present

## 2019-10-21 DIAGNOSIS — I10 Essential (primary) hypertension: Secondary | ICD-10-CM | POA: Diagnosis not present

## 2019-10-21 DIAGNOSIS — E7800 Pure hypercholesterolemia, unspecified: Secondary | ICD-10-CM | POA: Diagnosis not present

## 2019-10-21 DIAGNOSIS — E119 Type 2 diabetes mellitus without complications: Secondary | ICD-10-CM | POA: Diagnosis not present

## 2019-10-30 DIAGNOSIS — H35033 Hypertensive retinopathy, bilateral: Secondary | ICD-10-CM | POA: Diagnosis not present

## 2019-11-02 DIAGNOSIS — H35033 Hypertensive retinopathy, bilateral: Secondary | ICD-10-CM | POA: Diagnosis not present

## 2019-11-02 DIAGNOSIS — H5711 Ocular pain, right eye: Secondary | ICD-10-CM | POA: Diagnosis not present

## 2019-11-02 DIAGNOSIS — H2000 Unspecified acute and subacute iridocyclitis: Secondary | ICD-10-CM | POA: Diagnosis not present

## 2019-11-02 DIAGNOSIS — H532 Diplopia: Secondary | ICD-10-CM | POA: Diagnosis not present

## 2019-11-08 ENCOUNTER — Other Ambulatory Visit (INDEPENDENT_AMBULATORY_CARE_PROVIDER_SITE_OTHER): Payer: Self-pay | Admitting: Gastroenterology

## 2019-11-08 MED ORDER — DICYCLOMINE HCL 20 MG PO TABS: 20.0000 mg | ORAL_TABLET | Freq: Every day | ORAL | 0 refills | Status: DC

## 2019-11-13 DIAGNOSIS — H5711 Ocular pain, right eye: Secondary | ICD-10-CM | POA: Diagnosis not present

## 2019-11-13 DIAGNOSIS — H2000 Unspecified acute and subacute iridocyclitis: Secondary | ICD-10-CM | POA: Diagnosis not present

## 2019-11-21 DIAGNOSIS — I10 Essential (primary) hypertension: Secondary | ICD-10-CM | POA: Diagnosis not present

## 2019-11-21 DIAGNOSIS — M25561 Pain in right knee: Secondary | ICD-10-CM | POA: Insufficient documentation

## 2019-11-21 DIAGNOSIS — E119 Type 2 diabetes mellitus without complications: Secondary | ICD-10-CM | POA: Diagnosis not present

## 2019-11-21 DIAGNOSIS — E7800 Pure hypercholesterolemia, unspecified: Secondary | ICD-10-CM | POA: Diagnosis not present

## 2019-11-22 DIAGNOSIS — M25561 Pain in right knee: Secondary | ICD-10-CM | POA: Diagnosis not present

## 2019-11-22 DIAGNOSIS — S8001XA Contusion of right knee, initial encounter: Secondary | ICD-10-CM | POA: Diagnosis not present

## 2019-11-27 ENCOUNTER — Ambulatory Visit (INDEPENDENT_AMBULATORY_CARE_PROVIDER_SITE_OTHER): Payer: 59 | Admitting: Internal Medicine

## 2019-12-21 DIAGNOSIS — E119 Type 2 diabetes mellitus without complications: Secondary | ICD-10-CM | POA: Diagnosis not present

## 2019-12-21 DIAGNOSIS — I10 Essential (primary) hypertension: Secondary | ICD-10-CM | POA: Diagnosis not present

## 2019-12-21 DIAGNOSIS — E7800 Pure hypercholesterolemia, unspecified: Secondary | ICD-10-CM | POA: Diagnosis not present

## 2020-01-03 DIAGNOSIS — E119 Type 2 diabetes mellitus without complications: Secondary | ICD-10-CM | POA: Diagnosis not present

## 2020-01-03 DIAGNOSIS — Z961 Presence of intraocular lens: Secondary | ICD-10-CM | POA: Diagnosis not present

## 2020-01-09 DIAGNOSIS — E119 Type 2 diabetes mellitus without complications: Secondary | ICD-10-CM | POA: Diagnosis not present

## 2020-01-09 DIAGNOSIS — E7800 Pure hypercholesterolemia, unspecified: Secondary | ICD-10-CM | POA: Diagnosis not present

## 2020-01-09 DIAGNOSIS — I10 Essential (primary) hypertension: Secondary | ICD-10-CM | POA: Diagnosis not present

## 2020-01-15 DIAGNOSIS — I700 Atherosclerosis of aorta: Secondary | ICD-10-CM | POA: Diagnosis not present

## 2020-01-15 DIAGNOSIS — E1165 Type 2 diabetes mellitus with hyperglycemia: Secondary | ICD-10-CM | POA: Diagnosis not present

## 2020-01-15 DIAGNOSIS — Z299 Encounter for prophylactic measures, unspecified: Secondary | ICD-10-CM | POA: Diagnosis not present

## 2020-01-15 DIAGNOSIS — N183 Chronic kidney disease, stage 3 unspecified: Secondary | ICD-10-CM | POA: Diagnosis not present

## 2020-01-15 DIAGNOSIS — I10 Essential (primary) hypertension: Secondary | ICD-10-CM | POA: Diagnosis not present

## 2020-01-31 DIAGNOSIS — H524 Presbyopia: Secondary | ICD-10-CM | POA: Diagnosis not present

## 2020-01-31 DIAGNOSIS — H35033 Hypertensive retinopathy, bilateral: Secondary | ICD-10-CM | POA: Diagnosis not present

## 2020-02-05 DIAGNOSIS — Z1231 Encounter for screening mammogram for malignant neoplasm of breast: Secondary | ICD-10-CM | POA: Diagnosis not present

## 2020-02-13 DIAGNOSIS — Z299 Encounter for prophylactic measures, unspecified: Secondary | ICD-10-CM | POA: Diagnosis not present

## 2020-02-13 DIAGNOSIS — M549 Dorsalgia, unspecified: Secondary | ICD-10-CM | POA: Diagnosis not present

## 2020-02-13 DIAGNOSIS — E1165 Type 2 diabetes mellitus with hyperglycemia: Secondary | ICD-10-CM | POA: Diagnosis not present

## 2020-02-13 DIAGNOSIS — I10 Essential (primary) hypertension: Secondary | ICD-10-CM | POA: Diagnosis not present

## 2020-02-19 DIAGNOSIS — G629 Polyneuropathy, unspecified: Secondary | ICD-10-CM | POA: Diagnosis not present

## 2020-02-19 DIAGNOSIS — M1710 Unilateral primary osteoarthritis, unspecified knee: Secondary | ICD-10-CM | POA: Diagnosis not present

## 2020-02-19 DIAGNOSIS — E1165 Type 2 diabetes mellitus with hyperglycemia: Secondary | ICD-10-CM | POA: Diagnosis not present

## 2020-02-19 DIAGNOSIS — Z299 Encounter for prophylactic measures, unspecified: Secondary | ICD-10-CM | POA: Diagnosis not present

## 2020-02-19 DIAGNOSIS — I10 Essential (primary) hypertension: Secondary | ICD-10-CM | POA: Diagnosis not present

## 2020-02-21 DIAGNOSIS — I10 Essential (primary) hypertension: Secondary | ICD-10-CM | POA: Diagnosis not present

## 2020-02-21 DIAGNOSIS — E7800 Pure hypercholesterolemia, unspecified: Secondary | ICD-10-CM | POA: Diagnosis not present

## 2020-02-21 DIAGNOSIS — E119 Type 2 diabetes mellitus without complications: Secondary | ICD-10-CM | POA: Diagnosis not present

## 2020-03-04 DIAGNOSIS — I10 Essential (primary) hypertension: Secondary | ICD-10-CM | POA: Diagnosis not present

## 2020-03-04 DIAGNOSIS — I700 Atherosclerosis of aorta: Secondary | ICD-10-CM | POA: Diagnosis not present

## 2020-03-04 DIAGNOSIS — Z299 Encounter for prophylactic measures, unspecified: Secondary | ICD-10-CM | POA: Diagnosis not present

## 2020-03-04 DIAGNOSIS — E1165 Type 2 diabetes mellitus with hyperglycemia: Secondary | ICD-10-CM | POA: Diagnosis not present

## 2020-03-21 DIAGNOSIS — E119 Type 2 diabetes mellitus without complications: Secondary | ICD-10-CM | POA: Diagnosis not present

## 2020-03-21 DIAGNOSIS — I10 Essential (primary) hypertension: Secondary | ICD-10-CM | POA: Diagnosis not present

## 2020-03-21 DIAGNOSIS — E7800 Pure hypercholesterolemia, unspecified: Secondary | ICD-10-CM | POA: Diagnosis not present

## 2020-04-02 DIAGNOSIS — Z299 Encounter for prophylactic measures, unspecified: Secondary | ICD-10-CM | POA: Diagnosis not present

## 2020-04-02 DIAGNOSIS — I10 Essential (primary) hypertension: Secondary | ICD-10-CM | POA: Diagnosis not present

## 2020-04-02 DIAGNOSIS — E1165 Type 2 diabetes mellitus with hyperglycemia: Secondary | ICD-10-CM | POA: Diagnosis not present

## 2020-04-02 DIAGNOSIS — I700 Atherosclerosis of aorta: Secondary | ICD-10-CM | POA: Diagnosis not present

## 2020-04-22 DIAGNOSIS — I10 Essential (primary) hypertension: Secondary | ICD-10-CM | POA: Diagnosis not present

## 2020-04-22 DIAGNOSIS — E119 Type 2 diabetes mellitus without complications: Secondary | ICD-10-CM | POA: Diagnosis not present

## 2020-04-22 DIAGNOSIS — E7800 Pure hypercholesterolemia, unspecified: Secondary | ICD-10-CM | POA: Diagnosis not present

## 2020-05-22 DIAGNOSIS — I10 Essential (primary) hypertension: Secondary | ICD-10-CM | POA: Diagnosis not present

## 2020-05-22 DIAGNOSIS — E7800 Pure hypercholesterolemia, unspecified: Secondary | ICD-10-CM | POA: Diagnosis not present

## 2020-05-22 DIAGNOSIS — E119 Type 2 diabetes mellitus without complications: Secondary | ICD-10-CM | POA: Diagnosis not present

## 2020-06-18 DIAGNOSIS — Z299 Encounter for prophylactic measures, unspecified: Secondary | ICD-10-CM | POA: Diagnosis not present

## 2020-06-18 DIAGNOSIS — I10 Essential (primary) hypertension: Secondary | ICD-10-CM | POA: Diagnosis not present

## 2020-06-18 DIAGNOSIS — R350 Frequency of micturition: Secondary | ICD-10-CM | POA: Diagnosis not present

## 2020-06-18 DIAGNOSIS — N390 Urinary tract infection, site not specified: Secondary | ICD-10-CM | POA: Diagnosis not present

## 2020-06-18 DIAGNOSIS — E1165 Type 2 diabetes mellitus with hyperglycemia: Secondary | ICD-10-CM | POA: Diagnosis not present

## 2020-06-23 DIAGNOSIS — I10 Essential (primary) hypertension: Secondary | ICD-10-CM | POA: Diagnosis not present

## 2020-06-23 DIAGNOSIS — E039 Hypothyroidism, unspecified: Secondary | ICD-10-CM | POA: Diagnosis not present

## 2020-06-23 DIAGNOSIS — Z87891 Personal history of nicotine dependence: Secondary | ICD-10-CM | POA: Diagnosis not present

## 2020-06-23 DIAGNOSIS — Z299 Encounter for prophylactic measures, unspecified: Secondary | ICD-10-CM | POA: Diagnosis not present

## 2020-06-23 DIAGNOSIS — I700 Atherosclerosis of aorta: Secondary | ICD-10-CM | POA: Diagnosis not present

## 2020-06-23 DIAGNOSIS — K219 Gastro-esophageal reflux disease without esophagitis: Secondary | ICD-10-CM | POA: Diagnosis not present

## 2020-06-23 DIAGNOSIS — E1165 Type 2 diabetes mellitus with hyperglycemia: Secondary | ICD-10-CM | POA: Diagnosis not present

## 2020-07-16 DIAGNOSIS — E1165 Type 2 diabetes mellitus with hyperglycemia: Secondary | ICD-10-CM | POA: Diagnosis not present

## 2020-07-16 DIAGNOSIS — Z299 Encounter for prophylactic measures, unspecified: Secondary | ICD-10-CM | POA: Diagnosis not present

## 2020-07-16 DIAGNOSIS — I700 Atherosclerosis of aorta: Secondary | ICD-10-CM | POA: Diagnosis not present

## 2020-07-16 DIAGNOSIS — N1832 Chronic kidney disease, stage 3b: Secondary | ICD-10-CM | POA: Diagnosis not present

## 2020-07-16 DIAGNOSIS — I10 Essential (primary) hypertension: Secondary | ICD-10-CM | POA: Diagnosis not present

## 2020-08-20 DIAGNOSIS — E039 Hypothyroidism, unspecified: Secondary | ICD-10-CM | POA: Diagnosis not present

## 2020-08-20 DIAGNOSIS — K219 Gastro-esophageal reflux disease without esophagitis: Secondary | ICD-10-CM | POA: Diagnosis not present

## 2020-08-22 DIAGNOSIS — E039 Hypothyroidism, unspecified: Secondary | ICD-10-CM | POA: Diagnosis not present

## 2020-08-22 DIAGNOSIS — Z299 Encounter for prophylactic measures, unspecified: Secondary | ICD-10-CM | POA: Diagnosis not present

## 2020-08-22 DIAGNOSIS — E1165 Type 2 diabetes mellitus with hyperglycemia: Secondary | ICD-10-CM | POA: Diagnosis not present

## 2020-08-22 DIAGNOSIS — I700 Atherosclerosis of aorta: Secondary | ICD-10-CM | POA: Diagnosis not present

## 2020-08-22 DIAGNOSIS — I10 Essential (primary) hypertension: Secondary | ICD-10-CM | POA: Diagnosis not present

## 2020-09-12 DIAGNOSIS — E039 Hypothyroidism, unspecified: Secondary | ICD-10-CM | POA: Diagnosis not present

## 2020-09-12 DIAGNOSIS — Z7189 Other specified counseling: Secondary | ICD-10-CM | POA: Diagnosis not present

## 2020-09-12 DIAGNOSIS — Z299 Encounter for prophylactic measures, unspecified: Secondary | ICD-10-CM | POA: Diagnosis not present

## 2020-09-12 DIAGNOSIS — R5383 Other fatigue: Secondary | ICD-10-CM | POA: Diagnosis not present

## 2020-09-12 DIAGNOSIS — Z0000 Encounter for general adult medical examination without abnormal findings: Secondary | ICD-10-CM | POA: Diagnosis not present

## 2020-09-12 DIAGNOSIS — I10 Essential (primary) hypertension: Secondary | ICD-10-CM | POA: Diagnosis not present

## 2020-09-12 DIAGNOSIS — Z79899 Other long term (current) drug therapy: Secondary | ICD-10-CM | POA: Diagnosis not present

## 2020-09-12 DIAGNOSIS — E7800 Pure hypercholesterolemia, unspecified: Secondary | ICD-10-CM | POA: Diagnosis not present

## 2020-09-12 DIAGNOSIS — E538 Deficiency of other specified B group vitamins: Secondary | ICD-10-CM | POA: Diagnosis not present

## 2020-09-20 DIAGNOSIS — K219 Gastro-esophageal reflux disease without esophagitis: Secondary | ICD-10-CM | POA: Diagnosis not present

## 2020-09-20 DIAGNOSIS — E1165 Type 2 diabetes mellitus with hyperglycemia: Secondary | ICD-10-CM | POA: Diagnosis not present

## 2020-09-20 DIAGNOSIS — E039 Hypothyroidism, unspecified: Secondary | ICD-10-CM | POA: Diagnosis not present

## 2020-10-16 DIAGNOSIS — Z299 Encounter for prophylactic measures, unspecified: Secondary | ICD-10-CM | POA: Diagnosis not present

## 2020-10-16 DIAGNOSIS — E1165 Type 2 diabetes mellitus with hyperglycemia: Secondary | ICD-10-CM | POA: Diagnosis not present

## 2020-10-16 DIAGNOSIS — I10 Essential (primary) hypertension: Secondary | ICD-10-CM | POA: Diagnosis not present

## 2020-10-16 DIAGNOSIS — D692 Other nonthrombocytopenic purpura: Secondary | ICD-10-CM | POA: Diagnosis not present

## 2020-11-20 DIAGNOSIS — K219 Gastro-esophageal reflux disease without esophagitis: Secondary | ICD-10-CM | POA: Diagnosis not present

## 2020-11-20 DIAGNOSIS — E039 Hypothyroidism, unspecified: Secondary | ICD-10-CM | POA: Diagnosis not present

## 2020-11-20 DIAGNOSIS — E1165 Type 2 diabetes mellitus with hyperglycemia: Secondary | ICD-10-CM | POA: Diagnosis not present

## 2020-12-01 DIAGNOSIS — E1165 Type 2 diabetes mellitus with hyperglycemia: Secondary | ICD-10-CM | POA: Diagnosis not present

## 2020-12-01 DIAGNOSIS — I10 Essential (primary) hypertension: Secondary | ICD-10-CM | POA: Diagnosis not present

## 2020-12-01 DIAGNOSIS — M79671 Pain in right foot: Secondary | ICD-10-CM | POA: Diagnosis not present

## 2020-12-01 DIAGNOSIS — Z299 Encounter for prophylactic measures, unspecified: Secondary | ICD-10-CM | POA: Diagnosis not present

## 2020-12-01 DIAGNOSIS — M25571 Pain in right ankle and joints of right foot: Secondary | ICD-10-CM | POA: Diagnosis not present

## 2020-12-31 DIAGNOSIS — Z299 Encounter for prophylactic measures, unspecified: Secondary | ICD-10-CM | POA: Diagnosis not present

## 2020-12-31 DIAGNOSIS — M1990 Unspecified osteoarthritis, unspecified site: Secondary | ICD-10-CM | POA: Diagnosis not present

## 2020-12-31 DIAGNOSIS — I10 Essential (primary) hypertension: Secondary | ICD-10-CM | POA: Diagnosis not present

## 2021-01-06 DIAGNOSIS — Z299 Encounter for prophylactic measures, unspecified: Secondary | ICD-10-CM | POA: Diagnosis not present

## 2021-01-06 DIAGNOSIS — I10 Essential (primary) hypertension: Secondary | ICD-10-CM | POA: Diagnosis not present

## 2021-01-06 DIAGNOSIS — J449 Chronic obstructive pulmonary disease, unspecified: Secondary | ICD-10-CM | POA: Diagnosis not present

## 2021-01-06 DIAGNOSIS — R232 Flushing: Secondary | ICD-10-CM | POA: Diagnosis not present

## 2021-01-06 DIAGNOSIS — Z789 Other specified health status: Secondary | ICD-10-CM | POA: Diagnosis not present

## 2021-01-13 DIAGNOSIS — Z299 Encounter for prophylactic measures, unspecified: Secondary | ICD-10-CM | POA: Diagnosis not present

## 2021-01-13 DIAGNOSIS — I10 Essential (primary) hypertension: Secondary | ICD-10-CM | POA: Diagnosis not present

## 2021-01-13 DIAGNOSIS — J069 Acute upper respiratory infection, unspecified: Secondary | ICD-10-CM | POA: Diagnosis not present

## 2021-01-21 DIAGNOSIS — E1165 Type 2 diabetes mellitus with hyperglycemia: Secondary | ICD-10-CM | POA: Diagnosis not present

## 2021-01-21 DIAGNOSIS — I10 Essential (primary) hypertension: Secondary | ICD-10-CM | POA: Diagnosis not present

## 2021-01-21 DIAGNOSIS — E7800 Pure hypercholesterolemia, unspecified: Secondary | ICD-10-CM | POA: Diagnosis not present

## 2021-01-21 DIAGNOSIS — E039 Hypothyroidism, unspecified: Secondary | ICD-10-CM | POA: Diagnosis not present

## 2021-01-21 DIAGNOSIS — Z299 Encounter for prophylactic measures, unspecified: Secondary | ICD-10-CM | POA: Diagnosis not present

## 2021-02-24 DIAGNOSIS — R42 Dizziness and giddiness: Secondary | ICD-10-CM | POA: Diagnosis not present

## 2021-02-24 DIAGNOSIS — G629 Polyneuropathy, unspecified: Secondary | ICD-10-CM | POA: Diagnosis not present

## 2021-02-24 DIAGNOSIS — K259 Gastric ulcer, unspecified as acute or chronic, without hemorrhage or perforation: Secondary | ICD-10-CM | POA: Diagnosis not present

## 2021-02-24 DIAGNOSIS — Z299 Encounter for prophylactic measures, unspecified: Secondary | ICD-10-CM | POA: Diagnosis not present

## 2021-02-24 DIAGNOSIS — E1165 Type 2 diabetes mellitus with hyperglycemia: Secondary | ICD-10-CM | POA: Diagnosis not present

## 2021-03-11 DIAGNOSIS — L814 Other melanin hyperpigmentation: Secondary | ICD-10-CM | POA: Diagnosis not present

## 2021-03-11 DIAGNOSIS — C44629 Squamous cell carcinoma of skin of left upper limb, including shoulder: Secondary | ICD-10-CM | POA: Diagnosis not present

## 2021-03-11 DIAGNOSIS — D485 Neoplasm of uncertain behavior of skin: Secondary | ICD-10-CM | POA: Diagnosis not present

## 2021-03-11 DIAGNOSIS — L821 Other seborrheic keratosis: Secondary | ICD-10-CM | POA: Diagnosis not present

## 2021-03-11 DIAGNOSIS — L570 Actinic keratosis: Secondary | ICD-10-CM | POA: Diagnosis not present

## 2021-03-11 DIAGNOSIS — D229 Melanocytic nevi, unspecified: Secondary | ICD-10-CM | POA: Diagnosis not present

## 2021-03-16 DIAGNOSIS — I700 Atherosclerosis of aorta: Secondary | ICD-10-CM | POA: Diagnosis not present

## 2021-03-16 DIAGNOSIS — M545 Low back pain, unspecified: Secondary | ICD-10-CM | POA: Diagnosis not present

## 2021-03-16 DIAGNOSIS — R339 Retention of urine, unspecified: Secondary | ICD-10-CM | POA: Diagnosis not present

## 2021-03-16 DIAGNOSIS — Z299 Encounter for prophylactic measures, unspecified: Secondary | ICD-10-CM | POA: Diagnosis not present

## 2021-03-16 DIAGNOSIS — I10 Essential (primary) hypertension: Secondary | ICD-10-CM | POA: Diagnosis not present

## 2021-03-23 DIAGNOSIS — K219 Gastro-esophageal reflux disease without esophagitis: Secondary | ICD-10-CM | POA: Diagnosis not present

## 2021-03-23 DIAGNOSIS — E1165 Type 2 diabetes mellitus with hyperglycemia: Secondary | ICD-10-CM | POA: Diagnosis not present

## 2021-03-23 DIAGNOSIS — E039 Hypothyroidism, unspecified: Secondary | ICD-10-CM | POA: Diagnosis not present

## 2021-04-14 DIAGNOSIS — J329 Chronic sinusitis, unspecified: Secondary | ICD-10-CM | POA: Diagnosis not present

## 2021-04-14 DIAGNOSIS — Z299 Encounter for prophylactic measures, unspecified: Secondary | ICD-10-CM | POA: Diagnosis not present

## 2021-04-14 DIAGNOSIS — I10 Essential (primary) hypertension: Secondary | ICD-10-CM | POA: Diagnosis not present

## 2021-04-14 DIAGNOSIS — E1165 Type 2 diabetes mellitus with hyperglycemia: Secondary | ICD-10-CM | POA: Diagnosis not present

## 2021-04-14 DIAGNOSIS — Z87891 Personal history of nicotine dependence: Secondary | ICD-10-CM | POA: Diagnosis not present

## 2021-04-22 DIAGNOSIS — E7800 Pure hypercholesterolemia, unspecified: Secondary | ICD-10-CM | POA: Diagnosis not present

## 2021-04-22 DIAGNOSIS — I10 Essential (primary) hypertension: Secondary | ICD-10-CM | POA: Diagnosis not present

## 2021-04-27 DIAGNOSIS — D0462 Carcinoma in situ of skin of left upper limb, including shoulder: Secondary | ICD-10-CM | POA: Diagnosis not present

## 2021-04-27 DIAGNOSIS — L905 Scar conditions and fibrosis of skin: Secondary | ICD-10-CM | POA: Diagnosis not present

## 2021-05-22 DIAGNOSIS — E7800 Pure hypercholesterolemia, unspecified: Secondary | ICD-10-CM | POA: Diagnosis not present

## 2021-07-30 ENCOUNTER — Institutional Professional Consult (permissible substitution): Payer: 59 | Admitting: Neurology

## 2021-12-25 ENCOUNTER — Inpatient Hospital Stay (HOSPITAL_COMMUNITY)
Admission: EM | Admit: 2021-12-25 | Discharge: 2021-12-30 | DRG: 353 | Disposition: A | Discharge status: Home Health | Payer: PPO | Attending: Internal Medicine | Admitting: Internal Medicine

## 2021-12-25 ENCOUNTER — Emergency Department (HOSPITAL_COMMUNITY): Payer: PPO

## 2021-12-25 ENCOUNTER — Encounter (HOSPITAL_COMMUNITY): Payer: Self-pay | Admitting: Emergency Medicine

## 2021-12-25 ENCOUNTER — Encounter (HOSPITAL_COMMUNITY)
Admission: EM | Disposition: A | Discharge status: Home Health | Payer: Self-pay | Source: Home / Self Care | Attending: Family Medicine

## 2021-12-25 ENCOUNTER — Inpatient Hospital Stay (HOSPITAL_COMMUNITY): Payer: PPO | Admitting: Anesthesiology

## 2021-12-25 ENCOUNTER — Other Ambulatory Visit: Payer: Self-pay

## 2021-12-25 DIAGNOSIS — K430 Incisional hernia with obstruction, without gangrene: Principal | ICD-10-CM | POA: Diagnosis present

## 2021-12-25 DIAGNOSIS — R29702 NIHSS score 2: Secondary | ICD-10-CM | POA: Diagnosis not present

## 2021-12-25 DIAGNOSIS — F40240 Claustrophobia: Secondary | ICD-10-CM | POA: Diagnosis present

## 2021-12-25 DIAGNOSIS — R109 Unspecified abdominal pain: Secondary | ICD-10-CM | POA: Diagnosis not present

## 2021-12-25 DIAGNOSIS — I6389 Other cerebral infarction: Secondary | ICD-10-CM | POA: Diagnosis not present

## 2021-12-25 DIAGNOSIS — R1030 Lower abdominal pain, unspecified: Principal | ICD-10-CM

## 2021-12-25 DIAGNOSIS — N179 Acute kidney failure, unspecified: Secondary | ICD-10-CM | POA: Diagnosis present

## 2021-12-25 DIAGNOSIS — I639 Cerebral infarction, unspecified: Secondary | ICD-10-CM | POA: Clinically undetermined

## 2021-12-25 DIAGNOSIS — I447 Left bundle-branch block, unspecified: Secondary | ICD-10-CM | POA: Diagnosis present

## 2021-12-25 DIAGNOSIS — D75839 Thrombocytosis, unspecified: Secondary | ICD-10-CM | POA: Diagnosis present

## 2021-12-25 DIAGNOSIS — E1140 Type 2 diabetes mellitus with diabetic neuropathy, unspecified: Secondary | ICD-10-CM | POA: Diagnosis present

## 2021-12-25 DIAGNOSIS — Z79899 Other long term (current) drug therapy: Secondary | ICD-10-CM | POA: Diagnosis not present

## 2021-12-25 DIAGNOSIS — E1122 Type 2 diabetes mellitus with diabetic chronic kidney disease: Secondary | ICD-10-CM | POA: Diagnosis present

## 2021-12-25 DIAGNOSIS — E785 Hyperlipidemia, unspecified: Secondary | ICD-10-CM | POA: Diagnosis present

## 2021-12-25 DIAGNOSIS — I10 Essential (primary) hypertension: Secondary | ICD-10-CM | POA: Diagnosis present

## 2021-12-25 DIAGNOSIS — K219 Gastro-esophageal reflux disease without esophagitis: Secondary | ICD-10-CM | POA: Diagnosis present

## 2021-12-25 DIAGNOSIS — E119 Type 2 diabetes mellitus without complications: Secondary | ICD-10-CM

## 2021-12-25 DIAGNOSIS — Z8673 Personal history of transient ischemic attack (TIA), and cerebral infarction without residual deficits: Secondary | ICD-10-CM

## 2021-12-25 DIAGNOSIS — Z90710 Acquired absence of both cervix and uterus: Secondary | ICD-10-CM

## 2021-12-25 DIAGNOSIS — G250 Essential tremor: Secondary | ICD-10-CM

## 2021-12-25 DIAGNOSIS — K439 Ventral hernia without obstruction or gangrene: Secondary | ICD-10-CM | POA: Diagnosis present

## 2021-12-25 DIAGNOSIS — Z7984 Long term (current) use of oral hypoglycemic drugs: Secondary | ICD-10-CM

## 2021-12-25 DIAGNOSIS — Z96653 Presence of artificial knee joint, bilateral: Secondary | ICD-10-CM | POA: Diagnosis present

## 2021-12-25 DIAGNOSIS — Z79890 Hormone replacement therapy (postmenopausal): Secondary | ICD-10-CM | POA: Diagnosis not present

## 2021-12-25 DIAGNOSIS — Z87891 Personal history of nicotine dependence: Secondary | ICD-10-CM

## 2021-12-25 DIAGNOSIS — I129 Hypertensive chronic kidney disease with stage 1 through stage 4 chronic kidney disease, or unspecified chronic kidney disease: Secondary | ICD-10-CM | POA: Diagnosis present

## 2021-12-25 DIAGNOSIS — Z806 Family history of leukemia: Secondary | ICD-10-CM | POA: Diagnosis not present

## 2021-12-25 DIAGNOSIS — E039 Hypothyroidism, unspecified: Secondary | ICD-10-CM | POA: Diagnosis present

## 2021-12-25 DIAGNOSIS — F419 Anxiety disorder, unspecified: Secondary | ICD-10-CM | POA: Diagnosis present

## 2021-12-25 DIAGNOSIS — K460 Unspecified abdominal hernia with obstruction, without gangrene: Secondary | ICD-10-CM | POA: Diagnosis not present

## 2021-12-25 DIAGNOSIS — D751 Secondary polycythemia: Secondary | ICD-10-CM | POA: Diagnosis present

## 2021-12-25 DIAGNOSIS — K589 Irritable bowel syndrome without diarrhea: Secondary | ICD-10-CM | POA: Diagnosis present

## 2021-12-25 DIAGNOSIS — E860 Dehydration: Secondary | ICD-10-CM | POA: Diagnosis present

## 2021-12-25 DIAGNOSIS — R251 Tremor, unspecified: Secondary | ICD-10-CM | POA: Diagnosis present

## 2021-12-25 DIAGNOSIS — N1832 Chronic kidney disease, stage 3b: Secondary | ICD-10-CM | POA: Diagnosis present

## 2021-12-25 DIAGNOSIS — Z885 Allergy status to narcotic agent status: Secondary | ICD-10-CM

## 2021-12-25 DIAGNOSIS — I63511 Cerebral infarction due to unspecified occlusion or stenosis of right middle cerebral artery: Secondary | ICD-10-CM | POA: Diagnosis not present

## 2021-12-25 DIAGNOSIS — Z888 Allergy status to other drugs, medicaments and biological substances status: Secondary | ICD-10-CM

## 2021-12-25 DIAGNOSIS — Z9049 Acquired absence of other specified parts of digestive tract: Secondary | ICD-10-CM

## 2021-12-25 HISTORY — PX: INCISIONAL HERNIA REPAIR: SHX193

## 2021-12-25 LAB — COMPREHENSIVE METABOLIC PANEL
ALT: 12 U/L (ref 0–44)
AST: 19 U/L (ref 15–41)
Albumin: 4.1 g/dL (ref 3.5–5.0)
Alkaline Phosphatase: 77 U/L (ref 38–126)
Anion gap: 9 (ref 5–15)
BUN: 33 mg/dL — ABNORMAL HIGH (ref 8–23)
CO2: 24 mmol/L (ref 22–32)
Calcium: 8.6 mg/dL — ABNORMAL LOW (ref 8.9–10.3)
Chloride: 105 mmol/L (ref 98–111)
Creatinine, Ser: 1.62 mg/dL — ABNORMAL HIGH (ref 0.44–1.00)
GFR, Estimated: 32 mL/min — ABNORMAL LOW (ref >60)
Glucose, Bld: 310 mg/dL — ABNORMAL HIGH (ref 70–99)
Potassium: 3.8 mmol/L (ref 3.5–5.1)
Sodium: 138 mmol/L (ref 135–145)
Total Bilirubin: 0.5 mg/dL (ref 0.3–1.2)
Total Protein: 7.7 g/dL (ref 6.5–8.1)

## 2021-12-25 LAB — URINALYSIS, ROUTINE W REFLEX MICROSCOPIC
Bacteria, UA: NONE SEEN
Bilirubin Urine: NEGATIVE
Glucose, UA: >=500 mg/dL — AB
Ketones, ur: NEGATIVE mg/dL
Leukocytes,Ua: NEGATIVE
Nitrite: NEGATIVE
Protein, ur: 100 mg/dL — AB
Specific Gravity, Urine: 1.014 (ref 1.005–1.030)
pH: 5 (ref 5.0–8.0)

## 2021-12-25 LAB — LIPASE, BLOOD: Lipase: 47 U/L (ref 11–51)

## 2021-12-25 LAB — HEMOGLOBIN A1C
Hgb A1c MFr Bld: 8 % — ABNORMAL HIGH (ref 4.8–5.6)
Mean Plasma Glucose: 182.9 mg/dL

## 2021-12-25 LAB — CBC
HCT: 53.7 % — ABNORMAL HIGH (ref 36.0–46.0)
Hemoglobin: 17.1 g/dL — ABNORMAL HIGH (ref 12.0–15.0)
MCH: 27.1 pg (ref 26.0–34.0)
MCHC: 31.8 g/dL (ref 30.0–36.0)
MCV: 85 fL (ref 80.0–100.0)
Platelets: 428 10*3/uL — ABNORMAL HIGH (ref 150–400)
RBC: 6.32 MIL/uL — ABNORMAL HIGH (ref 3.87–5.11)
RDW: 20.6 % — ABNORMAL HIGH (ref 11.5–15.5)
WBC: 11.8 10*3/uL — ABNORMAL HIGH (ref 4.0–10.5)
nRBC: 0 % (ref 0.0–0.2)

## 2021-12-25 LAB — GLUCOSE, CAPILLARY
Glucose-Capillary: 178 mg/dL — ABNORMAL HIGH (ref 70–99)
Glucose-Capillary: 192 mg/dL — ABNORMAL HIGH (ref 70–99)
Glucose-Capillary: 159 mg/dL — ABNORMAL HIGH (ref 70–99)

## 2021-12-25 LAB — TROPONIN I (HIGH SENSITIVITY): Troponin I (High Sensitivity): 6 ng/L (ref <18)

## 2021-12-25 SURGERY — REPAIR, HERNIA, INCISIONAL
Anesthesia: General

## 2021-12-25 MED ORDER — ONDANSETRON HCL 4 MG/2ML IJ SOLN
4.0000 mg | Freq: Four times a day (QID) | INTRAMUSCULAR | Status: DC | PRN
  Administered 2021-12-25 – 2021-12-26 (×2): 4 mg via INTRAVENOUS
  Filled 2021-12-25 (×2): qty 2

## 2021-12-25 MED ORDER — HYDRALAZINE HCL 20 MG/ML IJ SOLN
10.0000 mg | INTRAMUSCULAR | Status: DC | PRN
  Administered 2021-12-25 – 2021-12-26 (×2): 10 mg via INTRAVENOUS
  Filled 2021-12-25 (×3): qty 1

## 2021-12-25 MED ORDER — INSULIN ASPART 100 UNIT/ML IJ SOLN
0.0000 [IU] | INTRAMUSCULAR | Status: DC
  Administered 2021-12-25: 2 [IU] via SUBCUTANEOUS
  Administered 2021-12-26: 1 [IU] via SUBCUTANEOUS
  Administered 2021-12-26: 2 [IU] via SUBCUTANEOUS

## 2021-12-25 MED ORDER — HEPARIN SODIUM (PORCINE) 5000 UNIT/ML IJ SOLN
5000.0000 [IU] | Freq: Three times a day (TID) | INTRAMUSCULAR | Status: DC
  Administered 2021-12-26 – 2021-12-30 (×14): 5000 [IU] via SUBCUTANEOUS
  Filled 2021-12-25 (×14): qty 1

## 2021-12-25 MED ORDER — IOHEXOL 300 MG/ML  SOLN
75.0000 mL | Freq: Once | INTRAMUSCULAR | Status: AC | PRN
  Administered 2021-12-25: 80 mL via INTRAVENOUS

## 2021-12-25 MED ORDER — FENTANYL CITRATE (PF) 100 MCG/2ML IJ SOLN
INTRAMUSCULAR | Status: DC | PRN
  Administered 2021-12-25 (×2): 50 ug via INTRAVENOUS

## 2021-12-25 MED ORDER — PROPOFOL 10 MG/ML IV BOLUS
INTRAVENOUS | Status: DC | PRN
  Administered 2021-12-25: 100 mg via INTRAVENOUS

## 2021-12-25 MED ORDER — HYDROMORPHONE HCL 1 MG/ML IJ SOLN: 0.5000 mg | INTRAMUSCULAR | Status: DC | PRN

## 2021-12-25 MED ORDER — PROMETHAZINE HCL 25 MG/ML IJ SOLN: 12.5000 mg | Freq: Once | INTRAMUSCULAR | Status: DC | PRN

## 2021-12-25 MED ORDER — LACTATED RINGERS IV SOLN
INTRAVENOUS | Status: DC
Start: 2021-12-25 — End: 2021-12-25

## 2021-12-25 MED ORDER — ONDANSETRON HCL 4 MG/2ML IJ SOLN
4.0000 mg | Freq: Once | INTRAMUSCULAR | Status: AC
  Administered 2021-12-25: 4 mg via INTRAVENOUS
  Filled 2021-12-25: qty 2

## 2021-12-25 MED ORDER — CLONAZEPAM 0.5 MG PO TABS
0.5000 mg | ORAL_TABLET | Freq: Two times a day (BID) | ORAL | Status: DC | PRN
  Administered 2021-12-25 – 2021-12-28 (×6): 0.5 mg via ORAL
  Filled 2021-12-25 (×6): qty 1

## 2021-12-25 MED ORDER — SODIUM CHLORIDE 0.9 % IR SOLN
Status: DC | PRN
  Administered 2021-12-25: 1000 mL

## 2021-12-25 MED ORDER — ONDANSETRON HCL 4 MG PO TABS
4.0000 mg | ORAL_TABLET | Freq: Four times a day (QID) | ORAL | Status: DC | PRN
  Administered 2021-12-27: 4 mg via ORAL
  Filled 2021-12-25 (×2): qty 1

## 2021-12-25 MED ORDER — SODIUM CHLORIDE 0.9 % IV BOLUS
500.0000 mL | Freq: Once | INTRAVENOUS | Status: AC
  Administered 2021-12-25: 500 mL via INTRAVENOUS

## 2021-12-25 MED ORDER — ACETAMINOPHEN 325 MG PO TABS
650.0000 mg | ORAL_TABLET | Freq: Four times a day (QID) | ORAL | Status: DC | PRN
Start: 2021-12-25 — End: 2021-12-26
  Administered 2021-12-26: 650 mg via ORAL
  Filled 2021-12-25: qty 2

## 2021-12-25 MED ORDER — SODIUM CHLORIDE 0.9 % IV SOLN: INTRAVENOUS | Status: DC

## 2021-12-25 MED ORDER — HYDRALAZINE HCL 20 MG/ML IJ SOLN
20.0000 mg | Freq: Once | INTRAMUSCULAR | Status: AC
  Administered 2021-12-25: 20 mg via INTRAVENOUS

## 2021-12-25 MED ORDER — FENTANYL CITRATE PF 50 MCG/ML IJ SOSY
25.0000 ug | PREFILLED_SYRINGE | INTRAMUSCULAR | Status: DC | PRN
  Administered 2021-12-25: 50 ug via INTRAVENOUS

## 2021-12-25 MED ORDER — BUPIVACAINE LIPOSOME 1.3 % IJ SUSP
INTRAMUSCULAR | Status: AC
  Filled 2021-12-25: qty 20

## 2021-12-25 MED ORDER — CITALOPRAM HYDROBROMIDE 20 MG PO TABS
10.0000 mg | ORAL_TABLET | Freq: Every day | ORAL | Status: DC
  Administered 2021-12-26 – 2021-12-30 (×5): 10 mg via ORAL
  Filled 2021-12-25 (×5): qty 1

## 2021-12-25 MED ORDER — ONDANSETRON HCL 4 MG/2ML IJ SOLN
INTRAMUSCULAR | Status: DC | PRN
  Administered 2021-12-25: 4 mg via INTRAVENOUS

## 2021-12-25 MED ORDER — SUGAMMADEX SODIUM 200 MG/2ML IV SOLN
INTRAVENOUS | Status: DC | PRN
  Administered 2021-12-25: 200 mg via INTRAVENOUS

## 2021-12-25 MED ORDER — LEVOTHYROXINE SODIUM 50 MCG PO TABS
50.0000 ug | ORAL_TABLET | Freq: Every day | ORAL | Status: DC
  Administered 2021-12-26 – 2021-12-30 (×5): 50 ug via ORAL
  Filled 2021-12-25: qty 1
  Filled 2021-12-25: qty 2
  Filled 2021-12-25 (×3): qty 1

## 2021-12-25 MED ORDER — TRAMADOL HCL 50 MG PO TABS
50.0000 mg | ORAL_TABLET | Freq: Four times a day (QID) | ORAL | Status: DC | PRN
  Administered 2021-12-25 – 2021-12-28 (×6): 50 mg via ORAL
  Filled 2021-12-25 (×6): qty 1

## 2021-12-25 MED ORDER — HYDRALAZINE HCL 20 MG/ML IJ SOLN
20.0000 mg | Freq: Once | INTRAMUSCULAR | Status: AC
  Administered 2021-12-25: 20 mg via INTRAVENOUS
  Filled 2021-12-25: qty 1

## 2021-12-25 MED ORDER — PROCHLORPERAZINE EDISYLATE 10 MG/2ML IJ SOLN: 10.0000 mg | Freq: Four times a day (QID) | INTRAMUSCULAR | Status: DC | PRN

## 2021-12-25 MED ORDER — FENTANYL CITRATE PF 50 MCG/ML IJ SOSY
PREFILLED_SYRINGE | INTRAMUSCULAR | Status: AC
  Filled 2021-12-25: qty 1

## 2021-12-25 MED ORDER — CEFAZOLIN SODIUM-DEXTROSE 2-4 GM/100ML-% IV SOLN
2.0000 g | Freq: Once | INTRAVENOUS | Status: AC
  Administered 2021-12-25: 2 g via INTRAVENOUS
  Filled 2021-12-25: qty 100

## 2021-12-25 MED ORDER — BUPIVACAINE HCL (PF) 0.5 % IJ SOLN
INTRAMUSCULAR | Status: AC
  Filled 2021-12-25: qty 30

## 2021-12-25 MED ORDER — SIMETHICONE 80 MG PO CHEW: 80.0000 mg | CHEWABLE_TABLET | Freq: Four times a day (QID) | ORAL | Status: DC | PRN

## 2021-12-25 MED ORDER — FENTANYL CITRATE (PF) 100 MCG/2ML IJ SOLN
INTRAMUSCULAR | Status: AC
  Filled 2021-12-25: qty 2

## 2021-12-25 MED ORDER — METOPROLOL TARTRATE 5 MG/5ML IV SOLN: 5.0000 mg | Freq: Four times a day (QID) | INTRAVENOUS | Status: DC

## 2021-12-25 MED ORDER — SUCCINYLCHOLINE CHLORIDE 200 MG/10ML IV SOSY
PREFILLED_SYRINGE | INTRAVENOUS | Status: AC
  Filled 2021-12-25: qty 10

## 2021-12-25 MED ORDER — SUCCINYLCHOLINE CHLORIDE 200 MG/10ML IV SOSY
PREFILLED_SYRINGE | INTRAVENOUS | Status: DC | PRN
  Administered 2021-12-25: 100 mg via INTRAVENOUS

## 2021-12-25 MED ORDER — ROCURONIUM BROMIDE 100 MG/10ML IV SOLN
INTRAVENOUS | Status: DC | PRN
  Administered 2021-12-25: 50 mg via INTRAVENOUS

## 2021-12-25 MED ORDER — HYDROMORPHONE HCL 1 MG/ML IJ SOLN
0.5000 mg | INTRAMUSCULAR | Status: DC | PRN
  Administered 2021-12-25: 0.5 mg via INTRAVENOUS
  Filled 2021-12-25: qty 0.5

## 2021-12-25 MED ORDER — ROCURONIUM BROMIDE 10 MG/ML (PF) SYRINGE
PREFILLED_SYRINGE | INTRAVENOUS | Status: AC
  Filled 2021-12-25: qty 10

## 2021-12-25 MED ORDER — ONDANSETRON HCL 4 MG/2ML IJ SOLN
INTRAMUSCULAR | Status: AC
  Filled 2021-12-25: qty 2

## 2021-12-25 MED ORDER — DILTIAZEM HCL ER COATED BEADS 180 MG PO CP24
180.0000 mg | ORAL_CAPSULE | Freq: Every day | ORAL | Status: DC
  Administered 2021-12-25 – 2021-12-27 (×3): 180 mg via ORAL
  Filled 2021-12-25 (×3): qty 1

## 2021-12-25 MED ORDER — METOPROLOL SUCCINATE ER 25 MG PO TB24
25.0000 mg | ORAL_TABLET | Freq: Every day | ORAL | Status: DC
Start: 2021-12-26 — End: 2021-12-26

## 2021-12-25 MED ORDER — BUPIVACAINE LIPOSOME 1.3 % IJ SUSP
INTRAMUSCULAR | Status: DC | PRN
  Administered 2021-12-25: 30 mL

## 2021-12-25 MED ORDER — PROPOFOL 10 MG/ML IV BOLUS
INTRAVENOUS | Status: AC
  Filled 2021-12-25: qty 20

## 2021-12-25 SURGICAL SUPPLY — 34 items
ADH SKN CLS APL DERMABOND .7 (GAUZE/BANDAGES/DRESSINGS) ×1
APL PRP STRL LF DISP 70% ISPRP (MISCELLANEOUS) ×1
CHLORAPREP W/TINT 26 (MISCELLANEOUS) ×1 IMPLANT
CLOTH BEACON ORANGE TIMEOUT ST (SAFETY) ×1 IMPLANT
COVER LIGHT HANDLE STERIS (MISCELLANEOUS) ×2 IMPLANT
DERMABOND ADVANCED (GAUZE/BANDAGES/DRESSINGS) ×1
DERMABOND ADVANCED .7 DNX12 (GAUZE/BANDAGES/DRESSINGS) IMPLANT
DRAPE WARM FLUID 44X44 (DRAPES) ×1 IMPLANT
ELECT REM PT RETURN 9FT ADLT (ELECTROSURGICAL) ×2
ELECTRODE REM PT RTRN 9FT ADLT (ELECTROSURGICAL) IMPLANT
GLOVE BIO SURGEON STRL SZ 6.5 (GLOVE) ×1 IMPLANT
GLOVE BIOGEL PI IND STRL 6.5 (GLOVE) IMPLANT
GLOVE BIOGEL PI IND STRL 7.0 (GLOVE) IMPLANT
GLOVE BIOGEL PI INDICATOR 6.5 (GLOVE) ×1
GLOVE BIOGEL PI INDICATOR 7.0 (GLOVE) ×3
GOWN STRL REUS W/TWL LRG LVL3 (GOWN DISPOSABLE) ×3 IMPLANT
HANDLE SUCTION POOLE (INSTRUMENTS) IMPLANT
KIT TURNOVER KIT A (KITS) ×1 IMPLANT
MANIFOLD NEPTUNE II (INSTRUMENTS) ×1 IMPLANT
MESH MARLEX PLUG MEDIUM (Mesh General) ×1 IMPLANT
NDL HYPO 18GX1.5 BLUNT FILL (NEEDLE) IMPLANT
NEEDLE HYPO 18GX1.5 BLUNT FILL (NEEDLE) ×2 IMPLANT
NEEDLE HYPO 22GX1.5 SAFETY (NEEDLE) ×1 IMPLANT
NS IRRIG 1000ML POUR BTL (IV SOLUTION) ×2 IMPLANT
PACK ABDOMINAL MAJOR (CUSTOM PROCEDURE TRAY) ×1 IMPLANT
PAD ARMBOARD 7.5X6 YLW CONV (MISCELLANEOUS) ×1 IMPLANT
SET BASIN LINEN APH (SET/KITS/TRAYS/PACK) ×1 IMPLANT
SPONGE T-LAP 18X18 ~~LOC~~+RFID (SPONGE) ×2 IMPLANT
SUCTION POOLE HANDLE (INSTRUMENTS) ×2
SUT ETHIBOND NAB MO 7 #0 18IN (SUTURE) ×1 IMPLANT
SUT VIC AB 2-0 CT2 27 (SUTURE) ×1 IMPLANT
SUT VIC AB 3-0 SH 27 (SUTURE) ×2
SUT VIC AB 3-0 SH 27X BRD (SUTURE) IMPLANT
TRAY FOLEY MTR SLVR 16FR STAT (SET/KITS/TRAYS/PACK) ×1 IMPLANT

## 2021-12-25 NOTE — Consult Note (Signed)
Subjective:   CC: incisional strangulated hernia  HPI:  [name] is a 76 y.o. female who was referred by Percell Miller for evaluation of above cc.   Symptoms were first noted 2 weeks ago. Pain is sharp, sudden onset, diffuse but more toward bilateral lower abdomen.  Associated with nausea that started today after acute exacerbation of the pain toay, exacerbated by nothing specific.  Known to have midline hernia but today is the first time she was told about RLQ hernia.  Lump is not reducible.     Past Medical History:  has a past medical history of Anxiety, Benign essential tremor, Cardiac murmur, Chronic diarrhea, Degenerative joint disease, Diabetes mellitus, Diabetic neuropathy ([place]), Diverticulosis, Essential hypertension, benign, GERD (gastroesophageal reflux disease), GI bleed, PONV (postoperative nausea and vomiting), Seasonal allergies, Shortness of breath, TIA (transient ischemic attack), Type 2 diabetes mellitus ([place]), and Urinary incontinence.  Past Surgical History:  Past Surgical History:  Procedure Laterality Date   ABDOMINAL HYSTERECTOMY     APPENDECTOMY     Arthroscopic left shoulder surgery     BREAST SURGERY     lumpectomy X 2-benign   CARPAL TUNNEL RELEASE Left 10/03/2012   Procedure: CARPAL TUNNEL RELEASE;  Surgeon: Cammie Sickle., MD;  Location: [facility];  Service: Orthopedics;  Laterality: Left;   CHOLECYSTECTOMY     COLON SURGERY     COLONOSCOPY  03/03/09   COLONOSCOPY  09/16/2004   ROURK   Colostomy with reversal     ESOPHAGOGASTRODUODENOSCOPY     EGD 06/08/2008   ESOPHAGOGASTRODUODENOSCOPY  09/16/04   DIAG EGD W/TCS   REPLACEMENT TOTAL KNEE BILATERAL  2001, 2005   Right elbow surgery     TENDON RECONSTRUCTION Left 10/03/2012   Procedure: LEFT MEDIAL ELBOW RECONSTRUCTION;  Surgeon: Cammie Sickle., MD;  Location: [facility];  Service: Orthopedics;  Laterality: Left;   TONSILLECTOMY      Family History: family history  includes Cancer in her father.  Social History:  reports that she quit smoking about 24 years ago. Her smoking use included cigarettes. She has a 80.00 pack-year smoking history. She has never used smokeless tobacco. She reports that she does not drink alcohol and does not use drugs.  Current Medications:  Prior to Admission medications   Medication Sig Start Date End Date Taking? Authorizing Provider  citalopram (CELEXA) 10 MG tablet Take 10 mg by mouth daily.    [provider]  clonazePAM (KLONOPIN) 0.5 MG tablet Take 0.5 mg by mouth 2 (two) times daily.     [provider]  dicyclomine (BENTYL) 20 MG tablet Take 1 tablet (20 mg total) by mouth daily. 11/08/19   Ezzard Standing, PA-C  diltiazem (CARDIZEM CD) 180 MG 24 hr capsule Take 180 mg by mouth daily.      [provider]  diphenoxylate-atropine (LOMOTIL) 2.5-0.025 MG tablet Take 1 tablet by mouth as needed. Ulcer stomach 01/25/17   Setzer, Rona Ravens, NP  empagliflozin (JARDIANCE) 25 MG TABS tablet Take by mouth daily.    [provider]  ergocalciferol (VITAMIN D2) 50000 UNITS capsule Take by mouth once a week.     [provider]  gabapentin (NEURONTIN) 100 MG capsule Take 100 mg by mouth at bedtime.    [provider]  glipiZIDE (GLUCOTROL XL) 5 MG 24 hr tablet Take 10 mg by mouth daily.  09/25/14   [provider]  Lactobacillus-Inulin ([place] PO) Take by mouth.  [provider]  levothyroxine (SYNTHROID, LEVOTHROID) 50 MCG tablet Take 50 mcg by mouth daily.    [provider]  lidocaine (LMX) 4 % cream Apply 1 application. topically as needed.    [provider]  lisinopril-hydrochlorothiazide (PRINZIDE,ZESTORETIC) 20-12.5 MG tablet Take 1 tablet by mouth daily.     [provider]  loratadine (CLARITIN) 10 MG tablet Take 10 mg by mouth daily as needed.     [provider]  lovastatin (MEVACOR) 20 MG tablet  Take 20 mg by mouth at bedtime.    [provider]  metFORMIN (GLUCOPHAGE) 500 MG tablet Take 500 mg by mouth. 2 am and 1 in pm 09/25/14   [provider]  metoprolol succinate (TOPROL-XL) 25 MG 24 hr tablet Take 25 mg by mouth daily.    [provider]  pantoprazole (PROTONIX) 40 MG tablet TAKE 1 TABLET BY MOUTH EVERY DAY IN THE MORNING 08/07/18   Setzer, Terri L, NP  pioglitazone (ACTOS) 15 MG tablet Take 15 mg by mouth daily.    [provider]  traMADol (ULTRAM) 50 MG tablet Take 50 mg by mouth every 6 (six) hours as needed. For pain    [provider]    Allergies:  Allergies as of 12/25/2021 - Review Complete 12/25/2021  Allergen Reaction Noted   Codeine  09/16/2010   Morphine and related  09/16/2010   Nsaids  02/08/2011   Valium [diazepam] Other (See Comments) 08/07/2012    ROS:  General: Denies weight loss, weight gain, fatigue, fevers, chills, and night sweats. Eyes: Denies blurry vision, double vision, eye pain, itchy eyes, and tearing. Ears: Denies hearing loss, earache, and ringing in ears. Nose: Denies sinus pain, congestion, infections, runny nose, and nosebleeds. Mouth/throat: Denies hoarseness, sore throat, bleeding gums, and difficulty swallowing. Heart: Denies chest pain, palpitations, racing heart, irregular heartbeat, leg pain or swelling, and decreased activity tolerance. Respiratory: Denies breathing difficulty, shortness of breath, wheezing, cough, and sputum. GI: Denies change in appetite, heartburn, nausea, vomiting, constipation, diarrhea, and blood in stool. GU: Denies difficulty urinating, pain with urinating, urgency, frequency, blood in urine. Musculoskeletal: Denies joint stiffness, pain, swelling, muscle weakness. Skin: Denies rash, itching, mass, tumors, sores, and boils Neurologic: Denies headache, fainting, dizziness, seizures, numbness, and tingling. Psychiatric: Denies depression, anxiety, difficulty  sleeping, and memory loss. Endocrine: Denies heat or cold intolerance, and increased thirst or urination. Blood/lymph: Denies easy bruising, easy bruising, and swollen glands     Objective:     BP (!) 165/100   Pulse 72   Temp 97.6 F (36.4 C) (Oral)   Resp 14   Ht '5\' 2"'$  (1.575 m)   Wt 77.1 kg   SpO2 94%   BMI 31.09 kg/m   Constitutional :  alert, cooperative, appears stated age, and mild distress  Lymphatics/Throat:  no asymmetry, masses, or scars  Respiratory:  clear to auscultation bilaterally  Cardiovascular:  regular rate and rhythm  Gastrointestinal: Soft, focal guarding in RLQ with rebound tenderness extending into LLQ . Palpable lump noted in RLQ, but not reducible, deep to area of visible scarring from previous surgery  Musculoskeletal: lying in bed, no obvious difficulty moving upper extremities  Skin: Cool and moist  Psychiatric: Normal affect, non-agitated, not confused       LABS:     Latest Ref Rng & Units 12/25/2021    1:47 PM 10/16/2013    3:54 PM 10/02/2012    2:20 PM  CMP  Glucose 70 - 99 mg/dL  310  115  130   BUN 8 - 23 mg/dL 33  18  22   Creatinine 0.44 - 1.00 mg/dL 1.62  1.10  0.93   Sodium 135 - 145 mmol/L 138  143  142   Potassium 3.5 - 5.1 mmol/L 3.8  4.2  3.6   Chloride 98 - 111 mmol/L 105  106  99   CO2 22 - 32 mmol/L '24  29  27   '$ Calcium 8.9 - 10.3 mg/dL 8.6  9.9  9.6   Total Protein 6.5 - 8.1 g/dL 7.7  6.5    Total Bilirubin 0.3 - 1.2 mg/dL 0.5  0.4    Alkaline Phos 38 - 126 U/L 77  64    AST 15 - 41 U/L 19  25    ALT 0 - 44 U/L 12  25        Latest Ref Rng & Units 12/25/2021    1:47 PM 10/16/2013    3:49 PM 10/03/2012    8:29 AM  CBC  WBC 4.0 - 10.5 K/uL 11.8  9.3    Hemoglobin 12.0 - 15.0 g/dL 17.1  13.3  12.0   Hematocrit 36.0 - 46.0 % 53.7  38.5    Platelets 150 - 400 K/uL 428  280      RADS: CLINICAL DATA:  Generalized abdominal pain for 2 weeks, urinary retention, nausea   EXAM: CT ABDOMEN AND PELVIS WITH CONTRAST    TECHNIQUE: Multidetector CT imaging of the abdomen and pelvis was performed using the standard protocol following bolus administration of intravenous contrast.   RADIATION DOSE REDUCTION: This exam was performed according to the departmental dose-optimization program which includes automated exposure control, adjustment of the mA and/or kV according to patient size and/or use of iterative reconstruction technique.   CONTRAST:  24m OMNIPAQUE IOHEXOL 300 MG/ML  SOLN   COMPARISON:  12/18/2021   FINDINGS: Lower chest: No acute pleural or parenchymal lung disease.   Hepatobiliary: No focal liver abnormality is seen. Status post cholecystectomy. No biliary dilatation.   Pancreas: Unremarkable. No pancreatic ductal dilatation or surrounding inflammatory changes.   Spleen: Normal in size without focal abnormality.   Adrenals/Urinary Tract: Numerous stable hypodensities throughout the bilateral kidneys consistent with simple and hemorrhagic cysts. No specific imaging follow-up is required. No urinary tract calculi or obstructive uropathy within either kidney. The adrenals and bladder are unremarkable.   Stomach/Bowel: No bowel obstruction or ileus. Stable colonic diverticulosis without evidence of acute diverticulitis. No bowel wall thickening or inflammatory change. Evidence of prior partial sigmoid colon resection and reanastomosis.   Vascular/Lymphatic: Aortic atherosclerosis. No enlarged abdominal or pelvic lymph nodes.   Reproductive: Status post hysterectomy. No adnexal masses.   Other: No free fluid or free intraperitoneal gas.   A small fat containing supraumbilical midline ventral hernia is identified, without associated bowel herniation.   There is a second abdominal wall hernia within the right lower quadrant, reference image 61/2. At this location, there is a 1.2 by 2.0 cm abdominal wall defect, which contains herniated mesenteric fat and a small portion of the  anti mesenteric wall of the cecum, reference image 62/2. No evidence of incarceration or bowel obstruction.   Musculoskeletal: No acute or destructive bony lesions. Reconstructed images demonstrate no additional findings.   IMPRESSION: 1. Right lower quadrant ventral hernia, containing herniated mesenteric fat and a small portion of the anti mesenteric wall the cecum. Please correlate with site of patient discomfort. No evidence of incarceration or bowel  obstruction at this time. 2. Small fat containing midline supraumbilical ventral hernia. 3. Diverticulosis without evidence of acute diverticulitis. 4.  Aortic Atherosclerosis (ICD10-I70.0).     Electronically Signed   By: Randa Ngo M.D.   On: 12/25/2021 15:27   Assessment:   Incisional hernia- CT report notes no obvious signs of incarceration but with leukocytosis and extreme pain that is diffuse on physical exam, clinical suspicion is very high for possible strangulation of the involved cecum, so recommend emergent surgical intervention.  Plan:    Discussed the risk of surgery including recurrence, which can be up to 50% in the case of incisional or complex hernias, possible use of prosthetic materials (mesh) and the increased risk of mesh infxn if used, bleeding, chronic pain, post-op infxn, post-op SBO or ileus, and possible re-operation to address said risks. The risks of general anesthetic, if used, includes MI, CVA, sudden death or even reaction to anesthetic medications also discussed. Alternatives include continued observation.  Benefits include possible symptom relief, prevention of incarceration, strangulation, enlargement in size over time, and the risk of emergency surgery in the face of strangulation.  Typical post-op recovery time of 3-5 days with 4-6 weeks of activity restrictions were also discussed.  The patient verbalized understanding and all questions were answered to the patient's satisfaction.  To OR. IVF,  ancef in the meantime  labs/images/medications/previous chart entries reviewed personally and relevant changes/updates noted above.

## 2021-12-25 NOTE — ED Notes (Signed)
Patient transported to CT 

## 2021-12-25 NOTE — Anesthesia Procedure Notes (Addendum)
Procedure Name: Intubation Date/Time: 12/25/2021 5:39 PM  Performed by: Louann Sjogren, MDPre-anesthesia Checklist: Patient identified, Emergency Drugs available, Suction available and Patient being monitored Patient Re-evaluated:Patient Re-evaluated prior to induction Oxygen Delivery Method: Circle system utilized Preoxygenation: Pre-oxygenation with 100% oxygen Induction Type: IV induction, Rapid sequence and Cricoid Pressure applied Ventilation: Mask ventilation without difficulty Grade View: Grade II Tube type: Oral Tube size: 7.5 mm Number of attempts: 1 Airway Equipment and Method: Stylet and Oral airway Placement Confirmation: ETT inserted through vocal cords under direct vision, positive ETCO2 and breath sounds checked- equal and bilateral Tube secured with: Tape Dental Injury: Teeth and Oropharynx as per pre-operative assessment

## 2021-12-25 NOTE — ED Provider Notes (Signed)
[facility] EMERGENCY DEPARTMENT Provider Note   CSN: [id number] Arrival date & time: 12/25/21  1312     History  Chief Complaint  Patient presents with   Abdominal Pain    [name] is a 76 y.o. female.  76 year old female with past medical history of hypertension, diabetes, neuropathy, GI bleed, TIA, diverticulosis presents with concern for abdominal pain x2 weeks.  Reports pain to be across her lower abdomen, radiates up both flanks.  Patient went to her PCP who put her on Augmentin for suspected diverticulitis.  Pain initially improved however has come back and is worse.  Patient had a CT scan of her abdomen pelvis without contrast at the end of last month which was unremarkable.  Patient follow-up with PCP today with concern for worsening pain and was sent to the emergency room for further evaluation.  Pain is worse with palpation and lying on her right side, associated with nausea and reflux as well as increased burping and flatulence.  Notes that her stools are alternating loose and formed and states this is not typical for her but has been ongoing for the past 2 weeks.  Denies blood in her stools.  No urinary symptoms, denies fevers and chills.  Denies shortness of breath or chest pain, notes that the pain comes of both of her sides and feels like a gas bubble sitting underneath her heart on the left side.  Prior abdominal surgeries include hysterectomy, oophorectomy, cholecystectomy, appendectomy as well as colectomy with colostomy and reversal.       Home Medications Prior to Admission medications   Medication Sig Start Date End Date Taking? Authorizing Provider  citalopram (CELEXA) 10 MG tablet Take 10 mg by mouth daily.    [provider]  clonazePAM (KLONOPIN) 0.5 MG tablet Take 0.5 mg by mouth 2 (two) times daily.     [provider]  dicyclomine (BENTYL) 20 MG tablet Take 1 tablet (20 mg total) by mouth daily. 11/08/19   Ezzard Standing, PA-C   diltiazem (CARDIZEM CD) 180 MG 24 hr capsule Take 180 mg by mouth daily.      [provider]  diphenoxylate-atropine (LOMOTIL) 2.5-0.025 MG tablet Take 1 tablet by mouth as needed. Ulcer stomach 01/25/17   Setzer, Rona Ravens, NP  empagliflozin (JARDIANCE) 25 MG TABS tablet Take by mouth daily.    [provider]  ergocalciferol (VITAMIN D2) 50000 UNITS capsule Take by mouth once a week.     [provider]  gabapentin (NEURONTIN) 100 MG capsule Take 100 mg by mouth at bedtime.    [provider]  glipiZIDE (GLUCOTROL XL) 5 MG 24 hr tablet Take 10 mg by mouth daily.  09/25/14   [provider]  Lactobacillus-Inulin ([place] PO) Take by mouth.    [provider]  levothyroxine (SYNTHROID, LEVOTHROID) 50 MCG tablet Take 50 mcg by mouth daily.    [provider]  lidocaine (LMX) 4 % cream Apply 1 application. topically as needed.    [provider]  lisinopril-hydrochlorothiazide (PRINZIDE,ZESTORETIC) 20-12.5 MG tablet Take 1 tablet by mouth daily.     [provider]  loratadine (CLARITIN) 10 MG tablet Take 10 mg by mouth daily as needed.     [provider]  lovastatin (MEVACOR) 20 MG tablet Take 20 mg by mouth at bedtime.    [provider]  metFORMIN (GLUCOPHAGE) 500 MG tablet Take 500 mg by mouth. 2 am and 1 in pm 09/25/14  [provider]  metoprolol succinate (TOPROL-XL) 25 MG 24 hr tablet Take 25 mg by mouth daily.    [provider]  pantoprazole (PROTONIX) 40 MG tablet TAKE 1 TABLET BY MOUTH EVERY DAY IN THE MORNING 08/07/18   Setzer, Terri L, NP  pioglitazone (ACTOS) 15 MG tablet Take 15 mg by mouth daily.    [provider]  traMADol (ULTRAM) 50 MG tablet Take 50 mg by mouth every 6 (six) hours as needed. For pain    [provider]      Allergies    Codeine, Morphine and related, Nsaids, and Valium [diazepam]    Review of Systems   Review  of Systems Negative except as per HPI Physical Exam Updated Vital Signs BP (!) 197/83   Pulse 75   Temp 97.6 F (36.4 C) (Oral)   Resp 16   Ht '5\' 2"'$  (1.575 m)   Wt 77.1 kg   SpO2 93%   BMI 31.09 kg/m  Physical Exam Vitals and nursing note reviewed.  Constitutional:      General: She is not in acute distress.    Appearance: She is well-developed. She is not diaphoretic.  HENT:     Head: Normocephalic and atraumatic.  Cardiovascular:     Rate and Rhythm: Normal rate and regular rhythm.     Heart sounds: Normal heart sounds.  Pulmonary:     Effort: Pulmonary effort is normal.     Breath sounds: Normal breath sounds.  Abdominal:     General: Bowel sounds are decreased.     Tenderness: There is generalized abdominal tenderness and tenderness in the right lower quadrant, suprapubic area and left lower quadrant. There is no right CVA tenderness or left CVA tenderness.  Skin:    General: Skin is warm and dry.     Findings: No erythema or rash.  Neurological:     Mental Status: She is alert and oriented to person, place, and time.     Motor: Tremor present.  Psychiatric:        Behavior: Behavior normal.     ED Results / Procedures / Treatments   Labs (all labs ordered are listed, but only abnormal results are displayed) Labs Reviewed  COMPREHENSIVE METABOLIC PANEL - Abnormal; Notable for the following components:      Result Value   Glucose, Bld 310 (*)    BUN 33 (*)    Creatinine, Ser 1.62 (*)    Calcium 8.6 (*)    GFR, Estimated 32 (*)    All other components within normal limits  CBC - Abnormal; Notable for the following components:   WBC 11.8 (*)    RBC 6.32 (*)    Hemoglobin 17.1 (*)    HCT 53.7 (*)    RDW 20.6 (*)    Platelets 428 (*)    All other components within normal limits  URINALYSIS, ROUTINE W REFLEX MICROSCOPIC - Abnormal; Notable for the following components:   Color, Urine STRAW (*)    Glucose, UA >=500 (*)    Hgb urine dipstick SMALL (*)     Protein, ur 100 (*)    All other components within normal limits  LIPASE, BLOOD  TROPONIN I (HIGH SENSITIVITY)    EKG EKG Interpretation  Date/Time:  Friday December 25 2021 13:50:59 EDT Ventricular Rate:  69 PR Interval:  224 QRS Duration: 137 QT Interval:  465 QTC Calculation: 499 R Axis:   28 Text Interpretation: Sinus rhythm Prolonged PR interval Left bundle  branch block new LBBB compared with 2014 Confirmed by Noemi Chapel [phone]) on 12/25/2021 3:38:51 PM  Radiology CT Abdomen Pelvis W Contrast  Result Date: 12/25/2021 CLINICAL DATA:  Generalized abdominal pain for 2 weeks, urinary retention, nausea EXAM: CT ABDOMEN AND PELVIS WITH CONTRAST TECHNIQUE: Multidetector CT imaging of the abdomen and pelvis was performed using the standard protocol following bolus administration of intravenous contrast. RADIATION DOSE REDUCTION: This exam was performed according to the departmental dose-optimization program which includes automated exposure control, adjustment of the mA and/or kV according to patient size and/or use of iterative reconstruction technique. CONTRAST:  33m OMNIPAQUE IOHEXOL 300 MG/ML  SOLN COMPARISON:  12/18/2021 FINDINGS: Lower chest: No acute pleural or parenchymal lung disease. Hepatobiliary: No focal liver abnormality is seen. Status post cholecystectomy. No biliary dilatation. Pancreas: Unremarkable. No pancreatic ductal dilatation or surrounding inflammatory changes. Spleen: Normal in size without focal abnormality. Adrenals/Urinary Tract: Numerous stable hypodensities throughout the bilateral kidneys consistent with simple and hemorrhagic cysts. No specific imaging follow-up is required. No urinary tract calculi or obstructive uropathy within either kidney. The adrenals and bladder are unremarkable. Stomach/Bowel: No bowel obstruction or ileus. Stable colonic diverticulosis without evidence of acute diverticulitis. No bowel wall thickening or inflammatory change. Evidence of  prior partial sigmoid colon resection and reanastomosis. Vascular/Lymphatic: Aortic atherosclerosis. No enlarged abdominal or pelvic lymph nodes. Reproductive: Status post hysterectomy. No adnexal masses. Other: No free fluid or free intraperitoneal gas. A small fat containing supraumbilical midline ventral hernia is identified, without associated bowel herniation. There is a second abdominal wall hernia within the right lower quadrant, reference image 61/2. At this location, there is a 1.2 by 2.0 cm abdominal wall defect, which contains herniated mesenteric fat and a small portion of the anti mesenteric wall of the cecum, reference image 62/2. No evidence of incarceration or bowel obstruction. Musculoskeletal: No acute or destructive bony lesions. Reconstructed images demonstrate no additional findings. IMPRESSION: 1. Right lower quadrant ventral hernia, containing herniated mesenteric fat and a small portion of the anti mesenteric wall the cecum. Please correlate with site of patient discomfort. No evidence of incarceration or bowel obstruction at this time. 2. Small fat containing midline supraumbilical ventral hernia. 3. Diverticulosis without evidence of acute diverticulitis. 4.  Aortic Atherosclerosis (ICD10-I70.0). Electronically Signed   By: MRanda NgoM.D.   On: 12/25/2021 15:27    Procedures Procedures    Medications Ordered in ED Medications  HYDROmorphone (DILAUDID) injection 0.5 mg (0.5 mg Intravenous Given 12/25/21 1428)  0.9 %  sodium chloride infusion (has no administration in time range)  ondansetron (ZOFRAN) injection 4 mg (4 mg Intravenous Given 12/25/21 1428)  sodium chloride 0.9 % bolus 500 mL (0 mLs Intravenous Stopped 12/25/21 1502)  iohexol (OMNIPAQUE) 300 MG/ML solution 75 mL (80 mLs Intravenous Contrast Given 12/25/21 1458)  ondansetron (ZOFRAN) injection 4 mg (4 mg Intravenous Given 12/25/21 1529)  hydrALAZINE (APRESOLINE) injection 20 mg (20 mg Intravenous Given 12/25/21 1605)     ED Course/ Medical Decision Making/ A&P                           Medical Decision Making Amount and/or Complexity of Data Reviewed Labs: ordered. Radiology: ordered.  Risk Prescription drug management. Decision regarding hospitalization.   This patient presents to the ED for concern of lower abdominal pain x2 weeks, this involves an extensive number of treatment options, and is a complaint that carries with it a high risk of complications and morbidity.  The differential diagnosis includes but not limited to diverticulitis, SBO, mass, UTI, mesenteric ischemia   Co morbidities that complicate the patient evaluation  Hypertension, diabetes, GERD, IBS   Additional history obtained:  Additional history obtained from husband at bedside who agrees patient typically drinks 3-5 bottles of water daily, has not been drinking much water today though External records from outside source obtained and reviewed including CT abdomen pelvis without contrast completed on 12/18/2021 without acute findings.   Lab Tests:  I Ordered, and personally interpreted labs.  The pertinent results include: CBC with mild leukocytosis with white count of 11.8.  CMP with AKI, increased creatinine 1.62 with BUN of 33 and GFR of 32.  Lipase is normal.  Urinalysis positive for protein and glucose, negative for evidence of infection.  Troponin obtained due to complaint of lower abdominal pain which radiates to bilateral flanks, negative at 6.   Imaging Studies ordered:  I ordered imaging studies including CT abdomen pelvis with contrast I independently visualized and interpreted imaging which showed negative for SBO.  Does have ventral hernia x2, containing fat.  Is not tender in this area. I agree with the radiologist interpretation   Cardiac Monitoring: / EKG:  The patient was maintained on a cardiac monitor.  I personally viewed and interpreted the cardiac monitored which showed an underlying rhythm of:  Sinus rhythm, rate 69.  New left bundle branch block when compared to EKG from 2014.  Patient notes ongoing to cardiology once previously after her sister was diagnosed with something which prompted patient to be evaluated.  States that she could not tolerate the treadmill test but otherwise denies known cardiac history.  Denies having chest pain today.   Consultations Obtained:  I requested consultation with the general surgeon, Dr. Lysle Pearl,  and discussed lab and imaging findings as well as pertinent plan - they recommend: will see the patient, plan is for medical admission. Case discussed with Dr. Waldron Labs with [facility] hospitalist service who will consult for admission.   Problem List / ED Course / Critical interventions / Medication management  76 year old female with complaint of lower abdominal pain x 2 weeks, sent by PCP for failure to improve with outpatient antibiotics, negative non con a/p CT. found to have generalized abdominal tenderness, worse across the lower abdomen more so on the left lower quadrant.  Labs show mild AKI with creatinine of 1.62, increase in BUN to 33 and GFR of 32 with mild leukocytosis white count 11.8.  Patient was given small fluid bolus in the emergency room for this prior to CT abdomen pelvis with contrast.  Her CT abdomen pelvis shows 2 small ventral hernias without evidence of incarceration.  Patient is aware of these hernias and is not particularly tender in these areas.  Her pain somewhat improved with the Dilaudid given although she is still quite tender and still more so in the left lower quadrant.  Given duration and degree of persistent pain, discussed with Dr. Sabra Heck, ER attending who recommends consult with general surgery and admission to hospital service.  General surgery consulted, agrees to evaluate patient and weigh in on her care.  Discussed with hospitalist who agrees to admit.  Patient is updated with results and plan of care and is agreeable. I ordered  medication including Zofran, dilaudid, hydralazine  for abdominal pain, nausea, vomiting, hypertension   Reevaluation of the patient after these medicines showed that the patient improved I have reviewed the patients home medicines and have made adjustments  as needed   Social Determinants of Health:  Has PCP, lives with spouse at home   Test / Admission - Considered:  Plan is to admit for further work-up, consider CTA however GFR is 32 and which is contrast dosed for CT and pelvis.         Final Clinical Impression(s) / ED Diagnoses Final diagnoses:  Lower abdominal pain  AKI (acute kidney injury) ([place])  Ventral hernia without obstruction or gangrene  Hypertension, unspecified type    Rx / DC Orders ED Discharge Orders     None         Tacy Learn, PA-C 12/25/21 1625    Noemi Chapel, MD 01/03/22 [phone]

## 2021-12-25 NOTE — Transfer of Care (Signed)
Immediate Anesthesia Transfer of Care Note  Patient: [name]  Procedure(s) Performed: HERNIA REPAIR INCISIONAL  Patient Location: PACU  Anesthesia Type:General  Level of Consciousness: awake and alert   Airway & Oxygen Therapy: Patient Spontanous Breathing and Patient connected to nasal cannula oxygen  Post-op Assessment: Report given to RN and Post -op Vital signs reviewed and stable  Post vital signs: Reviewed and stable  Last Vitals:  Vitals Value Taken Time  BP 181/69   Temp 97.6   Pulse 85   Resp 16   SpO2 92     Last Pain:  Vitals:   12/25/21 1427  TempSrc:   PainSc: 9          Complications: No notable events documented.

## 2021-12-25 NOTE — ED Triage Notes (Signed)
Pt presents with abdominal pain x 2 weeks, sent by Dr. Tereasa Coop office out of [place], [place], CT done at [facility] last week negative, PCP concerned with SBO or Pelvic Mass.  Also having some urinary retention.

## 2021-12-25 NOTE — Anesthesia Preprocedure Evaluation (Signed)
Anesthesia Evaluation  Patient identified by MRN, date of birth, ID band Patient awake    Reviewed: Allergy & Precautions, H&P , NPO status , Patient's Chart, lab work & pertinent test results, reviewed documented beta blocker date and time   History of Anesthesia Complications (+) PONV and history of anesthetic complications  Airway Mallampati: II  TM Distance: >3 FB Neck ROM: full    Dental no notable dental hx.    Pulmonary shortness of breath, former smoker,    Pulmonary exam normal breath sounds clear to auscultation       Cardiovascular Exercise Tolerance: Good hypertension, negative cardio ROS   Rhythm:regular Rate:Normal     Neuro/Psych PSYCHIATRIC DISORDERS Anxiety TIA   GI/Hepatic Neg liver ROS, GERD  Medicated,  Endo/Other  negative endocrine ROSdiabetes, Type 2  Renal/GU Renal disease  negative genitourinary   Musculoskeletal   Abdominal   Peds  Hematology negative hematology ROS (+)   Anesthesia Other Findings   Reproductive/Obstetrics negative OB ROS                             Anesthesia Physical Anesthesia Plan  ASA: 4 and emergent  Anesthesia Plan: General and General ETT   Post-op Pain Management:    Induction:   PONV Risk Score and Plan: Ondansetron  Airway Management Planned:   Additional Equipment:   Intra-op Plan:   Post-operative Plan:   Informed Consent: I have reviewed the patients History and Physical, chart, labs and discussed the procedure including the risks, benefits and alternatives for the proposed anesthesia with the patient or authorized representative who has indicated his/her understanding and acceptance.     Dental Advisory Given  Plan Discussed with: CRNA  Anesthesia Plan Comments:         Anesthesia Quick Evaluation

## 2021-12-25 NOTE — H&P (Signed)
TRH H&P   Patient Demographics:    [name], is a 76 y.o. female  MRN: [id number]   DOB - 29-Jun-1945  Admit Date - 12/25/2021  Outpatient Primary MD for the patient is Glenda Chroman, MD  Referring MD/NP/PA: PA Murphy   Patient coming from: Home  Chief Complaint  Patient presents with   Abdominal Pain      HPI:    [name]  is a 76 y.o. female, with past medical history of hypertension, diabetes mellitus, neuropathy, GI bleed, TIA, diverticulosis, irritable bowel syndrome patient presents to ED secondary to complaints of abdominal pain. -Reports abdominal pain x2 weeks, report diffuse abdominal pain, seen by her PCP recently where she was given Augmentin for presumed diverticulitis, initially improved, but then got worse, she had CT abdomen pelvis a week ago, which was unremarkable, and was sent today by her PCP due to worsening abdominal pain, she reports diarrhea that started to develop today, reports nausea, she had an episode of vomiting while in ED, denies fever, chills, and urinary symptoms, no chest pain or shortness of breath. - in ED CT abdomen and pelvis showing ventral hernia with, labs were significant for dehydration with creatinine of 1.65, hemoglobin of 17, platelet count of 423 K, white blood cell count elevated at 11.8, patient was seen by general surgery with concern of incarcerated incisional hernia with recommendation for emergent surgery, [facility] hospitalist consulted to admit.   Review of systems:      A full 10 point Review of Systems was done, except as stated above, all other Review of Systems were negative.   With Past History of the following :    Past Medical History:  Diagnosis Date   Anxiety    Benign essential tremor    Cardiac murmur    Reportedly for years-mild   Chronic diarrhea    Degenerative joint disease    Diabetes mellitus     Diabetic neuropathy (HCC)    Diverticulosis    Essential hypertension, benign    GERD (gastroesophageal reflux disease)    GI bleed    NSAID use   PONV (postoperative nausea and vomiting)    Seasonal allergies    Shortness of breath    TIA (transient ischemic attack)    1998   Type 2 diabetes mellitus ([place])    Urinary incontinence       Past Surgical History:  Procedure Laterality Date   ABDOMINAL HYSTERECTOMY     APPENDECTOMY     Arthroscopic left shoulder surgery     BREAST SURGERY     lumpectomy X 2-benign   CARPAL TUNNEL RELEASE Left 10/03/2012   Procedure: CARPAL TUNNEL RELEASE;  Surgeon: Cammie Sickle., MD;  Location: [place];  Service: Orthopedics;  Laterality: Left;   CHOLECYSTECTOMY     COLON SURGERY     COLONOSCOPY  03/03/09  COLONOSCOPY  09/16/2004   ROURK   Colostomy with reversal     ESOPHAGOGASTRODUODENOSCOPY     EGD 06/08/2008   ESOPHAGOGASTRODUODENOSCOPY  09/16/04   DIAG EGD W/TCS   REPLACEMENT TOTAL KNEE BILATERAL  2001, 2005   Right elbow surgery     TENDON RECONSTRUCTION Left 10/03/2012   Procedure: LEFT MEDIAL ELBOW RECONSTRUCTION;  Surgeon: Cammie Sickle., MD;  Location: [place];  Service: Orthopedics;  Laterality: Left;   TONSILLECTOMY        Social History:     Social History   Tobacco Use   Smoking status: Former    Packs/day: 2.00    Years: 40.00    Total pack years: 80.00    Types: Cigarettes    Quit date: 11/02/1997    Years since quitting: 24.1   Smokeless tobacco: Never   Tobacco comments:    quit June [id number]  Substance Use Topics   Alcohol use: No    Alcohol/week: 0.0 standard drinks of alcohol        Family History :     Family History  Problem Relation Age of Onset   Cancer Father        Leukemia   Colon cancer Neg Hx       Home Medications:   Prior to Admission medications   Medication Sig Start Date End Date Taking? Authorizing Provider  citalopram (CELEXA)  10 MG tablet Take 10 mg by mouth daily.    [provider]  clonazePAM (KLONOPIN) 0.5 MG tablet Take 0.5 mg by mouth 2 (two) times daily.     [provider]  dicyclomine (BENTYL) 20 MG tablet Take 1 tablet (20 mg total) by mouth daily. 11/08/19   WEzzard Standing PA-C  diltiazem (CARDIZEM CD) 180 MG 24 hr capsule Take 180 mg by mouth daily.      [provider]  diphenoxylate-atropine (LOMOTIL) 2.5-0.025 MG tablet Take 1 tablet by mouth as needed. Ulcer stomach 01/25/17   Setzer, TRona Ravens NP  empagliflozin (JARDIANCE) 25 MG TABS tablet Take by mouth daily.    [provider]  ergocalciferol (VITAMIN D2) 50000 UNITS capsule Take by mouth once a week.     [provider]  gabapentin (NEURONTIN) 100 MG capsule Take 100 mg by mouth at bedtime.    [provider]  glipiZIDE (GLUCOTROL XL) 5 MG 24 hr tablet Take 10 mg by mouth daily.  09/25/14   [provider]  Lactobacillus-Inulin (CManillaPO) Take by mouth.    [provider]  levothyroxine (SYNTHROID, LEVOTHROID) 50 MCG tablet Take 50 mcg by mouth daily.    [provider]  lidocaine (LMX) 4 % cream Apply 1 application. topically as needed.    [provider]  lisinopril-hydrochlorothiazide (PRINZIDE,ZESTORETIC) 20-12.5 MG tablet Take 1 tablet by mouth daily.     [provider]  loratadine (CLARITIN) 10 MG tablet Take 10 mg by mouth daily as needed.     [provider]  lovastatin (MEVACOR) 20 MG tablet Take 20 mg by mouth at bedtime.    [provider]  metFORMIN (GLUCOPHAGE) 500 MG tablet Take 500 mg by mouth. 2 am and 1 in pm 09/25/14   [provider]  metoprolol succinate (TOPROL-XL) 25 MG 24 hr tablet Take 25 mg by mouth daily.    [provider]  pantoprazole (PROTONIX) 40 MG tablet TAKE 1 TABLET BY MOUTH EVERY DAY IN THE MORNING 08/07/18  Setzer, Terri L, NP  pioglitazone (ACTOS) 15 MG tablet  Take 15 mg by mouth daily.    [provider]  traMADol (ULTRAM) 50 MG tablet Take 50 mg by mouth every 6 (six) hours as needed. For pain    [provider]     Allergies:     Allergies  Allergen Reactions   Codeine     "Increases heart rate"    Morphine And Related     Hallucinations    Nsaids     Hx  GI bleed   Valium [Diazepam] Other (See Comments)    Patient states that this medication made her want to commit sucide     Physical Exam:   Vitals  Blood pressure (!) 165/100, pulse 72, temperature 97.6 F (36.4 C), temperature source Oral, resp. rate 14, height '5\' 2"'$  (1.575 m), weight 77.1 kg, SpO2 94 %.   1. General well-developed female, uncomfortable due to pain  2. Normal affect and insight, Not Suicidal or Homicidal, Awake Alert, Oriented X 3.  3. No F.N deficits, ALL C.Nerves Intact, Strength 5/5 all 4 extremities, Sensation intact all 4 extremities, Plantars down going.  4. Ears and Eyes appear Normal, Conjunctivae clear, PERRLA. Moist Oral Mucosa.  5. Supple Neck, No JVD, No cervical lymphadenopathy appriciated, No Carotid Bruits.  6. Symmetrical Chest wall movement, Good air movement bilaterally, CTAB.  7. RRR, No Gallops, Rubs or Murmurs, No Parasternal Heave.  8. Positive Bowel Sounds, Fuhs tenderness to palpation, more pronounced in the left lower abdomen with guarding.    9.  No Cyanosis, Normal Skin Turgor, No Skin Rash or Bruise.  10. Good muscle tone,  joints appear normal , no effusions, Normal ROM.  11. No Palpable Lymph Nodes in Neck or Axillae    Data Review:    CBC Recent Labs  Lab 12/25/21 1347  WBC 11.8*  HGB 17.1*  HCT 53.7*  PLT 428*  MCV 85.0  MCH 27.1  MCHC 31.8  RDW 20.6*   ------------------------------------------------------------------------------------------------------------------  Chemistries  Recent Labs  Lab 12/25/21 1347  NA 138  K 3.8  CL 105  CO2 24  GLUCOSE 310*  BUN 33*   CREATININE 1.62*  CALCIUM 8.6*  AST 19  ALT 12  ALKPHOS 77  BILITOT 0.5   ------------------------------------------------------------------------------------------------------------------ estimated creatinine clearance is 27.5 mL/min (A) (by C-G formula based on SCr of 1.62 mg/dL (H)). ------------------------------------------------------------------------------------------------------------------ No results for input(s): "TSH", "T4TOTAL", "T3FREE", "THYROIDAB" in the last 72 hours.  Invalid input(s): "FREET3"  Coagulation profile No results for input(s): "INR", "PROTIME" in the last 168 hours. ------------------------------------------------------------------------------------------------------------------- No results for input(s): "DDIMER" in the last 72 hours. -------------------------------------------------------------------------------------------------------------------  Cardiac Enzymes No results for input(s): "CKMB", "TROPONINI", "MYOGLOBIN" in the last 168 hours.  Invalid input(s): "CK" ------------------------------------------------------------------------------------------------------------------ No results found for: "BNP"   ---------------------------------------------------------------------------------------------------------------  Urinalysis    Component Value Date/Time   COLORURINE STRAW (A) 12/25/2021 1530   APPEARANCEUR CLEAR 12/25/2021 1530   LABSPEC 1.014 12/25/2021 1530   PHURINE 5.0 12/25/2021 1530   GLUCOSEU >=500 (A) 12/25/2021 1530   HGBUR SMALL (A) 12/25/2021 1530   BILIRUBINUR NEGATIVE 12/25/2021 1530   KETONESUR NEGATIVE 12/25/2021 1530   PROTEINUR 100 (A) 12/25/2021 1530   NITRITE NEGATIVE 12/25/2021 1530   LEUKOCYTESUR NEGATIVE 12/25/2021 1530    ----------------------------------------------------------------------------------------------------------------   Imaging Results:    CT Abdomen Pelvis W Contrast  Result Date:  12/25/2021 CLINICAL DATA:  Generalized abdominal pain for 2 weeks, urinary retention, nausea EXAM: CT ABDOMEN AND PELVIS  WITH CONTRAST TECHNIQUE: Multidetector CT imaging of the abdomen and pelvis was performed using the standard protocol following bolus administration of intravenous contrast. RADIATION DOSE REDUCTION: This exam was performed according to the departmental dose-optimization program which includes automated exposure control, adjustment of the mA and/or kV according to patient size and/or use of iterative reconstruction technique. CONTRAST:  79m OMNIPAQUE IOHEXOL 300 MG/ML  SOLN COMPARISON:  12/18/2021 FINDINGS: Lower chest: No acute pleural or parenchymal lung disease. Hepatobiliary: No focal liver abnormality is seen. Status post cholecystectomy. No biliary dilatation. Pancreas: Unremarkable. No pancreatic ductal dilatation or surrounding inflammatory changes. Spleen: Normal in size without focal abnormality. Adrenals/Urinary Tract: Numerous stable hypodensities throughout the bilateral kidneys consistent with simple and hemorrhagic cysts. No specific imaging follow-up is required. No urinary tract calculi or obstructive uropathy within either kidney. The adrenals and bladder are unremarkable. Stomach/Bowel: No bowel obstruction or ileus. Stable colonic diverticulosis without evidence of acute diverticulitis. No bowel wall thickening or inflammatory change. Evidence of prior partial sigmoid colon resection and reanastomosis. Vascular/Lymphatic: Aortic atherosclerosis. No enlarged abdominal or pelvic lymph nodes. Reproductive: Status post hysterectomy. No adnexal masses. Other: No free fluid or free intraperitoneal gas. A small fat containing supraumbilical midline ventral hernia is identified, without associated bowel herniation. There is a second abdominal wall hernia within the right lower quadrant, reference image 61/2. At this location, there is a 1.2 by 2.0 cm abdominal wall defect, which  contains herniated mesenteric fat and a small portion of the anti mesenteric wall of the cecum, reference image 62/2. No evidence of incarceration or bowel obstruction. Musculoskeletal: No acute or destructive bony lesions. Reconstructed images demonstrate no additional findings. IMPRESSION: 1. Right lower quadrant ventral hernia, containing herniated mesenteric fat and a small portion of the anti mesenteric wall the cecum. Please correlate with site of patient discomfort. No evidence of incarceration or bowel obstruction at this time. 2. Small fat containing midline supraumbilical ventral hernia. 3. Diverticulosis without evidence of acute diverticulitis. 4.  Aortic Atherosclerosis (ICD10-I70.0). Electronically Signed   By: MRanda NgoM.D.   On: 12/25/2021 15:27    My personal review of EKG: Rhythm NSR, Rate LBBB   Assessment & Plan:    Principal Problem:   [place] Problems:   Essential hypertension, benign   Diabetes mellitus (HCC)   Tremor   IBS (irritable bowel syndrome)   GERD (gastroesophageal reflux disease)   Abdominal pain    Abdominal pain/high clinical suspicion for incarcerated incisional hernia -Patient presents with progressive abdominal pain over the last few days. -General surgery input greatly appreciated, significant concern for incarcerated incisional hernia, should patient be taken emergently to the OR. -Patient EKG showing left bundle branch block, no recent EKG to compare, she has no cardiac symptoms, patient is going surgery emergently, continue with beta-blockers perioperatively.  AKI -Due to volume depletion and dehydration, continue with IV fluids and hold nephrotoxic medications  Hypertension -Blood pressure significantly elevated, most likely due to pain, should improve after pain control -Certainly she can take oral for some time after her surgery, so we will keep on scheduled metoprolol 5 mg every 6 hours and as needed  hydralazine  Diabetes mellitus -Will hold Jardiance, glipizide, Actos and metformin, -Will keep an insulin sliding scale  GERD -We will keep on IV Protonix Hyperlipidemia -Resume statin when she improves  Hypothyroidism -Synthroid can be resumed once she is able to take oral, and if she remains n.p.o. for> 72 hours then she can be started  on IV Synthroid  Secondary polycythemia -Due to dehydration and volume depletion, should improve with IV fluids  Thrombocytosis -Due to above, will keep on DVT prophylaxis    DVT Prophylaxis Heparin   AM Labs Ordered, also please review Full Orders  Family Communication: Admission, patients condition and plan of care including tests being ordered have been discussed with the patient and Daughter at bedside who indicate understanding and agree with the plan and Code Status.  Code Status Full  Likely DC to  home  Condition GUARDED    Consults called: General surgery    Admission status: inpatient    Time spent in minutes : 70 minutes   Phillips Climes M.D on 12/25/2021 at 4:51 PM   [facility] - Office  [phone]

## 2021-12-25 NOTE — Anesthesia Postprocedure Evaluation (Signed)
Anesthesia Post Note  Patient: [name]  Procedure(s) Performed: [place] INCISIONAL  Patient location during evaluation: Phase II Anesthesia Type: General Level of consciousness: awake Pain management: pain level controlled Vital Signs Assessment: post-procedure vital signs reviewed and stable Respiratory status: spontaneous breathing and respiratory function stable Cardiovascular status: blood pressure returned to baseline and stable Postop Assessment: no headache and no apparent nausea or vomiting Anesthetic complications: no Comments: Late entry   No notable events documented.   Last Vitals:  Vitals:   12/25/21 1915 12/25/21 1941  BP: (!) 188/79 124/87  Pulse: 86 92  Resp: 13 (!) 24  Temp:  36.6 C  SpO2: 94% 95%    Last Pain:  Vitals:   12/25/21 1941  TempSrc: Oral  PainSc:                  Louann Sjogren

## 2021-12-25 NOTE — Op Note (Signed)
Preoperative diagnosis: RLQ incisional hernia  Postoperative diagnosis: same, incarcerated preperitoneal fat, no bowel, no strangulation  Procedure:  Open incisional hernia repair with medium bard plug and patch mesh  Anesthesia: General  Surgeon: Dr. Lysle Pearl  Wound Classification: Clean  Specimen: none  Complications: None  Estimated Blood Loss: 20m   Indications:  Patient is a 76y.o. female developed a symptomatic RLQ incisional hernia, clinical exam concerning for strangulation due to leukocytosis, degree of pain, and CT report showing bowel involvement.   Findings: 1. Fisful size hernia sac and preperitoneal fat contents herniating all the way through external oblique.  Bowel contents were not visualized within the hernia contents at the time of repair. defect size measuring 2 cm x 1 cm. 2.  Bard plug and patch mesh used to repair  3. Adequate hemostasis achieved  Description of procedure: The patient was taken to the operating room. A time-out was completed verifying correct patient, procedure, site, positioning, and implant(s) and/or special equipment prior to beginning this procedure. The right lower quadrant and groin was prepped and draped in the usual sterile fashion. An incision was marked extending from the previously visible incision scar. The skin crease incision was made with a knife and deepened through Scarpa's and Camper's fascia with electrocautery until the aponeurosis of the external oblique was encountered.  Atop the external oblique was preperitoneal fat contents that were distinctly separate from the surrounding fat structures.  This area was isolated until there was noted to be a very narrow hernia neck at the base of the external oblique the hernia defect was slightly extended to allow better visualization of the hernia contents.  Inspection of the hernia contents noted viable preperitoneal fat but no evidence of bowel involvement at the time of operation.  All the  hernia contents were manually reduced with some effort deep to the transversalis fascia.  Due to the size of the defect, and location of it, Bard plug and patch mesh for repair.  Primary repair would not have been adequate due to the extremely attenuated transversalis and external oblique fascia.  Medium size plug was placed through the palpable defect and secured to the surrounding fascia circumferentially using interrupted 0 Ethibond x4.  The patch was then laid atop the plug and secured to the transversalis fascia circumferentially using 0 Ethibond x4.  Exparel infused within the skin and circumferentially around the patch after extensive irrigation.  The external oblique aponeurosis was closed with a running suture of 2-0 Vicryl. Scarpa's fascia was closed with interrupted 3-0 Vicryl.   The skin was closed with a subcuticular stitch of Monocryl 4-0. Dermabond was applied.   The patient tolerated the procedure well and was taken to the postanesthesia care unit in stable condition. Sponge and instrument count correct at end of procedure.

## 2021-12-26 DIAGNOSIS — R251 Tremor, unspecified: Secondary | ICD-10-CM

## 2021-12-26 LAB — CBC
HCT: 50.9 % — ABNORMAL HIGH (ref 36.0–46.0)
Hemoglobin: 16 g/dL — ABNORMAL HIGH (ref 12.0–15.0)
MCH: 26.8 pg (ref 26.0–34.0)
MCHC: 31.4 g/dL (ref 30.0–36.0)
MCV: 85.3 fL (ref 80.0–100.0)
Platelets: 442 10*3/uL — ABNORMAL HIGH (ref 150–400)
RBC: 5.97 MIL/uL — ABNORMAL HIGH (ref 3.87–5.11)
RDW: 20.5 % — ABNORMAL HIGH (ref 11.5–15.5)
WBC: 17 10*3/uL — ABNORMAL HIGH (ref 4.0–10.5)
nRBC: 0 % (ref 0.0–0.2)

## 2021-12-26 LAB — COMPREHENSIVE METABOLIC PANEL
ALT: 10 U/L (ref 0–44)
AST: 15 U/L (ref 15–41)
Albumin: 3.4 g/dL — ABNORMAL LOW (ref 3.5–5.0)
Alkaline Phosphatase: 63 U/L (ref 38–126)
Anion gap: 8 (ref 5–15)
BUN: 26 mg/dL — ABNORMAL HIGH (ref 8–23)
CO2: 22 mmol/L (ref 22–32)
Calcium: 8.2 mg/dL — ABNORMAL LOW (ref 8.9–10.3)
Chloride: 111 mmol/L (ref 98–111)
Creatinine, Ser: 1.42 mg/dL — ABNORMAL HIGH (ref 0.44–1.00)
GFR, Estimated: 38 mL/min — ABNORMAL LOW (ref >60)
Glucose, Bld: 163 mg/dL — ABNORMAL HIGH (ref 70–99)
Potassium: 3.3 mmol/L — ABNORMAL LOW (ref 3.5–5.1)
Sodium: 141 mmol/L (ref 135–145)
Total Bilirubin: 0.7 mg/dL (ref 0.3–1.2)
Total Protein: 6.4 g/dL — ABNORMAL LOW (ref 6.5–8.1)

## 2021-12-26 LAB — GLUCOSE, CAPILLARY
Glucose-Capillary: 141 mg/dL — ABNORMAL HIGH (ref 70–99)
Glucose-Capillary: 148 mg/dL — ABNORMAL HIGH (ref 70–99)
Glucose-Capillary: 170 mg/dL — ABNORMAL HIGH (ref 70–99)
Glucose-Capillary: 133 mg/dL — ABNORMAL HIGH (ref 70–99)
Glucose-Capillary: 196 mg/dL — ABNORMAL HIGH (ref 70–99)

## 2021-12-26 LAB — MAGNESIUM: Magnesium: 1.8 mg/dL (ref 1.7–2.4)

## 2021-12-26 LAB — MRSA NEXT GEN BY PCR, NASAL: MRSA by PCR Next Gen: NOT DETECTED

## 2021-12-26 MED ORDER — CHLORHEXIDINE GLUCONATE CLOTH 2 % EX PADS
6.0000 | MEDICATED_PAD | Freq: Every day | CUTANEOUS | Status: DC
  Administered 2021-12-26 – 2021-12-28 (×3): 6 via TOPICAL

## 2021-12-26 MED ORDER — HYDRALAZINE HCL 20 MG/ML IJ SOLN
15.0000 mg | INTRAMUSCULAR | Status: DC | PRN
  Administered 2021-12-26 (×2): 15 mg via INTRAVENOUS
  Filled 2021-12-26 (×2): qty 1

## 2021-12-26 MED ORDER — METOPROLOL SUCCINATE ER 50 MG PO TB24
50.0000 mg | ORAL_TABLET | Freq: Every day | ORAL | Status: DC
  Administered 2021-12-26 – 2021-12-28 (×3): 50 mg via ORAL
  Filled 2021-12-26 (×3): qty 1

## 2021-12-26 MED ORDER — INSULIN ASPART 100 UNIT/ML IJ SOLN
0.0000 [IU] | Freq: Three times a day (TID) | INTRAMUSCULAR | Status: DC
  Administered 2021-12-26: 1 [IU] via SUBCUTANEOUS
  Administered 2021-12-26 – 2021-12-27 (×2): 2 [IU] via SUBCUTANEOUS
  Administered 2021-12-27: 1 [IU] via SUBCUTANEOUS
  Administered 2021-12-27: 2 [IU] via SUBCUTANEOUS
  Administered 2021-12-28: 3 [IU] via SUBCUTANEOUS
  Administered 2021-12-28 – 2021-12-29 (×3): 2 [IU] via SUBCUTANEOUS
  Administered 2021-12-29: 3 [IU] via SUBCUTANEOUS
  Administered 2021-12-29 – 2021-12-30 (×2): 2 [IU] via SUBCUTANEOUS
  Administered 2021-12-30: 3 [IU] via SUBCUTANEOUS

## 2021-12-26 MED ORDER — POTASSIUM CHLORIDE CRYS ER 20 MEQ PO TBCR
20.0000 meq | EXTENDED_RELEASE_TABLET | Freq: Once | ORAL | Status: AC
  Administered 2021-12-26: 20 meq via ORAL
  Filled 2021-12-26: qty 1

## 2021-12-26 MED ORDER — MAGNESIUM SULFATE 2 GM/50ML IV SOLN
2.0000 g | Freq: Once | INTRAVENOUS | Status: AC
Start: 2021-12-26 — End: 2021-12-26
  Administered 2021-12-26: 2 g via INTRAVENOUS
  Filled 2021-12-26: qty 50

## 2021-12-26 MED ORDER — POTASSIUM CHLORIDE CRYS ER 20 MEQ PO TBCR
40.0000 meq | EXTENDED_RELEASE_TABLET | Freq: Once | ORAL | Status: AC
  Administered 2021-12-26: 40 meq via ORAL
  Filled 2021-12-26: qty 2

## 2021-12-26 MED ORDER — ACETAMINOPHEN 500 MG PO TABS
1000.0000 mg | ORAL_TABLET | Freq: Four times a day (QID) | ORAL | Status: DC
  Administered 2021-12-26 – 2021-12-27 (×4): 1000 mg via ORAL
  Filled 2021-12-26 (×5): qty 2

## 2021-12-26 NOTE — TOC Progression Note (Signed)
Transition of Care [facility]) - Progression Note    Patient Details  Name: [name] MRN: [id number] Date of Birth: 03/10/1946  Transition of Care [facility]) CM/SW Contact  Salome Arnt, [place] Phone Number: 12/26/2021, 9:22 AM  Clinical Narrative:   Transition of Care [facility]) Screening Note   Patient Details  Name: [name] Date of Birth: 08/19/1945   Transition of Care [facility]) CM/SW Contact:    Salome Arnt, [place] Phone Number: 12/26/2021, 9:22 AM    Transition of Care Department [facility]) has reviewed patient and no TOC needs have been identified at this time. We will continue to monitor patient advancement through interdisciplinary progression rounds. If new patient transition needs arise, please place a TOC consult.            Expected Discharge Plan and Services                                                 Social Determinants of Health (SDOH) Interventions    Readmission Risk Interventions     No data to display

## 2021-12-26 NOTE — Progress Notes (Signed)
Pt arrived from PACU ~1940. VSS on 2L [place]. Pt had been hypertensive prior to surgery now normo tensive.  ~ 2100 pt becomes hypertensive again multiple meds given per Dr. Waldron Labs to assist in lowering BP. Pt aslso complains her tremors are worse Klonopin given.  ~0000 pt BP still labile SBP ranges from 160's-180's notified Dr. Earnest Conroy.

## 2021-12-26 NOTE — Progress Notes (Signed)
Pt arrived from ICU via wheelchair ambulates with cane or walker with 1 assist. Oriented to room and call light, patient complains with abdominal pain and headache see MAR. BP Systolic high see flowsheet IV Hydralazine given and tolerated well. Bed in lowest position. Call light within reach. Daughter at bedside. IV magnesium infusing.

## 2021-12-26 NOTE — Progress Notes (Signed)
Subjective:  CC: [name] is a 76 y.o. female  Hospital stay day 1, 1 Day Post-Op open incisional hernia repair for incarcerated incisional hernia  HPI: BP managed by hospitalist.  Pt complains of increasing belching this am, but still passing flatus.  Only tried liquids.  Pain is more localized to incision this am.  States the diffuse abdominal pain preop has resolved.  New onset headache as well.  ROS:  General: Denies weight loss, weight gain, fatigue, fevers, chills, and night sweats. Heart: Denies chest pain, palpitations, racing heart, irregular heartbeat, leg pain or swelling, and decreased activity tolerance. Respiratory: Denies breathing difficulty, shortness of breath, wheezing, cough, and sputum. GI: Denies change in appetite, heartburn, nausea, vomiting, constipation, diarrhea, and blood in stool. GU: Denies difficulty urinating, pain with urinating, urgency, frequency, blood in urine.   Objective:   Temp:  [97.6 F (36.4 C)-98.5 F (36.9 C)] 98.5 F (36.9 C) (08/05 0758) Pulse Rate:  [69-103] 89 (08/05 0600) Resp:  [12-24] 20 (08/05 0600) BP: (124-232)/(31-144) 159/42 (08/05 0600) SpO2:  [90 %-97 %] 95 % (08/05 0600) Weight:  [77.1 kg-81.4 kg] 81.4 kg (08/05 0400)     Height: '5\' 2"'$  (157.5 cm) Weight: 81.4 kg BMI (Calculated): 32.81   Intake/Output this shift:   Intake/Output Summary (Last 24 hours) at 12/26/2021 0905 Last data filed at 12/26/2021 4081 Gross per 24 hour  Intake 882.54 ml  Output 1335 ml  Net -452.46 ml    Constitutional :  alert, cooperative, appears stated age, and no distress  Respiratory:  clear to auscultation bilaterally  Cardiovascular:  regular rate and rhythm  Gastrointestinal: Soft, no guarding, focal TTP right over incision site, no TTP noted in remaining areas . Minimal tympany, and no obvious distention  Skin: Cool and moist. Incision c/d/I.  Some bruising starting to develop in dependent portion, as expected.  Psychiatric: Normal  affect, non-agitated, not confused       LABS:     Latest Ref Rng & Units 12/26/2021    6:41 AM 12/25/2021    1:47 PM 10/16/2013    3:54 PM  CMP  Glucose 70 - 99 mg/dL 163  310  115   BUN 8 - 23 mg/dL 26  33  18   Creatinine 0.44 - 1.00 mg/dL 1.42  1.62  1.10   Sodium 135 - 145 mmol/L 141  138  143   Potassium 3.5 - 5.1 mmol/L 3.3  3.8  4.2   Chloride 98 - 111 mmol/L 111  105  106   CO2 22 - 32 mmol/L '22  24  29   '$ Calcium 8.9 - 10.3 mg/dL 8.2  8.6  9.9   Total Protein 6.5 - 8.1 g/dL 6.4  7.7  6.5   Total Bilirubin 0.3 - 1.2 mg/dL 0.7  0.5  0.4   Alkaline Phos 38 - 126 U/L 63  77  64   AST 15 - 41 U/L '15  19  25   '$ ALT 0 - 44 U/L '10  12  25       '$ Latest Ref Rng & Units 12/26/2021    6:41 AM 12/25/2021    1:47 PM 10/16/2013    3:49 PM  CBC  WBC 4.0 - 10.5 K/uL 17.0  11.8  9.3   Hemoglobin 12.0 - 15.0 g/dL 16.0  17.1  13.3   Hematocrit 36.0 - 46.0 % 50.9  53.7  38.5   Platelets 150 - 400 K/uL 442  428  280  RADS: N/a Assessment:   S/p open incisional hernia repair for incarcerated incisional hernia.  Continue clear liquid diet for now due to increased belching. Passing flatus is encouraging. Will switch to around the clock tylenol for better pain control.  No NSAIDS due to hx of GI bleed. Will reconsider advancing diet again tomorrow am.   Further care of chronic medical issues per hospitalist  labs/images/medications/previous chart entries reviewed personally and relevant changes/updates noted above.

## 2021-12-26 NOTE — Plan of Care (Signed)
  Problem: Acute Rehab PT Goals(only PT should resolve) Goal: Pt will Roll Supine to Side Outcome: Progressing Flowsheets (Taken 12/26/2021 1201) Pt will Roll Supine to Side: with modified independence Goal: Pt Will Go Supine/Side To Sit Outcome: Progressing Flowsheets (Taken 12/26/2021 1201) Pt will go Supine/Side to Sit: with modified independence Goal: Patient Will Transfer Sit To/From Stand Outcome: Progressing Flowsheets (Taken 12/26/2021 1201) Patient will transfer sit to/from stand: with modified independence Goal: Pt Will Transfer Bed To Chair/Chair To Bed Outcome: Progressing Flowsheets (Taken 12/26/2021 1201) Pt will Transfer Bed to Chair/Chair to Bed: with modified independence Goal: Pt Will Ambulate Outcome: Progressing Flowsheets (Taken 12/26/2021 1201) Pt will Ambulate:  75 feet  with supervision  with modified independence  with rolling walker   12:02 PM, 12/26/21 Lonell Grandchild, MPT Physical Therapist with [facility] [phone] office [phone] mobile phone

## 2021-12-26 NOTE — Progress Notes (Signed)
Attempted to receive report nurse in room unavailable at this time.

## 2021-12-26 NOTE — Evaluation (Signed)
Physical Therapy Evaluation Patient Details Name: WAHNETA DEROCHER MRN: [id number] DOB: 10/06/1945 Today's Date: 12/26/2021  History of Present Illness  Gillian Meeuwsen  is a 76 y.o. female, s/p Open incisional hernia repair with medium bard plug and patch mesh on 12/25/21 with past medical history of hypertension, diabetes mellitus, neuropathy, GI bleed, TIA, diverticulosis, irritable bowel syndrome patient presents to ED secondary to complaints of abdominal pain.  -Reports abdominal pain x2 weeks, report diffuse abdominal pain, seen by her PCP recently where she was given Augmentin for presumed diverticulitis, initially improved, but then got worse, she had CT abdomen pelvis a week ago, which was unremarkable, and was sent today by her PCP due to worsening abdominal pain, she reports diarrhea that started to develop today, reports nausea, she had an episode of vomiting while in ED, denies fever, chills, and urinary symptoms, no chest pain or shortness of breath.   Clinical Impression  Patient presents with trembling of BUE which is baseline per patient, demonstrates fair/good return for rolling to side and sitting up from side lying position, transferring to/from chair/commode and ambulating in room/hallway without loss of balance.  Patient limited for gait training mostly due to fatigue and tolerated sitting up in chair with her daughter present in room after therapy - RN notified.  Patient will benefit from continued skilled physical therapy in hospital and recommended venue below to increase strength, balance, endurance for safe ADLs and gait.        Recommendations for follow up therapy are one component of a multi-disciplinary discharge planning process, led by the attending physician.  Recommendations may be updated based on patient status, additional functional criteria and insurance authorization.  Follow Up Recommendations Home health PT      Assistance Recommended at Discharge Set up  Supervision/Assistance  Patient can return home with the following  A little help with walking and/or transfers;A little help with bathing/dressing/bathroom;Help with stairs or ramp for entrance;Assistance with cooking/housework    Equipment Recommendations None recommended by PT  Recommendations for Other Services       Functional Status Assessment Patient has had a recent decline in their functional status and demonstrates the ability to make significant improvements in function in a reasonable and predictable amount of time.     Precautions / Restrictions Precautions Precautions: Fall Restrictions Weight Bearing Restrictions: No      Mobility  Bed Mobility Overal bed mobility: Needs Assistance Bed Mobility: Rolling, Sidelying to Sit Rolling: Supervision Sidelying to sit: Supervision       General bed mobility comments: increased time with labored movement    Transfers Overall transfer level: Needs assistance Equipment used: Rolling walker (2 wheels) Transfers: Sit to/from Stand, Bed to chair/wheelchair/BSC Sit to Stand: Supervision, Min guard   Step pivot transfers: Supervision, Min guard       General transfer comment: demonstrates good return for transferring to/from chair and commode    Ambulation/Gait Ambulation/Gait assistance: Min guard Gait Distance (Feet): 40 Feet Assistive device: Rolling walker (2 wheels) Gait Pattern/deviations: Decreased step length - right, Decreased step length - left, Decreased stride length Gait velocity: decreased     General Gait Details: required hand held assist when attempting to ambulate with SPC, required use of RW for safety demonstrating slow labored cadence without loss of balance  Stairs            Wheelchair Mobility    Modified Rankin (Stroke Patients Only)       Balance Overall balance assessment: Needs assistance  Sitting-balance support: Feet supported, No upper extremity supported Sitting  balance-Leahy Scale: Good Sitting balance - Comments: seated at EOB   Standing balance support: During functional activity, Single extremity supported Standing balance-Leahy Scale: Poor Standing balance comment: fair/poor using SPC, fair/good using RW                             Pertinent Vitals/Pain Pain Assessment Pain Assessment: Faces Faces Pain Scale: Hurts little more Pain Location: headache and right lower stomach Pain Descriptors / Indicators: Aching, Sore Pain Intervention(s): Limited activity within patient's tolerance, Monitored during session, Repositioned    Home Living Family/patient expects to be discharged to:: Private residence Living Arrangements: Spouse/significant other Available Help at Discharge: Family;Available 24 hours/day Type of Home: House Home Access: Level entry       Home Layout: One level;Laundry or work area in basement;Other (Comment) (Patient states she does not walk on steps to basement, enters at level entry outside) Home Equipment: Conservation officer, nature (2 wheels);Cane - single point;Shower seat;Grab bars - tub/shower      Prior Function Prior Level of Function : Independent/Modified Independent             Mobility Comments: Household and short distanced community ambulator using SPC, leans on shopping carts for support when shopping, leans on furniture in home ADLs Comments: Independent     Hand Dominance        Extremity/Trunk Assessment   Upper Extremity Assessment Upper Extremity Assessment: Overall WFL for tasks assessed    Lower Extremity Assessment Lower Extremity Assessment: Generalized weakness    Cervical / Trunk Assessment Cervical / Trunk Assessment: Kyphotic  Communication   Communication: No difficulties  Cognition Arousal/Alertness: Awake/alert Behavior During Therapy: WFL for tasks assessed/performed Overall Cognitive Status: Within Functional Limits for tasks assessed                                           General Comments      Exercises     Assessment/Plan    PT Assessment Patient needs continued PT services  PT Problem List Decreased strength;Decreased activity tolerance;Decreased balance       PT Treatment Interventions DME instruction;Gait training;Stair training;Functional mobility training;Therapeutic activities;Therapeutic exercise;Patient/family education;Balance training    PT Goals (Current goals can be found in the Care Plan section)  Acute Rehab PT Goals Patient Stated Goal: return home with family to assist PT Goal Formulation: With patient/family Time For Goal Achievement: 12/30/21 Potential to Achieve Goals: Good    Frequency Min 3X/week     Co-evaluation               AM-PAC PT "6 Clicks" Mobility  Outcome Measure Help needed turning from your back to your side while in a flat bed without using bedrails?: None Help needed moving from lying on your back to sitting on the side of a flat bed without using bedrails?: A Little Help needed moving to and from a bed to a chair (including a wheelchair)?: A Little Help needed standing up from a chair using your arms (e.g., wheelchair or bedside chair)?: A Little Help needed to walk in hospital room?: A Little Help needed climbing 3-5 steps with a railing? : A Little 6 Click Score: 19    End of Session   Activity Tolerance: Patient tolerated treatment well;Patient limited by fatigue Patient  left: in chair;with call bell/phone within reach;with family/visitor present Nurse Communication: Mobility status PT Visit Diagnosis: Unsteadiness on feet (R26.81);Other abnormalities of gait and mobility (R26.89);Muscle weakness (generalized) (M62.81)    Time: 3500-9381 PT Time Calculation (min) (ACUTE ONLY): 32 min   Charges:   PT Evaluation $PT Eval Moderate Complexity: 1 Mod PT Treatments $Therapeutic Activity: 23-37 mins        11:59 AM, 12/26/21 Lonell Grandchild, MPT Physical  Therapist with [facility] [phone] office [phone] mobile phone

## 2021-12-26 NOTE — Progress Notes (Signed)
PROGRESS NOTE   [name]  LFY:[id number] DOB: 12/27/1945 DOA: 12/25/2021 PCP: Glenda Chroman, MD   Chief Complaint  Patient presents with   Abdominal Pain   Level of care: Telemetry  Brief Admission History:  76 y.o. female, with past medical history of hypertension, diabetes mellitus, neuropathy, GI bleed, TIA, diverticulosis, irritable bowel syndrome patient presents to ED secondary to complaints of abdominal pain. -Reports abdominal pain x2 weeks, report diffuse abdominal pain, seen by her PCP recently where she was given Augmentin for presumed diverticulitis, initially improved, but then got worse, she had CT abdomen pelvis a week ago, which was unremarkable, and was sent today by her PCP due to worsening abdominal pain, she reports diarrhea that started to develop today, reports nausea, she had an episode of vomiting while in ED, denies fever, chills, and urinary symptoms, no chest pain or shortness of breath. - in ED CT abdomen and pelvis showing ventral hernia with, labs were significant for dehydration with creatinine of 1.65, hemoglobin of 17, platelet count of 423 K, white blood cell count elevated at 11.8, patient was seen by general surgery with concern of incarcerated incisional hernia with recommendation for emergent surgery, [facility] hospitalist consulted to admit.   Assessment and Plan: Abdominal pain from incarcerated incisional hernia Post op s/p open incisional hernia repair  -Patient presented with progressive abdominal pain over the last few days. -General surgery input greatly appreciated,  patient be taken emergently to the OR on 12/25/21.   AKI -Due to volume depletion and dehydration, continue with IV fluids and hold nephrotoxic medications   Hypertension, uncontrolled -increased metoprolol to 50 mg, continue diltiazem 180 mg and increased dose of as needed hydralazine   Diabetes mellitus -Will hold Jardiance, glipizide, Actos and metformin, -Will keep an  insulin sliding scale  CBG (last 3)  Recent Labs    12/26/21 0418 12/26/21 0731 12/26/21 1206  GLUCAP 141* 148* 170*    GERD -We will keep on IV Protonix Hyperlipidemia -Resume statin when she improves   Hypothyroidism -Synthroid can be resumed once she is able to take oral, and if she remains n.p.o. for> 72 hours then she can be started on IV Synthroid   Secondary polycythemia -Due to dehydration and volume depletion, should improve with IV fluids   Thrombocytosis -Due to above, will keep on DVT prophylaxis   DVT prophylaxis: sq heparin  Code Status: Full  Family Communication:  Disposition: Status is: Inpatient Remains inpatient appropriate because: intensity    Consultants:  Surgery  Procedures:  Open incisional hernia repair for incarcerated incisional hernia Antimicrobials:    Subjective: Pt reports that she continues to have abdominal pain and now having headaches.   Objective: Vitals:   12/26/21 0600 12/26/21 0758 12/26/21 1027 12/26/21 1218  BP: (!) 159/42  (!) 196/46   Pulse: 89  67   Resp: 20  13   Temp:  98.5 F (36.9 C)  98 F (36.7 C)  TempSrc:  Oral  Oral  SpO2: 95%  95%   Weight:      Height:        Intake/Output Summary (Last 24 hours) at 12/26/2021 1418 Last data filed at 12/26/2021 0640 Gross per 24 hour  Intake 882.54 ml  Output 1335 ml  Net -452.46 ml   Filed Weights   12/25/21 1329 12/25/21 1941 12/26/21 0400  Weight: 77.1 kg 77.4 kg 81.4 kg   Examination:  General exam: Appears calm and comfortable  Respiratory system: Clear to auscultation.  Respiratory effort normal. Cardiovascular system: normal S1 & S2 heard. No JVD, murmurs, rubs, gallops or clicks. No pedal edema. Gastrointestinal system: Abdomen is nondistended, soft and tender to light palpation. Wounds clean, dry and intact.   No organomegaly or masses felt. Normal bowel sounds heard. Central nervous system: Alert and oriented. No focal neurological  deficits. Extremities: Symmetric 5 x 5 power. Skin: No rashes, lesions or ulcers. Psychiatry: Judgement and insight appear normal. Mood & affect appropriate.   Data Reviewed: I have personally reviewed following labs and imaging studies  CBC: Recent Labs  Lab 12/25/21 1347 12/26/21 0641  WBC 11.8* 17.0*  HGB 17.1* 16.0*  HCT 53.7* 50.9*  MCV 85.0 85.3  PLT 428* 442*    Basic Metabolic Panel: Recent Labs  Lab 12/25/21 1347 12/26/21 0641 12/26/21 0643  NA 138 141  --   K 3.8 3.3*  --   CL 105 111  --   CO2 24 22  --   GLUCOSE 310* 163*  --   BUN 33* 26*  --   CREATININE 1.62* 1.42*  --   CALCIUM 8.6* 8.2*  --   MG  --   --  1.8    CBG: Recent Labs  Lab 12/25/21 1947 12/25/21 2316 12/26/21 0418 12/26/21 0731 12/26/21 1206  GLUCAP 192* 159* 141* 148* 170*    Recent Results (from the past 240 hour(s))  MRSA Next Gen by PCR, Nasal     Status: None   Collection Time: 12/25/21  7:43 PM   Specimen: Nasal Mucosa; Nasal Swab  Result Value Ref Range Status   MRSA by PCR Next Gen NOT DETECTED NOT DETECTED Final    Comment: (NOTE) The GeneXpert MRSA Assay (FDA approved for NASAL specimens only), is one component of a comprehensive MRSA colonization surveillance program. It is not intended to diagnose MRSA infection nor to guide or monitor treatment for MRSA infections. Test performance is not FDA approved in patients less than 92 years old. Performed at [facility], [address], [place], [place] [address]      Radiology Studies: CT Abdomen Pelvis W Contrast  Result Date: 12/25/2021 CLINICAL DATA:  Generalized abdominal pain for 2 weeks, urinary retention, nausea EXAM: CT ABDOMEN AND PELVIS WITH CONTRAST TECHNIQUE: Multidetector CT imaging of the abdomen and pelvis was performed using the standard protocol following bolus administration of intravenous contrast. RADIATION DOSE REDUCTION: This exam was performed according to the departmental dose-optimization  program which includes automated exposure control, adjustment of the mA and/or kV according to patient size and/or use of iterative reconstruction technique. CONTRAST:  47m OMNIPAQUE IOHEXOL 300 MG/ML  SOLN COMPARISON:  12/18/2021 FINDINGS: Lower chest: No acute pleural or parenchymal lung disease. Hepatobiliary: No focal liver abnormality is seen. Status post cholecystectomy. No biliary dilatation. Pancreas: Unremarkable. No pancreatic ductal dilatation or surrounding inflammatory changes. Spleen: Normal in size without focal abnormality. Adrenals/Urinary Tract: Numerous stable hypodensities throughout the bilateral kidneys consistent with simple and hemorrhagic cysts. No specific imaging follow-up is required. No urinary tract calculi or obstructive uropathy within either kidney. The adrenals and bladder are unremarkable. Stomach/Bowel: No bowel obstruction or ileus. Stable colonic diverticulosis without evidence of acute diverticulitis. No bowel wall thickening or inflammatory change. Evidence of prior partial sigmoid colon resection and reanastomosis. Vascular/Lymphatic: Aortic atherosclerosis. No enlarged abdominal or pelvic lymph nodes. Reproductive: Status post hysterectomy. No adnexal masses. Other: No free fluid or free intraperitoneal gas. A small fat containing supraumbilical midline ventral hernia is identified, without associated bowel herniation.  There is a second abdominal wall hernia within the right lower quadrant, reference image 61/2. At this location, there is a 1.2 by 2.0 cm abdominal wall defect, which contains herniated mesenteric fat and a small portion of the anti mesenteric wall of the cecum, reference image 62/2. No evidence of incarceration or bowel obstruction. Musculoskeletal: No acute or destructive bony lesions. Reconstructed images demonstrate no additional findings. IMPRESSION: 1. Right lower quadrant ventral hernia, containing herniated mesenteric fat and a small portion of the  anti mesenteric wall the cecum. Please correlate with site of patient discomfort. No evidence of incarceration or bowel obstruction at this time. 2. Small fat containing midline supraumbilical ventral hernia. 3. Diverticulosis without evidence of acute diverticulitis. 4.  Aortic Atherosclerosis (ICD10-I70.0). Electronically Signed   By: Randa Ngo M.D.   On: 12/25/2021 15:27    Scheduled Meds:  acetaminophen  1,000 mg Oral Q6H   Chlorhexidine Gluconate Cloth  6 each Topical Daily   citalopram  10 mg Oral Daily   diltiazem  180 mg Oral Daily   heparin  5,000 Units Subcutaneous Q8H   insulin aspart  0-9 Units Subcutaneous TID WC   levothyroxine  50 mcg Oral Daily   metoprolol succinate  50 mg Oral Daily   Continuous Infusions:  sodium chloride 30 mL/hr at 12/26/21 1020   magnesium sulfate bolus IVPB 2 g (12/26/21 1406)     LOS: 1 day   Time spent: 35 mins  Rael Yo Wynetta Emery, MD How to contact the [facility] Attending or Consulting provider [place] or covering provider during after hours [place], for this patient?  Check the care team in [facility] and look for a) attending/consulting [facility] provider listed and b) the [facility] team listed Log into [email or web address] and use [employer] universal password to access. If you do not have the password, please contact the hospital operator. Locate the [facility] provider you are looking for under [facility] and page to a number that you can be directly reached. If you still have difficulty reaching the provider, please page the [facility] (Director on Call) for the Hospitalists listed on amion for assistance.  12/26/2021, 2:18 PM

## 2021-12-27 ENCOUNTER — Inpatient Hospital Stay (HOSPITAL_COMMUNITY): Payer: PPO

## 2021-12-27 LAB — CBC WITH DIFFERENTIAL/PLATELET
Abs Immature Granulocytes: 0.17 10*3/uL — ABNORMAL HIGH (ref 0.00–0.07)
Basophils Absolute: 0.1 10*3/uL (ref 0.0–0.1)
Basophils Relative: 1 %
Eosinophils Absolute: 0.2 10*3/uL (ref 0.0–0.5)
Eosinophils Relative: 1 %
HCT: 50.5 % — ABNORMAL HIGH (ref 36.0–46.0)
Hemoglobin: 15.6 g/dL — ABNORMAL HIGH (ref 12.0–15.0)
Immature Granulocytes: 1 %
Lymphocytes Relative: 7 %
Lymphs Abs: 0.9 10*3/uL (ref 0.7–4.0)
MCH: 26.9 pg (ref 26.0–34.0)
MCHC: 30.9 g/dL (ref 30.0–36.0)
MCV: 87.1 fL (ref 80.0–100.0)
Monocytes Absolute: 0.9 10*3/uL (ref 0.1–1.0)
Monocytes Relative: 7 %
Neutro Abs: 11 10*3/uL — ABNORMAL HIGH (ref 1.7–7.7)
Neutrophils Relative %: 83 %
Platelets: 376 10*3/uL (ref 150–400)
RBC: 5.8 MIL/uL — ABNORMAL HIGH (ref 3.87–5.11)
RDW: 21.2 % — ABNORMAL HIGH (ref 11.5–15.5)
WBC: 13.2 10*3/uL — ABNORMAL HIGH (ref 4.0–10.5)
nRBC: 0 % (ref 0.0–0.2)

## 2021-12-27 LAB — BASIC METABOLIC PANEL
Anion gap: 7 (ref 5–15)
BUN: 23 mg/dL (ref 8–23)
CO2: 22 mmol/L (ref 22–32)
Calcium: 8.4 mg/dL — ABNORMAL LOW (ref 8.9–10.3)
Chloride: 109 mmol/L (ref 98–111)
Creatinine, Ser: 1.52 mg/dL — ABNORMAL HIGH (ref 0.44–1.00)
GFR, Estimated: 35 mL/min — ABNORMAL LOW (ref >60)
Glucose, Bld: 123 mg/dL — ABNORMAL HIGH (ref 70–99)
Potassium: 4.5 mmol/L (ref 3.5–5.1)
Sodium: 138 mmol/L (ref 135–145)

## 2021-12-27 LAB — GLUCOSE, CAPILLARY
Glucose-Capillary: 181 mg/dL — ABNORMAL HIGH (ref 70–99)
Glucose-Capillary: 164 mg/dL — ABNORMAL HIGH (ref 70–99)
Glucose-Capillary: 142 mg/dL — ABNORMAL HIGH (ref 70–99)
Glucose-Capillary: 180 mg/dL — ABNORMAL HIGH (ref 70–99)

## 2021-12-27 LAB — MAGNESIUM: Magnesium: 2.4 mg/dL (ref 1.7–2.4)

## 2021-12-27 MED ORDER — PRAVASTATIN SODIUM 10 MG PO TABS
20.0000 mg | ORAL_TABLET | Freq: Every day | ORAL | Status: DC
  Administered 2021-12-27 – 2021-12-28 (×2): 20 mg via ORAL
  Filled 2021-12-27: qty 2

## 2021-12-27 MED ORDER — HYDRALAZINE HCL 25 MG PO TABS
50.0000 mg | ORAL_TABLET | Freq: Three times a day (TID) | ORAL | Status: DC
  Administered 2021-12-27 – 2021-12-29 (×7): 50 mg via ORAL
  Filled 2021-12-27 (×7): qty 2

## 2021-12-27 MED ORDER — PANTOPRAZOLE SODIUM 40 MG PO TBEC
40.0000 mg | DELAYED_RELEASE_TABLET | Freq: Every day | ORAL | Status: DC
  Administered 2021-12-27 – 2021-12-30 (×4): 40 mg via ORAL
  Filled 2021-12-27 (×4): qty 1

## 2021-12-27 MED ORDER — HYDROCHLOROTHIAZIDE 12.5 MG PO TABS
12.5000 mg | ORAL_TABLET | Freq: Every day | ORAL | Status: DC
  Administered 2021-12-27 – 2021-12-30 (×4): 12.5 mg via ORAL
  Filled 2021-12-27 (×4): qty 1

## 2021-12-27 MED ORDER — LISINOPRIL 10 MG PO TABS
20.0000 mg | ORAL_TABLET | Freq: Every day | ORAL | Status: DC
  Administered 2021-12-27 – 2021-12-30 (×4): 20 mg via ORAL
  Filled 2021-12-27 (×4): qty 2

## 2021-12-27 MED ORDER — GABAPENTIN 100 MG PO CAPS
100.0000 mg | ORAL_CAPSULE | Freq: Every day | ORAL | Status: DC
  Administered 2021-12-27 – 2021-12-29 (×3): 100 mg via ORAL
  Filled 2021-12-27 (×3): qty 1

## 2021-12-27 MED ORDER — LISINOPRIL-HYDROCHLOROTHIAZIDE 20-12.5 MG PO TABS: 1.0000 | ORAL_TABLET | Freq: Every day | ORAL | Status: DC

## 2021-12-27 MED ORDER — PROMETHAZINE HCL 12.5 MG PO TABS
12.5000 mg | ORAL_TABLET | Freq: Four times a day (QID) | ORAL | Status: DC | PRN
  Administered 2021-12-27: 12.5 mg via ORAL
  Filled 2021-12-27: qty 1

## 2021-12-27 MED ORDER — BUTALBITAL-APAP-CAFFEINE 50-325-40 MG PO TABS
1.0000 | ORAL_TABLET | ORAL | Status: DC | PRN
  Administered 2021-12-27 – 2021-12-29 (×5): 1 via ORAL
  Filled 2021-12-27 (×5): qty 1

## 2021-12-27 MED ORDER — SIMETHICONE 80 MG PO CHEW
80.0000 mg | CHEWABLE_TABLET | Freq: Four times a day (QID) | ORAL | Status: DC
  Administered 2021-12-27 – 2021-12-30 (×12): 80 mg via ORAL
  Filled 2021-12-27 (×12): qty 1

## 2021-12-27 NOTE — Progress Notes (Signed)
Received call from tele @ 0045 stating the pt was off monitor.  Entered pt room, pt awake ( had been observed sleeping less than 15 minutes prior).  Tele completely removed from pt. Pt had also pulled out IV.  Pt states, "I woke up tangled up in everything."  Several attempts by 3 nurses including use of [place] machine to reinsert IV without success.  Provider notified that pt has no IV access but is also not scheduled for IV medications other than current NS @ 30 ml/hr, will have next shift attempt.

## 2021-12-27 NOTE — Progress Notes (Signed)
PROGRESS NOTE   OLA RAAP  GYF:[id number] DOB: September 07, 1945 DOA: 12/25/2021 PCP: Glenda Chroman, MD   Chief Complaint  Patient presents with   Abdominal Pain   Level of care: Med-Surg  Brief Admission History:  76 y.o. female, with past medical history of hypertension, diabetes mellitus, neuropathy, GI bleed, TIA, diverticulosis, irritable bowel syndrome patient presents to ED secondary to complaints of abdominal pain. -Reports abdominal pain x2 weeks, report diffuse abdominal pain, seen by her PCP recently where she was given Augmentin for presumed diverticulitis, initially improved, but then got worse, she had CT abdomen pelvis a week ago, which was unremarkable, and was sent today by her PCP due to worsening abdominal pain, she reports diarrhea that started to develop today, reports nausea, she had an episode of vomiting while in ED, denies fever, chills, and urinary symptoms, no chest pain or shortness of breath. - in ED CT abdomen and pelvis showing ventral hernia with, labs were significant for dehydration with creatinine of 1.65, hemoglobin of 17, platelet count of 423 K, white blood cell count elevated at 11.8, patient was seen by general surgery with concern of incarcerated incisional hernia with recommendation for emergent surgery, [facility] hospitalist consulted to admit.   Assessment and Plan: Abdominal pain from incarcerated incisional hernia Post op s/p open incisional hernia repair  -Patient presented with progressive abdominal pain over the last few days. -General surgery input greatly appreciated,  patient be taken emergently to the OR on 12/25/21.   AKI -Due to volume depletion and dehydration, continue with IV fluids and hold nephrotoxic medications  Headaches - likely secondary to elevated BPs - prn meds ordered, working on improving BP   Hypertension, uncontrolled -increased metoprolol to 50 mg, continue diltiazem 180 mg and increased dose of as needed hydralazine,  resuming home zestoretic and added hyralazine 50 mg TID    Diabetes mellitus -Will hold Jardiance, glipizide, Actos and metformin, -Will keep an insulin sliding scale  CBG (last 3)  Recent Labs    12/26/21 2053 12/27/21 0753 12/27/21 1139  GLUCAP 196* 181* 164*    GERD -Protonix 40 mg daily  Hyperlipidemia -Resume pravastatin   Hypothyroidism -resume home levothyroxine   Secondary polycythemia -recheck CBC in AM    Thrombocytosis -Due to above, will keep on DVT prophylaxis   DVT prophylaxis: sq heparin  Code Status: Full  Family Communication:  Disposition: Status is: Inpatient Remains inpatient appropriate because: intensity    Consultants:  Surgery  Procedures:  Open incisional hernia repair for incarcerated incisional hernia Antimicrobials:    Subjective: Pt having some nauseas and having headaches.    Objective: Vitals:   12/27/21 0219 12/27/21 0244 12/27/21 0505 12/27/21 1001  BP: (!) 195/178 (!) 150/51 (!) 164/64 (!) 184/66  Pulse: 65  67 73  Resp: 18  18   Temp: 98.7 F (37.1 C)  98.4 F (36.9 C)   TempSrc: Oral  Oral   SpO2: (!) 88% 94% 93%   Weight:      Height:        Intake/Output Summary (Last 24 hours) at 12/27/2021 1158 Last data filed at 12/27/2021 0908 Gross per 24 hour  Intake 900.58 ml  Output 50 ml  Net 850.58 ml   Filed Weights   12/25/21 1329 12/25/21 1941 12/26/21 0400  Weight: 77.1 kg 77.4 kg 81.4 kg   Examination:  General exam: Appears calm and comfortable  Respiratory system: Clear to auscultation. Respiratory effort normal. Cardiovascular system: normal S1 & S2 heard.  No JVD, murmurs, rubs, gallops or clicks. No pedal edema. Gastrointestinal system: Abdomen is nondistended, soft and tender to light palpation. Wounds clean, dry and intact.   No organomegaly or masses felt. Normal bowel sounds heard. Central nervous system: Alert and oriented. No focal neurological deficits. Extremities: Symmetric 5 x 5 power. Skin: No  rashes, lesions or ulcers. Psychiatry: Judgement and insight appear normal. Mood & affect appropriate.   Data Reviewed: I have personally reviewed following labs and imaging studies  CBC: Recent Labs  Lab 12/25/21 1347 12/26/21 0641 12/27/21 0412  WBC 11.8* 17.0* 13.2*  NEUTROABS  --   --  11.0*  HGB 17.1* 16.0* 15.6*  HCT 53.7* 50.9* 50.5*  MCV 85.0 85.3 87.1  PLT 428* 442* 683    Basic Metabolic Panel: Recent Labs  Lab 12/25/21 1347 12/26/21 0641 12/26/21 0643 12/27/21 0412  NA 138 141  --  138  K 3.8 3.3*  --  4.5  CL 105 111  --  109  CO2 24 22  --  22  GLUCOSE 310* 163*  --  123*  BUN 33* 26*  --  23  CREATININE 1.62* 1.42*  --  1.52*  CALCIUM 8.6* 8.2*  --  8.4*  MG  --   --  1.8 2.4    CBG: Recent Labs  Lab 12/26/21 1206 12/26/21 1636 12/26/21 2053 12/27/21 0753 12/27/21 1139  GLUCAP 170* 133* 196* 181* 164*    Recent Results (from the past 240 hour(s))  MRSA Next Gen by PCR, Nasal     Status: None   Collection Time: 12/25/21  7:43 PM   Specimen: Nasal Mucosa; Nasal Swab  Result Value Ref Range Status   MRSA by PCR Next Gen NOT DETECTED NOT DETECTED Final    Comment: (NOTE) The GeneXpert MRSA Assay (FDA approved for NASAL specimens only), is one component of a comprehensive MRSA colonization surveillance program. It is not intended to diagnose MRSA infection nor to guide or monitor treatment for MRSA infections. Test performance is not FDA approved in patients less than 45 years old. Performed at [facility], [address] [place], [place] [address]      Radiology Studies: CT Abdomen Pelvis W Contrast  Result Date: 12/25/2021 CLINICAL DATA:  Generalized abdominal pain for 2 weeks, urinary retention, nausea EXAM: CT ABDOMEN AND PELVIS WITH CONTRAST TECHNIQUE: Multidetector CT imaging of the abdomen and pelvis was performed using the standard protocol following bolus administration of intravenous contrast. RADIATION DOSE REDUCTION: This exam  was performed according to the departmental dose-optimization program which includes automated exposure control, adjustment of the mA and/or kV according to patient size and/or use of iterative reconstruction technique. CONTRAST:  64m OMNIPAQUE IOHEXOL 300 MG/ML  SOLN COMPARISON:  12/18/2021 FINDINGS: Lower chest: No acute pleural or parenchymal lung disease. Hepatobiliary: No focal liver abnormality is seen. Status post cholecystectomy. No biliary dilatation. Pancreas: Unremarkable. No pancreatic ductal dilatation or surrounding inflammatory changes. Spleen: Normal in size without focal abnormality. Adrenals/Urinary Tract: Numerous stable hypodensities throughout the bilateral kidneys consistent with simple and hemorrhagic cysts. No specific imaging follow-up is required. No urinary tract calculi or obstructive uropathy within either kidney. The adrenals and bladder are unremarkable. Stomach/Bowel: No bowel obstruction or ileus. Stable colonic diverticulosis without evidence of acute diverticulitis. No bowel wall thickening or inflammatory change. Evidence of prior partial sigmoid colon resection and reanastomosis. Vascular/Lymphatic: Aortic atherosclerosis. No enlarged abdominal or pelvic lymph nodes. Reproductive: Status post hysterectomy. No adnexal masses. Other: No free fluid or  free intraperitoneal gas. A small fat containing supraumbilical midline ventral hernia is identified, without associated bowel herniation. There is a second abdominal wall hernia within the right lower quadrant, reference image 61/2. At this location, there is a 1.2 by 2.0 cm abdominal wall defect, which contains herniated mesenteric fat and a small portion of the anti mesenteric wall of the cecum, reference image 62/2. No evidence of incarceration or bowel obstruction. Musculoskeletal: No acute or destructive bony lesions. Reconstructed images demonstrate no additional findings. IMPRESSION: 1. Right lower quadrant ventral hernia,  containing herniated mesenteric fat and a small portion of the anti mesenteric wall the cecum. Please correlate with site of patient discomfort. No evidence of incarceration or bowel obstruction at this time. 2. Small fat containing midline supraumbilical ventral hernia. 3. Diverticulosis without evidence of acute diverticulitis. 4.  Aortic Atherosclerosis (ICD10-I70.0). Electronically Signed   By: Randa Ngo M.D.   On: 12/25/2021 15:27    Scheduled Meds:  Chlorhexidine Gluconate Cloth  6 each Topical Daily   citalopram  10 mg Oral Daily   diltiazem  180 mg Oral Daily   heparin  5,000 Units Subcutaneous Q8H   hydrALAZINE  50 mg Oral Q8H   insulin aspart  0-9 Units Subcutaneous TID WC   levothyroxine  50 mcg Oral Daily   metoprolol succinate  50 mg Oral Daily   simethicone  80 mg Oral QID   Continuous Infusions:   LOS: 2 days   Time spent: 35 mins  Miranda Frese Wynetta Emery, MD How to contact the [facility] Attending or Consulting provider [place] or covering provider during after hours [place], for this patient?  Check the care team in [facility] and look for a) attending/consulting TRH provider listed and b) the [facility] team listed Log into [email or web address] and use [place]'s universal password to access. If you do not have the password, please contact the hospital operator. Locate the [facility] provider you are looking for under [facility] and page to a number that you can be directly reached. If you still have difficulty reaching the provider, please page the [facility] (Director on Call) for the Hospitalists listed on amion for assistance.  12/27/2021, 11:58 AM

## 2021-12-27 NOTE — Progress Notes (Signed)
Subjective:  CC: [name] is a 76 y.o. female  Hospital stay day 2, 2 Days Post-Op open incisional hernia repair for incarcerated incisional hernia  HPI: Main complaint is persistent headaches despite use of Tylenol and tramadol.  Still having belching and had an episode of nausea after smelling the chicken broth.  Despite all this, she states she is still passing flatus, overall pain in the abdomen is improving.   ROS:  General: Denies weight loss, weight gain, fatigue, fevers, chills, and night sweats. Heart: Denies chest pain, palpitations, racing heart, irregular heartbeat, leg pain or swelling, and decreased activity tolerance. Respiratory: Denies breathing difficulty, shortness of breath, wheezing, cough, and sputum. GI: Denies change in appetite, heartburn, nausea, vomiting, constipation, diarrhea, and blood in stool. GU: Denies difficulty urinating, pain with urinating, urgency, frequency, blood in urine.   Objective:   Temp:  [98 F (36.7 C)-98.7 F (37.1 C)] 98.4 F (36.9 C) (08/06 0505) Pulse Rate:  [59-75] 73 (08/06 1001) Resp:  [13-20] 18 (08/06 0505) BP: (142-196)/(46-178) 184/66 (08/06 1001) SpO2:  [88 %-96 %] 93 % (08/06 0505)     Height: '5\' 2"'$  (157.5 cm) Weight: 81.4 kg BMI (Calculated): 32.81   Intake/Output this shift:   Intake/Output Summary (Last 24 hours) at 12/27/2021 1006 Last data filed at 12/27/2021 0908 Gross per 24 hour  Intake 900.58 ml  Output 50 ml  Net 850.58 ml    Constitutional :  alert, cooperative, appears stated age, and no distress  Respiratory:  clear to auscultation bilaterally  Cardiovascular:  regular rate and rhythm  Gastrointestinal: Soft, no guarding, focal TTP right over incision site, no TTP noted in remaining areas . Minimal tympany, and no obvious distention  Skin: Cool and moist. Incision c/d/I.  Increased bruising starting to develop in dependent portion, as expected.  Psychiatric: Normal affect, non-agitated, not  confused       LABS:     Latest Ref Rng & Units 12/27/2021    4:12 AM 12/26/2021    6:41 AM 12/25/2021    1:47 PM  CMP  Glucose 70 - 99 mg/dL 123  163  310   BUN 8 - 23 mg/dL 23  26  33   Creatinine 0.44 - 1.00 mg/dL 1.52  1.42  1.62   Sodium 135 - 145 mmol/L 138  141  138   Potassium 3.5 - 5.1 mmol/L 4.5  3.3  3.8   Chloride 98 - 111 mmol/L 109  111  105   CO2 22 - 32 mmol/L '22  22  24   '$ Calcium 8.9 - 10.3 mg/dL 8.4  8.2  8.6   Total Protein 6.5 - 8.1 g/dL  6.4  7.7   Total Bilirubin 0.3 - 1.2 mg/dL  0.7  0.5   Alkaline Phos 38 - 126 U/L  63  77   AST 15 - 41 U/L  15  19   ALT 0 - 44 U/L  10  12       Latest Ref Rng & Units 12/27/2021    4:12 AM 12/26/2021    6:41 AM 12/25/2021    1:47 PM  CBC  WBC 4.0 - 10.5 K/uL 13.2  17.0  11.8   Hemoglobin 12.0 - 15.0 g/dL 15.6  16.0  17.1   Hematocrit 36.0 - 46.0 % 50.5  50.9  53.7   Platelets 150 - 400 K/uL 376  442  428     RADS: N/a Assessment:   S/p open incisional hernia  repair for incarcerated incisional hernia.  We will obtain KUB to make sure she is not developing an ileus.  Hopefully the nausea episodes are related to her persistent headache.  Continue clear liquid diet for now due to increased belching. Passing flatus is still encouraging. No NSAIDS due to hx of GI bleed. Will reconsider advancing diet again tomorrow am.   Further care of chronic medical issues per hospitalist  labs/images/medications/previous chart entries reviewed personally and relevant changes/updates noted above.

## 2021-12-28 ENCOUNTER — Encounter (HOSPITAL_COMMUNITY): Payer: Self-pay | Admitting: Internal Medicine

## 2021-12-28 ENCOUNTER — Inpatient Hospital Stay (HOSPITAL_COMMUNITY): Payer: PPO

## 2021-12-28 LAB — BASIC METABOLIC PANEL
Anion gap: 10 (ref 5–15)
BUN: 24 mg/dL — ABNORMAL HIGH (ref 8–23)
CO2: 20 mmol/L — ABNORMAL LOW (ref 22–32)
Calcium: 8.9 mg/dL (ref 8.9–10.3)
Chloride: 107 mmol/L (ref 98–111)
Creatinine, Ser: 1.55 mg/dL — ABNORMAL HIGH (ref 0.44–1.00)
GFR, Estimated: 34 mL/min — ABNORMAL LOW (ref >60)
Glucose, Bld: 141 mg/dL — ABNORMAL HIGH (ref 70–99)
Potassium: 4.4 mmol/L (ref 3.5–5.1)
Sodium: 137 mmol/L (ref 135–145)

## 2021-12-28 LAB — CBC
HCT: 51.3 % — ABNORMAL HIGH (ref 36.0–46.0)
Hemoglobin: 16.1 g/dL — ABNORMAL HIGH (ref 12.0–15.0)
MCH: 26.8 pg (ref 26.0–34.0)
MCHC: 31.4 g/dL (ref 30.0–36.0)
MCV: 85.4 fL (ref 80.0–100.0)
Platelets: 434 10*3/uL — ABNORMAL HIGH (ref 150–400)
RBC: 6.01 MIL/uL — ABNORMAL HIGH (ref 3.87–5.11)
RDW: 21.2 % — ABNORMAL HIGH (ref 11.5–15.5)
WBC: 16.5 10*3/uL — ABNORMAL HIGH (ref 4.0–10.5)
nRBC: 0 % (ref 0.0–0.2)

## 2021-12-28 LAB — GLUCOSE, CAPILLARY
Glucose-Capillary: 166 mg/dL — ABNORMAL HIGH (ref 70–99)
Glucose-Capillary: 237 mg/dL — ABNORMAL HIGH (ref 70–99)
Glucose-Capillary: 197 mg/dL — ABNORMAL HIGH (ref 70–99)
Glucose-Capillary: 193 mg/dL — ABNORMAL HIGH (ref 70–99)

## 2021-12-28 MED ORDER — GADOBUTROL 1 MMOL/ML IV SOLN
7.0000 mL | Freq: Once | INTRAVENOUS | Status: AC | PRN
  Administered 2021-12-28: 7 mL via INTRAVENOUS

## 2021-12-28 MED ORDER — DILTIAZEM HCL ER COATED BEADS 120 MG PO CP24
240.0000 mg | ORAL_CAPSULE | Freq: Every day | ORAL | Status: DC
  Administered 2021-12-28: 240 mg via ORAL
  Filled 2021-12-28: qty 2

## 2021-12-28 MED ORDER — LORAZEPAM 2 MG/ML IJ SOLN
1.0000 mg | Freq: Once | INTRAMUSCULAR | Status: AC | PRN
  Administered 2021-12-28: 1 mg via INTRAVENOUS
  Filled 2021-12-28: qty 1

## 2021-12-28 NOTE — TOC Initial Note (Signed)
Transition of Care [facility]) - Initial/Assessment Note    Patient Details  Name: [name] MRN: [id number] Date of Birth: 1946/01/28  Transition of [facility]) CM/SW Contact:    Shade Flood, LCSW Phone Number: 12/28/2021, 12:59 PM  Clinical Narrative:                  Pt admitted from home. MD anticipating dc possibly tomorrow. PT recommending HHPT at dc. Met with pt at bedside today to review dc planning. Pt reports that she lives with her husband and her dog and she plans to return home at dc. Pt has a cane for ambulation at home. She states that she drives and is independent in ADLs. Reviewed PT recommendation for HH at dc and pt agreeable. CMS provider options reviewed. Pt agreeable to any [place] agency that will accept her insurance.  Linda at [facility] accepted referral. Information added to pt's AVS. TOC will follow.  Expected Discharge Plan: [place] Barriers to Discharge: Continued Medical Work up   Patient Goals and CMS Choice Patient states their goals for this hospitalization and ongoing recovery are:: go home CMS Medicare.gov Compare Post Acute Care list provided to:: Patient Choice offered to / list presented to : Patient  Expected Discharge Plan and Services Expected Discharge Plan: [place] In-house Referral: Clinical Social Work   Post Acute Care Choice: [place] arrangements for the past 2 months: [place]: PT [place]: [place] ([place]) Date [place]: 12/28/21   Representative spoke with at [place]: Vaughan Basta  Prior Living Arrangements/Services Living arrangements for the past 2 months: [place] Lives with:: Spouse, Pets Patient language and need for interpreter reviewed:: Yes Do you feel safe going back to the place where you live?: Yes      Need for Family Participation in Patient Care: No (Comment) Care giver support system  in place?: Yes (comment) Current home services: DME Criminal Activity/Legal Involvement Pertinent to Current Situation/Hospitalization: No - Comment as needed  Activities of Daily Living Home Assistive Devices/Equipment: Cane (specify quad or straight) ADL Screening (condition at time of admission) Patient's cognitive ability adequate to safely complete daily activities?: Yes Is the patient deaf or have difficulty hearing?: No Does the patient have difficulty seeing, even when wearing glasses/contacts?: No Does the patient have difficulty concentrating, remembering, or making decisions?: No Patient able to express need for assistance with ADLs?: Yes Does the patient have difficulty dressing or bathing?: No Independently performs ADLs?: Yes (appropriate for developmental age) Does the patient have difficulty walking or climbing stairs?: Yes Weakness of Legs: Both Weakness of Arms/Hands: None  Permission Sought/Granted Permission sought to share information with : Chartered certified accountant granted to share information with : Yes, Verbal Permission Granted     Permission granted to share info w AGENCY: HH        Emotional Assessment Appearance:: Appears stated age Attitude/Demeanor/Rapport: Engaged Affect (typically observed): Pleasant Orientation: : Oriented to Self, Oriented to Place, Oriented to  Time, Oriented to Situation Alcohol / Substance Use: Not Applicable Psych Involvement: No (comment)  Admission diagnosis:  Ventral hernia without obstruction or gangrene [K43.9] Lower abdominal pain [R10.30] AKI (acute kidney injury) ([place]) [N17.9] Incarcerated hernia [K46.0] Hypertension, unspecified type [I10] Patient Active Problem List  Diagnosis Date Noted   AKI (acute kidney injury) ([place]) 12/25/2021   Abdominal pain 12/25/2021   Incarcerated hernia 12/25/2021   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   Diarrhea 10/16/2013   IBS (irritable bowel syndrome) 08/08/2012    GERD (gastroesophageal reflux disease) 08/08/2012   Tremor 08/05/2011   Dysphagia 08/05/2011   Shortness of breath 09/16/2010   Undiagnosed cardiac murmurs 09/16/2010   Essential hypertension, benign 09/16/2010   Diabetes mellitus ([place]) 09/16/2010   PCP:  Glenda Chroman, MD Pharmacy:   [id number] [place] [id number]: [phone]: [phone] [id number]: [phone]: [phone]    Social Determinants of Health (SDOH) Interventions    Readmission Risk Interventions    12/28/2021   12:58 PM  Readmission Risk Prevention Plan  Transportation Screening Complete  Home Care Screening Complete  Medication Review (RN CM) Complete

## 2021-12-28 NOTE — Consult Note (Signed)
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 Minor degree of hemorrhagic transformation and infarct is likely 3 days out, so OK for ASA 81 mg and Plavix 75 mg, maintain for 90 days. Advise high intensity statin with Lipitor 80 mg QD. Advise further evaluation of vascular risk factors with MRA neck, lipid panel. HgbA1C goal < 7. Tolerate permissive hypertension, goal < 170/90 for now.   Recommendations: Our recommendations are outlined below.  Diagnostic Studies : MRA neck with contrast  Laboratory Studies : Recommend Lipid panel Please order  Antithrombotic Medications : Aspirin 81 mg PO dailyClopidogrel 75 mg daily  Nursing Recommendations : Telemetry, IV Fluids, avoid dextrose containing fluids, Maintain euglycemiaNeuro checks q4 hrs x 24 hrs and then per shiftHead of bed 30 degrees  Consultations : Recommend Speech therapy if failed dysphagia screenPhysical therapy/Occupational therapy  Miscellaneous : Tolerate permissive hypertension, goal < 170/90 for now.  ----------------------------------------------------------------------------------------------------    Metrics: [facility] Notification Time: 12/28/2021 21:05:51 Stamp Time: 12/28/2021 21:10:16 Callback Response Time: 12/28/2021 21:11:15    Imaging MRI brain/ MRA head 1. Moderate-to-large infarct in the right MCA territory involving the right temporal lobe, anterior occipital lobe, and anteroinferior parietal lobe, which is likely subacute given  associated acute to early subacute petechial hemorrhage and areas of contrast enhancement. 2. Evaluation of the intracranial vasculature on MRA is limited motion artifact. Within this limitation, there is mild-to-moderate stenosis in the right M1 segment and multifocal poor perfusion in several right M2 and M3 branches. Additional mild irregularity in the right M2 branches and distal ACAs. Additional poor signal in the proximal right P2 may be artifactual. If there is a clinical indication for more definitive evaluation of the intracranial vasculature, a CTA of the head is recommended.    Labs HgbA1C 8.0   ----------------------------------------------------------------------------------------------------  Chief Complaint: abnormal MRI brain  History of Present Illness: Patient is a 76 year old Female. Patient hospitalized since 8/4 for abdominal pain found to be due to incarcerated hernia. Underwent surgery on the 4th and upon awakening has been having persistent headache and photophobia. Denies focal weakness/numbness. On exam patient reports numbness over the left side and decreased vison. MRI brain shows right MCA territory infarct with minor petechial hemorrhage and right M1 stenosis.   Past Medical History:      Hypertension      Diabetes Mellitus      There is no history of Stroke Other PMH:  TIA  Medications:  No Anticoagulant use  No Antiplatelet use Reviewed EMR for current medications  Allergies:  Reviewed Description: codeine  Social History: Smoking: No  Family History:  There is no family history of premature cerebrovascular disease pertinent to this consultation  ROS : 14 Points Review of Systems was performed and was negative except mentioned in HPI.  Past Surgical History: There Is No Surgical History Contributory To Today's Visit   Examination: BP(159/56), Pulse(72), 1A: Level of Consciousness - Alert; keenly responsive + 0 1B: Ask Month  and Age - Both Questions Right + 0 1C: Blink Eyes & Squeeze  Hands - Performs Both Tasks + 0 2: Test Horizontal Extraocular Movements - Normal + 0 3: Test Visual Fields - Partial Hemianopia + 1 4: Test Facial Palsy (Use Grimace if Obtunded) - Normal symmetry + 0 5A: Test Left Arm Motor Drift - No Drift for 10 Seconds + 0 5B: Test Right Arm Motor Drift - No Drift for 10 Seconds + 0 6A: Test Left Leg Motor Drift - No Drift for 5 Seconds + 0 6B: Test Right Leg Motor Drift - No Drift for 5 Seconds + 0 7: Test Limb Ataxia (FNF/Heel-Shin) - No Ataxia + 0 8: Test Sensation - Mild-Moderate Loss: Less Sharp/More Dull + 1 9: Test Language/Aphasia - Normal; No aphasia + 0 10: Test Dysarthria - Normal + 0 11: Test Extinction/Inattention - No abnormality + 0  NIHSS Score: 2 NIHSS Free Text : decreased LT left leg, face     Patient / Family was informed the Neurology Consult would occur via TeleHealth consult by way of interactive audio and video telecommunications and consented to receiving care in this manner.  Patient is being evaluated for possible acute neurologic impairment and high probability of imminent or life - threatening deterioration.I spent total of 35 minutes providing care to this patient, including time for face to face visit via telemedicine, review of medical records, imaging studies and discussion of findings with providers, the patient and / or family.   Dr Serita Grammes   [facility] For Inpatient follow-up with [facility] physician please call [facility] [phone]. This is not an outpatient service. Post hospital discharge, please contact hospital directly.

## 2021-12-28 NOTE — Progress Notes (Signed)
PROGRESS NOTE   [name]  XIP:[id number] DOB: [birth year] DOA: 12/25/2021 PCP: Glenda Chroman, MD   Chief Complaint  Patient presents with   Abdominal Pain   Level of care: Med-Surg  Brief Admission History:  76 y.o. female, with past medical history of hypertension, diabetes mellitus, neuropathy, GI bleed, TIA, diverticulosis, irritable bowel syndrome patient presents to ED secondary to complaints of abdominal pain. -Reports abdominal pain x2 weeks, report diffuse abdominal pain, seen by her PCP recently where she was given Augmentin for presumed diverticulitis, initially improved, but then got worse, she had CT abdomen pelvis a week ago, which was unremarkable, and was sent today by her PCP due to worsening abdominal pain, she reports diarrhea that started to develop today, reports nausea, she had an episode of vomiting while in ED, denies fever, chills, and urinary symptoms, no chest pain or shortness of breath. - in ED CT abdomen and pelvis showing ventral hernia with, labs were significant for dehydration with creatinine of 1.65, hemoglobin of 17, platelet count of 423 K, white blood cell count elevated at 11.8, patient was seen by general surgery with concern of incarcerated incisional hernia with recommendation for emergent surgery, [facility] hospitalist consulted to admit.   Assessment and Plan: Abdominal pain from incarcerated incisional hernia Post op s/p open incisional hernia repair  -Patient presented with progressive abdominal pain over the last few days. -General surgery input greatly appreciated,  patient be taken emergently to the OR on 12/25/21.   AKI -Due to volume depletion and dehydration, continue with IV fluids and hold nephrotoxic medications  Headaches - because they are persisting will get a CT head without contrast to further investigate - CT findings suspicious for temporoparietal mass, ordered MRI brain w wo contrast - family reports severe claustrophobia - may  not be able to do, but has done  years ago with sedation - ordered lorazepam prn for sedation for MRI brain.  - prn meds ordered for pain   Hypertension, uncontrolled -continue metoprolol to 50 mg, continue diltiazem 180 mg and increased dose of as needed hydralazine, resuming home zestoretic and hyralazine 50 mg TID    Diabetes mellitus -Will hold Jardiance, glipizide, Actos and metformin, -Will keep an insulin sliding scale  CBG (last 3)  Recent Labs    12/27/21 2026 12/28/21 0745 12/28/21 1136  GLUCAP 180* 166* 237*    GERD -Protonix 40 mg daily  Hyperlipidemia -Resume pravastatin   Hypothyroidism -resume home levothyroxine   Secondary polycythemia -recheck CBC in AM    Thrombocytosis -Due to above, will keep on DVT prophylaxis   DVT prophylaxis: sq heparin  Code Status: Full  Family Communication:  Disposition: Status is: Inpatient Remains inpatient appropriate because: intensity    Consultants:  Surgery  Procedures:  Open incisional hernia repair for incarcerated incisional hernia Antimicrobials:    Subjective: Pt still having headaches.  She says her abdomen feels much better overall.    Objective: Vitals:   12/27/21 1001 12/27/21 1318 12/27/21 2024 12/28/21 0420  BP: (!) 184/66 (!) 161/72 (!) 178/76 (!) 156/61  Pulse: 73 75 93 80  Resp:  '20 18 18  '$ Temp:  98.4 F (36.9 C) 98.4 F (36.9 C) 98.4 F (36.9 C)  TempSrc:  Oral    SpO2:  91% 91% (!) 88%  Weight:      Height:        Intake/Output Summary (Last 24 hours) at 12/28/2021 1558 Last data filed at 12/28/2021 1230 Gross per 24 hour  Intake 980 ml  Output --  Net 980 ml   Filed Weights   12/25/21 1329 12/25/21 1941 12/26/21 0400  Weight: 77.1 kg 77.4 kg 81.4 kg   Examination:  General exam: Appears calm and comfortable  Respiratory system: Clear to auscultation. Respiratory effort normal. Cardiovascular system: normal S1 & S2 heard. No JVD, murmurs, rubs, gallops or clicks. No pedal  edema. Gastrointestinal system: Abdomen is nondistended, soft and tender to light palpation. Wounds clean, dry and intact.   No organomegaly or masses felt. Normal bowel sounds heard. Central nervous system: Alert and oriented. No focal neurological deficits. Extremities: Symmetric 5 x 5 power. Skin: No rashes, lesions or ulcers. Psychiatry: Judgement and insight appear normal. Mood & affect appropriate.   Data Reviewed: I have personally reviewed following labs and imaging studies  CBC: Recent Labs  Lab 12/25/21 1347 12/26/21 0641 12/27/21 0412 12/28/21 0357  WBC 11.8* 17.0* 13.2* 16.5*  NEUTROABS  --   --  11.0*  --   HGB 17.1* 16.0* 15.6* 16.1*  HCT 53.7* 50.9* 50.5* 51.3*  MCV 85.0 85.3 87.1 85.4  PLT 428* 442* 376 434*    Basic Metabolic Panel: Recent Labs  Lab 12/25/21 1347 12/26/21 0641 12/26/21 0643 12/27/21 0412 12/28/21 0756  NA 138 141  --  138 137  K 3.8 3.3*  --  4.5 4.4  CL 105 111  --  109 107  CO2 24 22  --  22 20*  GLUCOSE 310* 163*  --  123* 141*  BUN 33* 26*  --  23 24*  CREATININE 1.62* 1.42*  --  1.52* 1.55*  CALCIUM 8.6* 8.2*  --  8.4* 8.9  MG  --   --  1.8 2.4  --     CBG: Recent Labs  Lab 12/27/21 1139 12/27/21 1618 12/27/21 2026 12/28/21 0745 12/28/21 1136  GLUCAP 164* 142* 180* 166* 237*    Recent Results (from the past 240 hour(s))  MRSA Next Gen by PCR, Nasal     Status: None   Collection Time: 12/25/21  7:43 PM   Specimen: Nasal Mucosa; Nasal Swab  Result Value Ref Range Status   MRSA by PCR Next Gen NOT DETECTED NOT DETECTED Final    Comment: (NOTE) The GeneXpert MRSA Assay (FDA approved for NASAL specimens only), is one component of a comprehensive MRSA colonization surveillance program. It is not intended to diagnose MRSA infection nor to guide or monitor treatment for MRSA infections. Test performance is not FDA approved in patients less than 72 years old. Performed at [facility], [address], [place], [place]  [address]      Radiology Studies: CT HEAD WO CONTRAST (5MM)  Result Date: 12/28/2021 CLINICAL DATA:  Provided history: Headache, new or worsening. Additional history provided: New onset headache top of head. Patient feels as though she has a fever. EXAM: CT HEAD WITHOUT CONTRAST TECHNIQUE: Contiguous axial images were obtained from the base of the skull through the vertex without intravenous contrast. RADIATION DOSE REDUCTION: This exam was performed according to the departmental dose-optimization program which includes automated exposure control, adjustment of the mA and/or kV according to patient size and/or use of iterative reconstruction technique. COMPARISON:  No pertinent prior exams available for comparison. FINDINGS: Brain: Mild generalized parenchymal atrophy. Moderate-sized region of cortical/subcortical edema within the right temporal and parietal lobes. Lacunar infarct within the left basal ganglia. Background advanced patchy and confluent hypoattenuation within the cerebral white matter, nonspecific but compatible with chronic small vessel ischemic  disease. There is no acute intracranial hemorrhage. No extra-axial fluid collection. No midline shift. Vascular: No hyperdense vessel. Atherosclerotic calcifications. Skull: No fracture or aggressive osseous lesion. Sinuses/Orbits: No mass or acute finding within the imaged orbits. 11 mm mucous retention cyst within the left sphenoid sinus. These results will be called to the ordering clinician or representative by the Radiologist Assistant, and communication documented in the PACS or [employer]. IMPRESSION: Moderate-sized region of cortical/subcortical edema within the right temporal and parietal lobes, indeterminate. Primary differential considerations include an acute/early subacute infarct, cerebritis or edema related to an underlying mass. A brain MRI without and with contrast is recommended for further evaluation. Chronic lacunar infarct within  the left basal ganglia. Background advanced chronic small vessel image changes within the cerebral white matter. Mild generalized parenchymal atrophy. 11 mm left sphenoid sinus mucous retention cyst. Electronically Signed   By: Kellie Simmering D.O.   On: 12/28/2021 11:05   DG Abd 1 View  Result Date: 12/27/2021 CLINICAL DATA:  Nausea with right lower abdominal pain. Recent hernia repair. EXAM: ABDOMEN - 1 VIEW COMPARISON:  Abdominopelvic CT 12/25/2021. FINDINGS: 1036 hours. Two views are submitted. Portions of the left abdomen are excluded from this portable study. The visualized bowel gas pattern is nonobstructive. No supine evidence of free intraperitoneal air. Cholecystectomy clips and diffuse aortic atherosclerosis are noted. IMPRESSION: No evidence of acute abdominal process on supine radiography. Electronically Signed   By: Richardean Sale M.D.   On: 12/27/2021 12:09    Scheduled Meds:  Chlorhexidine Gluconate Cloth  6 each Topical Daily   citalopram  10 mg Oral Daily   diltiazem  240 mg Oral Daily   gabapentin  100 mg Oral QHS   heparin  5,000 Units Subcutaneous Q8H   hydrALAZINE  50 mg Oral Q8H   lisinopril  20 mg Oral Daily   And   hydrochlorothiazide  12.5 mg Oral Daily   insulin aspart  0-9 Units Subcutaneous TID WC   levothyroxine  50 mcg Oral Daily   metoprolol succinate  50 mg Oral Daily   pantoprazole  40 mg Oral Daily   pravastatin  20 mg Oral q1800   simethicone  80 mg Oral QID   Continuous Infusions:   LOS: 3 days   Time spent: 35 mins  Digna Countess Wynetta Emery, MD How to contact the [facility] Attending or Consulting provider [place] or covering provider during after hours [place], for this patient?  Check the care team in [place] and look for a) attending/consulting [facility] provider listed and b) the [facility] team listed Log into [email or web address] and use [place]'s universal password to access. If you do not have the password, please contact the hospital operator. Locate the [facility] provider you are  looking for under [facility] and page to a number that you can be directly reached. If you still have difficulty reaching the provider, please page the [facility] (Director on Call) for the Hospitalists listed on amion for assistance.  12/28/2021, 3:58 PM

## 2021-12-28 NOTE — Progress Notes (Signed)
3 Days Post-Op  Subjective: Patient ambulating in the hallway.  Only has mild incisional pain.  Had a large bowel movement this morning.  Is passing flatus.  Objective: Vital signs in last 24 hours: Temp:  [98.4 F (36.9 C)] 98.4 F (36.9 C) (08/07 0420) Pulse Rate:  [75-93] 80 (08/07 0420) Resp:  [18-20] 18 (08/07 0420) BP: (156-178)/(61-76) 156/61 (08/07 0420) SpO2:  [88 %-91 %] 88 % (08/07 0420) Last BM Date : 12/24/21  Intake/Output from previous day: 08/06 0701 - 08/07 0700 In: 350 [P.O.:350] Out: -  Intake/Output this shift: No intake/output data recorded.  General appearance: alert, cooperative, and no distress GI: Soft, incision healing well with minimal ecchymosis.  Bowel sounds active.  Lab Results:  Recent Labs    12/27/21 0412 12/28/21 0357  WBC 13.2* 16.5*  HGB 15.6* 16.1*  HCT 50.5* 51.3*  PLT 376 434*   BMET Recent Labs    12/27/21 0412 12/28/21 0756  NA 138 137  K 4.5 4.4  CL 109 107  CO2 22 20*  GLUCOSE 123* 141*  BUN 23 24*  CREATININE 1.52* 1.55*  CALCIUM 8.4* 8.9   PT/INR No results for input(s): "LABPROT", "INR" in the last 72 hours.  Studies/Results: DG Abd 1 View  Result Date: 12/27/2021 CLINICAL DATA:  Nausea with right lower abdominal pain. Recent hernia repair. EXAM: ABDOMEN - 1 VIEW COMPARISON:  Abdominopelvic CT 12/25/2021. FINDINGS: 1036 hours. Two views are submitted. Portions of the left abdomen are excluded from this portable study. The visualized bowel gas pattern is nonobstructive. No supine evidence of free intraperitoneal air. Cholecystectomy clips and diffuse aortic atherosclerosis are noted. IMPRESSION: No evidence of acute abdominal process on supine radiography. Electronically Signed   By: Richardean Sale M.D.   On: 12/27/2021 12:09    Anti-infectives: Anti-infectives (From admission, onward)    Start     Dose/Rate Route Frequency Ordered Stop   12/25/21 1645  ceFAZolin (ANCEF) IVPB 2g/100 mL premix        2 g 200  mL/hr over 30 Minutes Intravenous  Once 12/25/21 1642 12/25/21 1811       Assessment/Plan: s/p Procedure(s): HERNIA REPAIR INCISIONAL Impression: Bowel function has returned.  Leukocytosis noted.  Will monitor.  Will advance to full liquid diet.  Repeat CBC in a.m.  LOS: 3 days    Aviva Signs 12/28/2021

## 2021-12-29 ENCOUNTER — Inpatient Hospital Stay (HOSPITAL_COMMUNITY): Payer: PPO

## 2021-12-29 ENCOUNTER — Encounter (HOSPITAL_COMMUNITY): Payer: Self-pay | Admitting: Surgery

## 2021-12-29 DIAGNOSIS — I6389 Other cerebral infarction: Secondary | ICD-10-CM | POA: Diagnosis not present

## 2021-12-29 DIAGNOSIS — I639 Cerebral infarction, unspecified: Secondary | ICD-10-CM | POA: Clinically undetermined

## 2021-12-29 LAB — MAGNESIUM: Magnesium: 2.3 mg/dL (ref 1.7–2.4)

## 2021-12-29 LAB — GLUCOSE, CAPILLARY
Glucose-Capillary: 164 mg/dL — ABNORMAL HIGH (ref 70–99)
Glucose-Capillary: 232 mg/dL — ABNORMAL HIGH (ref 70–99)
Glucose-Capillary: 170 mg/dL — ABNORMAL HIGH (ref 70–99)
Glucose-Capillary: 169 mg/dL — ABNORMAL HIGH (ref 70–99)

## 2021-12-29 LAB — CBC
HCT: 46.7 % — ABNORMAL HIGH (ref 36.0–46.0)
Hemoglobin: 14.5 g/dL (ref 12.0–15.0)
MCH: 26.6 pg (ref 26.0–34.0)
MCHC: 31 g/dL (ref 30.0–36.0)
MCV: 85.5 fL (ref 80.0–100.0)
Platelets: 382 10*3/uL (ref 150–400)
RBC: 5.46 MIL/uL — ABNORMAL HIGH (ref 3.87–5.11)
RDW: 20.5 % — ABNORMAL HIGH (ref 11.5–15.5)
WBC: 10 10*3/uL (ref 4.0–10.5)
nRBC: 0 % (ref 0.0–0.2)

## 2021-12-29 LAB — BASIC METABOLIC PANEL
Anion gap: 8 (ref 5–15)
BUN: 26 mg/dL — ABNORMAL HIGH (ref 8–23)
CO2: 23 mmol/L (ref 22–32)
Calcium: 8.8 mg/dL — ABNORMAL LOW (ref 8.9–10.3)
Chloride: 108 mmol/L (ref 98–111)
Creatinine, Ser: 1.58 mg/dL — ABNORMAL HIGH (ref 0.44–1.00)
GFR, Estimated: 33 mL/min — ABNORMAL LOW (ref >60)
Glucose, Bld: 135 mg/dL — ABNORMAL HIGH (ref 70–99)
Potassium: 3.8 mmol/L (ref 3.5–5.1)
Sodium: 139 mmol/L (ref 135–145)

## 2021-12-29 LAB — ECHOCARDIOGRAM COMPLETE
Area-P 1/2: 3.21 cm2
Height: 62 in
S' Lateral: 2.8 cm
Weight: 2871.27 oz

## 2021-12-29 LAB — LIPID PANEL
Cholesterol: 156 mg/dL (ref 0–200)
HDL: 33 mg/dL — ABNORMAL LOW (ref >40)
LDL Cholesterol: 83 mg/dL (ref 0–99)
Total CHOL/HDL Ratio: 4.7 RATIO
Triglycerides: 199 mg/dL — ABNORMAL HIGH (ref <150)
VLDL: 40 mg/dL (ref 0–40)

## 2021-12-29 MED ORDER — HYDRALAZINE HCL 25 MG PO TABS
50.0000 mg | ORAL_TABLET | Freq: Three times a day (TID) | ORAL | Status: DC
  Administered 2021-12-29 – 2021-12-30 (×3): 50 mg via ORAL
  Filled 2021-12-29 (×2): qty 2

## 2021-12-29 MED ORDER — HYDRALAZINE HCL 20 MG/ML IJ SOLN: 10.0000 mg | INTRAMUSCULAR | Status: DC | PRN

## 2021-12-29 MED ORDER — ASPIRIN 81 MG PO CHEW
81.0000 mg | CHEWABLE_TABLET | Freq: Every day | ORAL | Status: DC
  Administered 2021-12-29 – 2021-12-30 (×2): 81 mg via ORAL
  Filled 2021-12-29 (×2): qty 1

## 2021-12-29 MED ORDER — PRAVASTATIN SODIUM 40 MG PO TABS: 80.0000 mg | ORAL_TABLET | Freq: Every day | ORAL | Status: DC

## 2021-12-29 MED ORDER — METOPROLOL SUCCINATE ER 50 MG PO TB24
50.0000 mg | ORAL_TABLET | Freq: Every day | ORAL | Status: DC
  Administered 2021-12-30: 50 mg via ORAL
  Filled 2021-12-29: qty 1

## 2021-12-29 MED ORDER — ATORVASTATIN CALCIUM 40 MG PO TABS
40.0000 mg | ORAL_TABLET | Freq: Every day | ORAL | Status: DC
  Administered 2021-12-29 – 2021-12-30 (×2): 40 mg via ORAL
  Filled 2021-12-29 (×2): qty 1

## 2021-12-29 MED ORDER — DILTIAZEM HCL ER COATED BEADS 180 MG PO CP24
180.0000 mg | ORAL_CAPSULE | Freq: Every day | ORAL | Status: DC
  Administered 2021-12-29 – 2021-12-30 (×2): 180 mg via ORAL
  Filled 2021-12-29 (×2): qty 1

## 2021-12-29 MED ORDER — HYDRALAZINE HCL 25 MG PO TABS
25.0000 mg | ORAL_TABLET | Freq: Three times a day (TID) | ORAL | Status: DC
  Administered 2021-12-29: 25 mg via ORAL
  Filled 2021-12-29: qty 1

## 2021-12-29 MED ORDER — CLOPIDOGREL BISULFATE 75 MG PO TABS
75.0000 mg | ORAL_TABLET | Freq: Every day | ORAL | Status: DC
  Administered 2021-12-29 – 2021-12-30 (×2): 75 mg via ORAL
  Filled 2021-12-29 (×2): qty 1

## 2021-12-29 MED ORDER — METOPROLOL SUCCINATE ER 25 MG PO TB24
25.0000 mg | ORAL_TABLET | Freq: Every day | ORAL | Status: DC
  Administered 2021-12-29: 25 mg via ORAL
  Filled 2021-12-29: qty 1

## 2021-12-29 NOTE — Consult Note (Addendum)
I connected with  Justine Null on 12/29/21 by a video enabled telemedicine application and verified that I am speaking with the correct person using two identifiers.   I discussed the limitations of evaluation and management by telemedicine. The patient expressed understanding and agreed to proceed.  Location of patient: [facility] Location of physician: [facility]  Neurology Consultation Reason for Consult: stroke Referring Physician: Dr Irwin Brakeman  CC: headache while inpatient ( presented with abdominal apin)  History is obtained from: Patient, chart review  HPI: [name] is a 76 y.o. female with past medical history of TIA, hypertension, diabetes, diverticulosis, GI bleed, neuropathy who initially presented on 12/25/2021 with abdominal pain.  Patient was evaluated by general surgery and diagnosed with right lower quadrant incisional hernia concerning for extenuation s/p open incisional hernia repair with medium bard plug and patch mesh on 12/25/2021.  Postop, patient reported headache which did not improve with Tylenol and tramadol.  Her systolic blood pressure was noted to be elevated ranging from 150 to 195 (on arrival blood pressure was 197/83).  CT head was ordered on 12/28/2021 which showed moderate-sized region of cortical/subcortical edema within the right temporal and parietal lobes, indeterminate.  MRI brain with and without contrast was obtained subsequently which showed moderate-to-large infarct in the right MCA territory involving the right temporal lobe, anterior occipital lobe, and anteroinferior parietal lobe, which is likely subacute given associated acute to early subacute petechial hemorrhage and areas of contrast enhancement.  Therefore, neuro was consulted.  Last known normal 12/25/2021 No TPA as outside window No thrombectomy as no large vessel occlusion Event happened at home  mRS 0  ROS: All other systems reviewed and negative except as  noted in the HPI.   Past Medical History:  Diagnosis Date   Anxiety    Benign essential tremor    Cardiac murmur    Reportedly for years-mild   Chronic diarrhea    Degenerative joint disease    Diabetes mellitus    Diabetic neuropathy (HCC)    Diverticulosis    Essential hypertension, benign    GERD (gastroesophageal reflux disease)    GI bleed    NSAID use   PONV (postoperative nausea and vomiting)    Seasonal allergies    Shortness of breath    TIA (transient ischemic attack)    1998   Type 2 diabetes mellitus (HCC)    Urinary incontinence     Family History  Problem Relation Age of Onset   Cancer Father        Leukemia   Colon cancer Neg Hx     Social History:  reports that she quit smoking about 24 years ago. Her smoking use included cigarettes. She has a 80.00 pack-year smoking history. She has never used smokeless tobacco. She reports that she does not drink alcohol and does not use drugs.   Medications Prior to Admission  Medication Sig Dispense Refill Last Dose   clonazePAM (KLONOPIN) 0.5 MG tablet Take 0.5 mg by mouth 2 (two) times daily.    Past Week   ergocalciferol (VITAMIN D2) 50000 UNITS capsule Take 50,000 Units by mouth once a week.   Past Week   glipiZIDE (GLUCOTROL XL) 5 MG 24 hr tablet Take 10 mg by mouth daily.   4 Past Week   levothyroxine (SYNTHROID, LEVOTHROID) 50 MCG tablet Take 50 mcg by mouth daily.   Past Week   lovastatin (MEVACOR) 20 MG tablet Take 20 mg by mouth  at bedtime.   Past Week   pantoprazole (PROTONIX) 40 MG tablet TAKE 1 TABLET BY MOUTH EVERY DAY IN THE MORNING (Patient taking differently: Take 40 mg by mouth daily.) 90 tablet 3 Past Week   traMADol (ULTRAM) 50 MG tablet Take 50 mg by mouth every 6 (six) hours as needed for moderate pain.   Past Week   citalopram (CELEXA) 10 MG tablet Take 10 mg by mouth daily. (Patient not taking: Reported on 12/27/2021)   Not Taking   dicyclomine (BENTYL) 20 MG tablet Take 1 tablet (20 mg total) by  mouth daily. (Patient not taking: Reported on 12/27/2021) 90 tablet 0 Not Taking   diltiazem (CARDIZEM CD) 180 MG 24 hr capsule Take 180 mg by mouth daily.   (Patient not taking: Reported on 12/27/2021)   Not Taking   diphenoxylate-atropine (LOMOTIL) 2.5-0.025 MG tablet Take 1 tablet by mouth as needed. Ulcer stomach (Patient not taking: Reported on 12/27/2021) 30 tablet 0 Not Taking   empagliflozin (JARDIANCE) 25 MG TABS tablet Take by mouth daily. (Patient not taking: Reported on 12/27/2021)   Not Taking   gabapentin (NEURONTIN) 100 MG capsule Take 100 mg by mouth at bedtime. (Patient not taking: Reported on 12/27/2021)   Not Taking   Lactobacillus-Inulin ([place]) Take by mouth. (Patient not taking: Reported on 12/27/2021)   Not Taking   lidocaine (LMX) 4 % cream Apply 1 application. topically as needed. (Patient not taking: Reported on 12/27/2021)   Not Taking   lisinopril-hydrochlorothiazide (PRINZIDE,ZESTORETIC) 20-12.5 MG tablet Take 1 tablet by mouth daily.  (Patient not taking: Reported on 12/27/2021)   Not Taking   loratadine (CLARITIN) 10 MG tablet Take 10 mg by mouth daily as needed.  (Patient not taking: Reported on 12/27/2021)   Not Taking   metFORMIN (GLUCOPHAGE) 500 MG tablet Take 500 mg by mouth. 2 am and 1 in pm (Patient not taking: Reported on 12/27/2021)  4 Not Taking   metoprolol succinate (TOPROL-XL) 25 MG 24 hr tablet Take 25 mg by mouth daily. (Patient not taking: Reported on 12/27/2021)   Not Taking   pioglitazone (ACTOS) 15 MG tablet Take 15 mg by mouth daily. (Patient not taking: Reported on 12/27/2021)   Not Taking      Exam: Current vital signs: BP (!) 145/65 (BP Location: Right Arm)   Pulse 84   Temp 98.3 F (36.8 C)   Resp 20   Ht '5\' 2"'$  (1.575 m)   Wt 81.4 kg   SpO2 91%   BMI 32.82 kg/m  Vital signs in last 24 hours: Temp:  [98 F (36.7 C)-98.6 F (37 C)] 98.3 F (36.8 C) (08/08 0328) Pulse Rate:  [65-88] 84 (08/08 0850) Resp:  [16-20] 20 (08/08  0328) BP: (145-159)/(52-66) 145/65 (08/08 0850) SpO2:  [91 %-93 %] 91 % (08/08 0328)   Physical Exam  Constitutional: Appears well-developed and well-nourished.  Psych: Affect appropriate to situation Eyes: No scleral injection Neuro: AOx3, no aphasia, PERRLA, EOMI, appears to have right hemianopia, no facial droop, antigravity strength in upper extremities without drift, sensation intact to light touch, has right more than left essential tremor NIHSS 2 (complete hemianopia)  I have reviewed labs in epic and the results pertinent to this consultation are: CBC:  Recent Labs  Lab 12/27/21 0412 12/28/21 0357 12/29/21 0353  WBC 13.2* 16.5* 10.0  NEUTROABS 11.0*  --   --   HGB 15.6* 16.1* 14.5  HCT 50.5* 51.3* 46.7*  MCV 87.1 85.4 85.5  PLT 376 434* 121    Basic Metabolic Panel:  Lab Results  Component Value Date   NA 139 12/29/2021   K 3.8 12/29/2021   CO2 23 12/29/2021   GLUCOSE 135 (H) 12/29/2021   BUN 26 (H) 12/29/2021   CREATININE 1.58 (H) 12/29/2021   CALCIUM 8.8 (L) 12/29/2021   GFRNONAA 33 (L) 12/29/2021   GFRAA 71 (L) 10/02/2012   Lipid Panel:  Lab Results  Component Value Date   LDLCALC 83 12/29/2021   HgbA1c:  Lab Results  Component Value Date   HGBA1C 8.0 (H) 12/25/2021   Urine Drug Screen: No results found for: "LABOPIA", "COCAINSCRNUR", "LABBENZ", "AMPHETMU", "THCU", "LABBARB"  Alcohol Level No results found for: "ETH"   I have reviewed the images obtained:  CT Head without contrast 12/28/2021: Moderate-sized region of cortical/subcortical edema within the right temporal and parietal lobes, indeterminate. Primary differential considerations include an acute/early subacute infarct, cerebritis or edema related to an underlying mass. A brain MRI without and with contrast is recommended for further evaluation. 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 Background advanced chronic small vessel image changes within the cerebral white matter. Mild  generalized parenchymal atrophy. 11 mm left sphenoid sinus mucous retention cyst.  MRI Brain w and wo contrast and MRA head w and wo contrast 12/28/2021:  1. 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 2. Evaluation of the intracranial vasculature on MRA is limited motion artifact. Within this limitation, there is mild-to-moderate stenosis in the right M1 segment and multifocal poor perfusion in several right M2 and M3 branches. Additional mild irregularity in the right M2 branches and distal ACAs. Additional poor signal in the proximal right P2 may be artifactual. If there is a clinical indication for more definitive evaluation of the intracranial vasculature, a CTA of the head is recommended.   ASSESSMENT/PLAN: 76 year old female who presented with abdominal pain and was diagnosed with incarcerated hernia status postrepair.  Patient reported headache during hospitalization and MRI brain showed subacute right MCA infarct.  Subacute infarct, right MCA Cerebral edema Headache Hypertension Diabetes Hyperlipidemia Carotid stenosis, asymptomatic -Etiology: Intracranial stenosis  Recommendations: -Recommend aspirin 81 mg and Plavix 75 mg daily for 2 months followed by aspirin 80 mg daily -Recommend atorvastatin 40 mg daily for secondary stroke prevention -TTE ordered and pending -For headache, can increase gabapentin to 100 mg 3 times daily.  Can also use Valium 5 mg and taper it over the next few days -Goal blood pressure: Normotension.  Patient is outside window for permissive hypertension -Management of stroke risk factors -Stroke education including BEFAST -PT/OT.  Will likely need driving evaluation prior to resuming driving.  This was discussed with daughter at  bedside -Follow-up with neurology in 3 months.  Patient requested referral to neurology in [place] -Discussed plan with Dr. Wynetta Emery and daughter at bedside  Thank you for allowing [place] to participate in the care of this patient. If you have any further questions, please contact  me or neurohospitalist.   Zeb Comfort Epilepsy [facility]

## 2021-12-29 NOTE — Progress Notes (Signed)
*  PRELIMINARY RESULTS* Echocardiogram 2D Echocardiogram has been performed.  Samuel [place] 12/29/2021, 2:58 PM

## 2021-12-29 NOTE — Progress Notes (Addendum)
PROGRESS NOTE   [name]  JOI:[id number] DOB: December 16, 1945 DOA: 12/25/2021 PCP: Glenda Chroman, MD   Chief Complaint  Patient presents with   Abdominal Pain   Level of care: Med-Surg  Brief Admission History:  76 y.o. female, with past medical history of hypertension, diabetes mellitus, neuropathy, GI bleed, TIA, diverticulosis, irritable bowel syndrome patient presents to ED secondary to complaints of abdominal pain. -Reports abdominal pain x2 weeks, report diffuse abdominal pain, seen by her PCP recently where she was given Augmentin for presumed diverticulitis, initially improved, but then got worse, she had CT abdomen pelvis a week ago, which was unremarkable, and was sent today by her PCP due to worsening abdominal pain, she reports diarrhea that started to develop today, reports nausea, she had an episode of vomiting while in ED, denies fever, chills, and urinary symptoms, no chest pain or shortness of breath. - in ED CT abdomen and pelvis showing ventral hernia with, labs were significant for dehydration with creatinine of 1.65, hemoglobin of 17, platelet count of 423 K, white blood cell count elevated at 11.8, patient was seen by general surgery with concern of incarcerated incisional hernia with recommendation for emergent surgery, [facility] hospitalist consulted to admit.   Assessment and Plan: Abdominal pain from incarcerated incisional hernia Post op s/p open incisional hernia repair  -Patient presented with progressive abdominal pain over the last few days. -General surgery input greatly appreciated,  patient be taken emergently to the OR on 12/25/21.   CKD stage 3b  -initially thought AKI but no recent BMPs to compare, given her poorly controlled hypertension and diabetes suspect patient has CKD3b.   Headaches / Ischemic CVA  - because headaches persisted obtained a CT head without contrast to further investigate - CT findings suspicious for temporoparietal mass, ordered MRI  brain w wo contrast positive for subacute right temporoparetal infarct of moderate size. I spoke with neurologist Dr. Hortense Ramal and requested consultation.   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 -Will hold Jardiance, glipizide, Actos and metformin, -continue insulin sliding scale  CBG (last 3)  Recent Labs    12/28/21 2127 12/29/21 0748 12/29/21 1131  GLUCAP 193* 164* 232*   GERD -Protonix 40 mg daily  Hyperlipidemia -Resume pravastatin   Hypothyroidism -resumed home levothyroxine   Secondary polycythemia -Hg down to 14.5 today    Thrombocytosis -platelets down to 382   DVT prophylaxis: sq heparin  Code Status: Full  Family Communication:  Disposition: Status is: Inpatient Remains inpatient appropriate because: intensity    Consultants:  Surgery  Procedures:  Open incisional hernia repair for incarcerated incisional hernia Antimicrobials:    Subjective: Pt still having headaches, light sensitivity and pain behind the eyes.  She says her abdomen feels better and tolerating diet. She is having bowel movements.     Objective: Vitals:   12/28/21 1445 12/28/21 2030 12/29/21 0328 12/29/21 0850  BP: (!) 147/66 (!) 159/56 (!) 157/52 (!) 145/65  Pulse: 88 72 65 84  Resp: '17 16 20   '$ Temp: 98.6 F (37 C) 98 F (36.7 C) 98.3 F (36.8 C)   TempSrc: Oral Oral    SpO2: 93% 92% 91%   Weight:  Height:        Intake/Output Summary (Last 24 hours) at 12/29/2021 1250 Last data filed at 12/29/2021 0900 Gross per 24 hour  Intake 480 ml  Output --  Net 480 ml   Filed Weights   12/25/21 1329 12/25/21 1941 12/26/21 0400  Weight: 77.1 kg 77.4 kg 81.4 kg    Examination:  General exam: Appears calm and comfortable  Respiratory system: Clear to auscultation. Respiratory effort normal. Cardiovascular system: normal S1 & S2 heard. No JVD, murmurs, rubs, gallops or clicks. No pedal edema. Gastrointestinal system: Abdomen is nondistended, soft and tender to light palpation. Wounds clean, dry and intact.   No organomegaly or masses felt. Normal bowel sounds heard. Central nervous system: Alert and oriented. No focal neurological deficits. Extremities: Symmetric 5 x 5 power. Skin: No rashes, lesions or ulcers. Psychiatry: Judgement and insight appear normal. Mood & affect appropriate.   Data Reviewed: I have personally reviewed following labs and imaging studies  CBC: Recent Labs  Lab 12/25/21 1347 12/26/21 0641 12/27/21 0412 12/28/21 0357 12/29/21 0353  WBC 11.8* 17.0* 13.2* 16.5* 10.0  NEUTROABS  --   --  11.0*  --   --   HGB 17.1* 16.0* 15.6* 16.1* 14.5  HCT 53.7* 50.9* 50.5* 51.3* 46.7*  MCV 85.0 85.3 87.1 85.4 85.5  PLT 428* 442* 376 434* 361    Basic Metabolic Panel: Recent Labs  Lab 12/25/21 1347 12/26/21 0641 12/26/21 0643 12/27/21 0412 12/28/21 0756 12/29/21 0353  NA 138 141  --  138 137 139  K 3.8 3.3*  --  4.5 4.4 3.8  CL 105 111  --  109 107 108  CO2 24 22  --  22 20* 23  GLUCOSE 310* 163*  --  123* 141* 135*  BUN 33* 26*  --  23 24* 26*  CREATININE 1.62* 1.42*  --  1.52* 1.55* 1.58*  CALCIUM 8.6* 8.2*  --  8.4* 8.9 8.8*  MG  --   --  1.8 2.4  --  2.3    CBG: Recent Labs  Lab 12/28/21 1136 12/28/21 1632 12/28/21 2127 12/29/21 0748 12/29/21 1131  GLUCAP 237* 197* 193* 164* 232*    Recent Results (from the past 240 hour(s))  MRSA Next Gen by PCR, Nasal     Status: None   Collection Time: 12/25/21  7:43 PM   Specimen: Nasal Mucosa; Nasal Swab  Result Value Ref Range Status   MRSA by PCR Next Gen NOT DETECTED NOT DETECTED Final    Comment: (NOTE) The GeneXpert MRSA Assay (FDA approved for NASAL  specimens only), is one component of a comprehensive MRSA colonization surveillance program. It is not intended to diagnose MRSA infection nor to guide or monitor treatment for MRSA infections. Test performance is not FDA approved in patients less than 60 years old. Performed at [facility], [address], [place], [place] [address]      Radiology Studies: MR BRAIN W WO CONTRAST  Result Date: 12/28/2021 CLINICAL DATA:  Headache, new or worsening, concern for neoplasm on CT EXAM: MRI HEAD WITHOUT AND WITH CONTRAST MRA HEAD WITHOUT CONTRAST TECHNIQUE: Multiplanar, multi-echo pulse sequences of the brain and surrounding structures were acquired without and with intravenous contrast. Angiographic images of the Circle of Willis were acquired using MRA technique without intravenous contrast. CONTRAST:  52m GADAVIST GADOBUTROL 1 MMOL/ML IV SOLN COMPARISON:  No prior MRI, correlation is made with CT head 12/28/2021 FINDINGS: MRI HEAD FINDINGS Brain: Large area of restricted diffusion with  ADC correlate in the right temporal lobe, anterior occipital lobe, and anteroinferior parietal lobe (series 5, images 12-22), concerning for acute and/or subacute infarct. This area is associated with intrinsically T1 hyperintense and T2 hypointense material (series 18, image 28 and series 15, image 9), likely early subacute petechial hemorrhage, with some additional T1 isointense and T2 hyperintense material, likely more acute petechial hemorrhage, as well as contrast enhancement in this area (series 19, image 22), which suggests that the infarct is subacute. No definite enhancing mass in this area. Increased T2 signal throughout this area, with gyral swelling. No acute hemorrhage, mass, mass effect, or midline shift. No hydrocephalus or additional extra-axial collection. Vascular: Normal arterial flow voids. Normal arterial and venous enhancement. Skull and upper cervical spine: Normal marrow signal. Sinuses/Orbits: Mucous  retention cyst in the left sphenoid sinus. Status post bilateral lens replacements. Other: The mastoids are well aerated. MRA HEAD FINDINGS Evaluation is somewhat limited by motion artifact. Anterior circulation: Both internal carotid arteries are patent to the termini, with mild narrowing in the right distal petrous segment and right cavernous segment. Patent left A1. Stenosed or aplastic right A1. Anterior cerebral arteries demonstrate multifocal irregularity but are patent to their distal aspects. Mild-to-moderate stenosis in the right M1 segment (series 10, images 95, 99, 101). Poor perfusion in several M2 and M3 branches (series 10, image 114 138, 145). No stenosis or occlusion in the left M1 segment. Mild irregularity in the right M2 branches (series 10, image 96, 112). Distal MCA branches appear perfused. Posterior circulation: Distal vertebral arteries patent to the vertebrobasilar junction without stenosis. Basilar patent to its distal aspect. Superior cerebellar arteries patent bilaterally. Patent left P1. Fetal origin of the right PCA from a prominent right posterior communicating artery. Poor signal in the proximal right P2 (series 10, image 82), which may be artifactual. PCAs otherwise perfused to their distal aspects without stenosis. The left posterior communicating artery is not visualized. Anatomic variants: Fetal origin of the right PCA. IMPRESSION: 1. 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 2. Evaluation of the intracranial vasculature on MRA is limited motion artifact. Within this limitation, there is mild-to-moderate stenosis in the right M1 segment and multifocal poor perfusion in several right M2 and M3 branches. Additional mild irregularity in the right M2 branches  and distal ACAs. Additional poor signal in the proximal right P2 may be artifactual. If there is a clinical indication for more definitive evaluation of the intracranial vasculature, a CTA of the head is recommended. Impression #1 will be called to the ordering clinician or representative by the Radiologist Assistant, and communication documented in the PACS or [employer]. Electronically Signed   By: Merilyn Baba M.D.   On: 12/28/2021 19:29   MR ANGIO HEAD WO CONTRAST  Result Date: 12/28/2021 CLINICAL DATA:  Headache, new or worsening, concern for neoplasm on CT EXAM: MRI HEAD WITHOUT AND WITH CONTRAST MRA HEAD WITHOUT CONTRAST TECHNIQUE: Multiplanar, multi-echo pulse sequences of the brain and surrounding structures were acquired without and with intravenous contrast. Angiographic images of the Circle of Willis were acquired using MRA technique without intravenous contrast. CONTRAST:  34m GADAVIST GADOBUTROL 1 MMOL/ML IV SOLN COMPARISON:  No prior MRI, correlation is made with CT head 12/28/2021 FINDINGS: MRI HEAD FINDINGS Brain: Large area of restricted diffusion with ADC correlate in the right temporal  lobe, anterior occipital lobe, and anteroinferior parietal lobe (series 5, images 12-22), concerning for acute and/or subacute infarct. This area is associated with intrinsically T1 hyperintense and T2 hypointense material (series 18, image 28 and series 15, image 9), likely early subacute petechial hemorrhage, with some additional T1 isointense and T2 hyperintense material, likely more acute petechial hemorrhage, as well as contrast enhancement in this area (series 19, image 22), which suggests that the infarct is subacute. No definite enhancing mass in this area. Increased T2 signal throughout this area, with gyral swelling. No acute hemorrhage, mass, mass effect, or midline shift. No hydrocephalus or additional extra-axial collection. Vascular: Normal arterial flow voids. Normal arterial and venous  enhancement. Skull and upper cervical spine: Normal marrow signal. Sinuses/Orbits: Mucous retention cyst in the left sphenoid sinus. Status post bilateral lens replacements. Other: The mastoids are well aerated. MRA HEAD FINDINGS Evaluation is somewhat limited by motion artifact. Anterior circulation: Both internal carotid arteries are patent to the termini, with mild narrowing in the right distal petrous segment and right cavernous segment. Patent left A1. Stenosed or aplastic right A1. Anterior cerebral arteries demonstrate multifocal irregularity but are patent to their distal aspects. Mild-to-moderate stenosis in the right M1 segment (series 10, images 95, 99, 101). Poor perfusion in several M2 and M3 branches (series 10, image 114 138, 145). No stenosis or occlusion in the left M1 segment. Mild irregularity in the right M2 branches (series 10, image 96, 112). Distal MCA branches appear perfused. Posterior circulation: Distal vertebral arteries patent to the vertebrobasilar junction without stenosis. Basilar patent to its distal aspect. Superior cerebellar arteries patent bilaterally. Patent left P1. Fetal origin of the right PCA from a prominent right posterior communicating artery. Poor signal in the proximal right P2 (series 10, image 82), which may be artifactual. PCAs otherwise perfused to their distal aspects without stenosis. The left posterior communicating artery is not visualized. Anatomic variants: Fetal origin of the right PCA. IMPRESSION: 1. 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 2. Evaluation of the intracranial vasculature on MRA is limited motion artifact. Within this limitation, there is mild-to-moderate stenosis in the right M1 segment and multifocal poor perfusion in  several right M2 and M3 branches. Additional mild irregularity in the right M2 branches and distal ACAs. Additional poor signal in the proximal right P2 may be artifactual. If there is a clinical indication for more definitive evaluation of the intracranial vasculature, a CTA of the head is recommended. Impression #1 will be called to the ordering clinician or representative by the Radiologist Assistant, and communication documented in the PACS or [employer]. Electronically Signed   By: Merilyn Baba M.D.   On: 12/28/2021 19:29   CT HEAD WO CONTRAST (5MM)  Result Date: 12/28/2021 CLINICAL DATA:  Provided history: Headache, new or worsening. Additional history provided: New onset headache top of head. Patient feels as though she has a fever. EXAM: CT HEAD WITHOUT CONTRAST TECHNIQUE: Contiguous axial images were obtained from the base of the skull through the vertex without intravenous contrast. RADIATION DOSE REDUCTION: This exam was performed according to the departmental dose-optimization program which includes automated exposure control, adjustment of the mA and/or kV according to patient size and/or use of iterative reconstruction technique. COMPARISON:  No pertinent prior exams available for comparison. FINDINGS: Brain: Mild generalized parenchymal atrophy. Moderate-sized region  of cortical/subcortical edema within the right temporal and parietal lobes. Lacunar infarct within the left basal ganglia. Background advanced patchy and confluent hypoattenuation within the cerebral white matter, nonspecific but compatible with chronic small vessel ischemic disease. There is no acute intracranial hemorrhage. No extra-axial fluid collection. No midline shift. Vascular: No hyperdense vessel. Atherosclerotic calcifications. Skull: No fracture or aggressive osseous lesion. Sinuses/Orbits: No mass or acute finding within the imaged orbits. 11 mm mucous retention cyst within the left sphenoid sinus. These results  will be called to the ordering clinician or representative by the Radiologist Assistant, and communication documented in the PACS or [employer]. IMPRESSION: Moderate-sized region of cortical/subcortical edema within the right temporal and parietal lobes, indeterminate. Primary differential considerations include an acute/early subacute infarct, cerebritis or edema related to an underlying mass. A brain MRI without and with contrast is recommended for further evaluation. 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 Background advanced chronic small vessel image changes within the cerebral white matter. Mild generalized parenchymal atrophy. 11 mm left sphenoid sinus mucous retention cyst. Electronically Signed   By: Kellie Simmering D.O.   On: 12/28/2021 11:05    Scheduled Meds:  aspirin  81 mg Oral Daily   atorvastatin  40 mg Oral Daily   citalopram  10 mg Oral Daily   clopidogrel  75 mg Oral Daily   diltiazem  180 mg Oral Daily   gabapentin  100 mg Oral QHS   heparin  5,000 Units Subcutaneous Q8H   hydrALAZINE  25 mg Oral Q8H   lisinopril  20 mg Oral Daily   And   hydrochlorothiazide  12.5 mg Oral Daily   insulin aspart  0-9 Units Subcutaneous TID WC   levothyroxine  50 mcg Oral Daily   metoprolol succinate  25 mg Oral Daily   pantoprazole  40 mg Oral Daily   simethicone  80 mg Oral QID   Continuous Infusions:   LOS: 4 days   Time spent: 35 mins  Keefe Zawistowski Wynetta Emery, MD How to contact the [facility] Attending or Consulting provider [place] or covering provider during after hours [place], for this patient?  Check the care team in [facility] and look for a) attending/consulting [facility] provider listed and b) the [facility] team listed Log into [email or web address] and use [place]'s universal password to access. If you do not have the password, please contact the hospital operator. Locate the [facility] provider you are looking for under [facility] and page to a number that you can be directly reached. If  you still have difficulty reaching the provider, please page the [facility] [place] (Director on Call) for the Hospitalists listed on amion for assistance.  12/29/2021, 12:50 PM

## 2021-12-29 NOTE — Progress Notes (Signed)
PT Cancellation Note  Patient Details Name: [name] MRN: [id number] DOB: 04-29-1946   Cancelled Treatment:    Reason Eval/Treat Not Completed: Patient's level of consciousness.  PT is sleeping and is not easily aroused at this time.   Rayetta Humphrey, PT CLT [phone]  12/29/2021, 3:57 PM

## 2021-12-29 NOTE — Plan of Care (Signed)
  Problem: Education: Goal: Knowledge of disease or condition will improve Outcome: Progressing   Problem: Coping: Goal: Will verbalize positive feelings about self Outcome: Progressing   Problem: Intracerebral Hemorrhage Tissue Perfusion: Goal: Complications of Intracerebral Hemorrhage will be minimized Outcome: Progressing

## 2021-12-29 NOTE — Progress Notes (Signed)
4 Days Post-Op  Subjective: Patient states she had some leakage from her incision.  She is moving her bowels and tolerating a full liquid diet.  Objective: Vital signs in last 24 hours: Temp:  [98 F (36.7 C)-98.6 F (37 C)] 98.3 F (36.8 C) (08/08 0328) Pulse Rate:  [65-88] 65 (08/08 0328) Resp:  [16-20] 20 (08/08 0328) BP: (147-159)/(52-66) 157/52 (08/08 0328) SpO2:  [91 %-93 %] 91 % (08/08 0328) Last BM Date : 12/28/21  Intake/Output from previous day: 08/07 0701 - 08/08 0700 In: 780 [P.O.:780] Out: -  Intake/Output this shift: No intake/output data recorded.  General appearance: alert, cooperative, and no distress GI: Soft, incision healing well.  Resolving ecchymosis present.  No purulent drainage noted.  Bowel sounds active.  Lab Results:  Recent Labs    12/28/21 0357 12/29/21 0353  WBC 16.5* 10.0  HGB 16.1* 14.5  HCT 51.3* 46.7*  PLT 434* 382   BMET Recent Labs    12/28/21 0756 12/29/21 0353  NA 137 139  K 4.4 3.8  CL 107 108  CO2 20* 23  GLUCOSE 141* 135*  BUN 24* 26*  CREATININE 1.55* 1.58*  CALCIUM 8.9 8.8*   PT/INR No results for input(s): "LABPROT", "INR" in the last 72 hours.  Studies/Results: MR BRAIN W WO CONTRAST  Result Date: 12/28/2021 CLINICAL DATA:  Headache, new or worsening, concern for neoplasm on CT EXAM: MRI HEAD WITHOUT AND WITH CONTRAST MRA HEAD WITHOUT CONTRAST TECHNIQUE: Multiplanar, multi-echo pulse sequences of the brain and surrounding structures were acquired without and with intravenous contrast. Angiographic images of the Circle of Willis were acquired using MRA technique without intravenous contrast. CONTRAST:  27m GADAVIST GADOBUTROL 1 MMOL/ML IV SOLN COMPARISON:  No prior MRI, correlation is made with CT head 12/28/2021 FINDINGS: MRI HEAD FINDINGS Brain: Large area of restricted diffusion with ADC correlate in the right temporal lobe, anterior occipital lobe, and anteroinferior parietal lobe (series 5, images 12-22),  concerning for acute and/or subacute infarct. This area is associated with intrinsically T1 hyperintense and T2 hypointense material (series 18, image 28 and series 15, image 9), likely early subacute petechial hemorrhage, with some additional T1 isointense and T2 hyperintense material, likely more acute petechial hemorrhage, as well as contrast enhancement in this area (series 19, image 22), which suggests that the infarct is subacute. No definite enhancing mass in this area. Increased T2 signal throughout this area, with gyral swelling. No acute hemorrhage, mass, mass effect, or midline shift. No hydrocephalus or additional extra-axial collection. Vascular: Normal arterial flow voids. Normal arterial and venous enhancement. Skull and upper cervical spine: Normal marrow signal. Sinuses/Orbits: Mucous retention cyst in the left sphenoid sinus. Status post bilateral lens replacements. Other: The mastoids are well aerated. MRA HEAD FINDINGS Evaluation is somewhat limited by motion artifact. Anterior circulation: Both internal carotid arteries are patent to the termini, with mild narrowing in the right distal petrous segment and right cavernous segment. Patent left A1. Stenosed or aplastic right A1. Anterior cerebral arteries demonstrate multifocal irregularity but are patent to their distal aspects. Mild-to-moderate stenosis in the right M1 segment (series 10, images 95, 99, 101). Poor perfusion in several M2 and M3 branches (series 10, image 114 138, 145). No stenosis or occlusion in the left M1 segment. Mild irregularity in the right M2 branches (series 10, image 96, 112). Distal MCA branches appear perfused. Posterior circulation: Distal vertebral arteries patent to the vertebrobasilar junction without stenosis. Basilar patent to its distal aspect. Superior cerebellar arteries patent bilaterally.  Patent left P1. Fetal origin of the right PCA from a prominent right posterior communicating artery. Poor signal in the  proximal right P2 (series 10, image 82), which may be artifactual. PCAs otherwise perfused to their distal aspects without stenosis. The left posterior communicating artery is not visualized. Anatomic variants: Fetal origin of the right PCA. IMPRESSION: 1. 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 2. Evaluation of the intracranial vasculature on MRA is limited motion artifact. Within this limitation, there is mild-to-moderate stenosis in the right M1 segment and multifocal poor perfusion in several right M2 and M3 branches. Additional mild irregularity in the right M2 branches and distal ACAs. Additional poor signal in the proximal right P2 may be artifactual. If there is a clinical indication for more definitive evaluation of the intracranial vasculature, a CTA of the head is recommended. Impression #1 will be called to the ordering clinician or representative by the Radiologist Assistant, and communication documented in the PACS or [employer]. Electronically Signed   By: Merilyn Baba M.D.   On: 12/28/2021 19:29   MR ANGIO HEAD WO CONTRAST  Result Date: 12/28/2021 CLINICAL DATA:  Headache, new or worsening, concern for neoplasm on CT EXAM: MRI HEAD WITHOUT AND WITH CONTRAST MRA HEAD WITHOUT CONTRAST TECHNIQUE: Multiplanar, multi-echo pulse sequences of the brain and surrounding structures were acquired without and with intravenous contrast. Angiographic images of the Circle of Willis were acquired using MRA technique without intravenous contrast. CONTRAST:  60m GADAVIST GADOBUTROL 1 MMOL/ML IV SOLN COMPARISON:  No prior MRI, correlation is made with CT head 12/28/2021 FINDINGS: MRI HEAD FINDINGS Brain: Large area of restricted diffusion with ADC correlate in the right temporal lobe,  anterior occipital lobe, and anteroinferior parietal lobe (series 5, images 12-22), concerning for acute and/or subacute infarct. This area is associated with intrinsically T1 hyperintense and T2 hypointense material (series 18, image 28 and series 15, image 9), likely early subacute petechial hemorrhage, with some additional T1 isointense and T2 hyperintense material, likely more acute petechial hemorrhage, as well as contrast enhancement in this area (series 19, image 22), which suggests that the infarct is subacute. No definite enhancing mass in this area. Increased T2 signal throughout this area, with gyral swelling. No acute hemorrhage, mass, mass effect, or midline shift. No hydrocephalus or additional extra-axial collection. Vascular: Normal arterial flow voids. Normal arterial and venous enhancement. Skull and upper cervical spine: Normal marrow signal. Sinuses/Orbits: Mucous retention cyst in the left sphenoid sinus. Status post bilateral lens replacements. Other: The mastoids are well aerated. MRA HEAD FINDINGS Evaluation is somewhat limited by motion artifact. Anterior circulation: Both internal carotid arteries are patent to the termini, with mild narrowing in the right distal petrous segment and right cavernous segment. Patent left A1. Stenosed or aplastic right A1. Anterior cerebral arteries demonstrate multifocal irregularity but are patent to their distal aspects. Mild-to-moderate stenosis in the right M1 segment (series 10, images 95, 99, 101). Poor perfusion in several M2 and M3 branches (series 10, image 114 138, 145). No stenosis or occlusion in the left M1 segment. Mild irregularity in the right M2 branches (series 10, image 96, 112). Distal MCA branches appear perfused. Posterior circulation: Distal vertebral arteries patent to the vertebrobasilar junction without stenosis. Basilar patent to its distal aspect. Superior cerebellar arteries patent bilaterally. Patent left P1. Fetal origin of  the  right PCA from a prominent right posterior communicating artery. Poor signal in the proximal right P2 (series 10, image 82), which may be artifactual. PCAs otherwise perfused to their distal aspects without stenosis. The left posterior communicating artery is not visualized. Anatomic variants: Fetal origin of the right PCA. IMPRESSION: 1. 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 2. Evaluation of the intracranial vasculature on MRA is limited motion artifact. Within this limitation, there is mild-to-moderate stenosis in the right M1 segment and multifocal poor perfusion in several right M2 and M3 branches. Additional mild irregularity in the right M2 branches and distal ACAs. Additional poor signal in the proximal right P2 may be artifactual. If there is a clinical indication for more definitive evaluation of the intracranial vasculature, a CTA of the head is recommended. Impression #1 will be called to the ordering clinician or representative by the Radiologist Assistant, and communication documented in the PACS or [employer]. Electronically Signed   By: Merilyn Baba M.D.   On: 12/28/2021 19:29   CT HEAD WO CONTRAST (5MM)  Result Date: 12/28/2021 CLINICAL DATA:  Provided history: Headache, new or worsening. Additional history provided: New onset headache top of head. Patient feels as though she has a fever. EXAM: CT HEAD WITHOUT CONTRAST TECHNIQUE: Contiguous axial images were obtained from the base of the skull through the vertex without intravenous contrast. RADIATION DOSE REDUCTION: This exam was performed according to the departmental dose-optimization program which includes automated exposure control, adjustment of the mA and/or kV according to patient size and/or use of  iterative reconstruction technique. COMPARISON:  No pertinent prior exams available for comparison. FINDINGS: Brain: Mild generalized parenchymal atrophy. Moderate-sized region of cortical/subcortical edema within the right temporal and parietal lobes. Lacunar infarct within the left basal ganglia. Background advanced patchy and confluent hypoattenuation within the cerebral white matter, nonspecific but compatible with chronic small vessel ischemic disease. There is no acute intracranial hemorrhage. No extra-axial fluid collection. No midline shift. Vascular: No hyperdense vessel. Atherosclerotic calcifications. Skull: No fracture or aggressive osseous lesion. Sinuses/Orbits: No mass or acute finding within the imaged orbits. 11 mm mucous retention cyst within the left sphenoid sinus. These results will be called to the ordering clinician or representative by the Radiologist Assistant, and communication documented in the PACS or [employer]. IMPRESSION: Moderate-sized region of cortical/subcortical edema within the right temporal and parietal lobes, indeterminate. Primary differential considerations include an acute/early subacute infarct, cerebritis or edema related to an underlying mass. A brain MRI without and with contrast is recommended for further evaluation. 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 Background advanced chronic small vessel image changes within the cerebral white matter. Mild generalized parenchymal atrophy. 11 mm left sphenoid sinus mucous retention cyst. Electronically Signed   By: Kellie Simmering D.O.   On: 12/28/2021 11:05   DG Abd 1 View  Result Date: 12/27/2021 CLINICAL DATA:  Nausea with right lower abdominal pain. Recent hernia repair. EXAM: ABDOMEN - 1 VIEW COMPARISON:  Abdominopelvic CT 12/25/2021. FINDINGS: 1036 hours. Two views are submitted. Portions of the left abdomen are excluded from this portable study. The visualized bowel gas pattern is nonobstructive. No  supine evidence of free intraperitoneal air. Cholecystectomy clips and diffuse aortic atherosclerosis are noted. IMPRESSION: No evidence of acute abdominal process on supine radiography. Electronically Signed   By:  Richardean Sale M.D.   On: 12/27/2021 12:09    Anti-infectives: Anti-infectives (From admission, onward)    Start     Dose/Rate Route Frequency Ordered Stop   12/25/21 1645  ceFAZolin (ANCEF) IVPB 2g/100 mL premix        2 g 200 mL/hr over 30 Minutes Intravenous  Once 12/25/21 1642 12/25/21 1811       Assessment/Plan: s/p Procedure(s): HERNIA REPAIR INCISIONAL Impression: Patient's bowel function has returned.  Her white blood cell count is within normal limits.  She does have new head CT findings, concerning for a subacute temporal/parietal infarct.  Neurology consultation is pending.  Should the patient require anticoagulation, that would be okay from the surgery standpoint.  We will advance her diet as tolerated.  LOS: 4 days    Aviva Signs 12/29/2021

## 2021-12-30 DIAGNOSIS — G250 Essential tremor: Secondary | ICD-10-CM

## 2021-12-30 LAB — GLUCOSE, CAPILLARY
Glucose-Capillary: 193 mg/dL — ABNORMAL HIGH (ref 70–99)
Glucose-Capillary: 237 mg/dL — ABNORMAL HIGH (ref 70–99)

## 2021-12-30 MED ORDER — HYDRALAZINE HCL 50 MG PO TABS: 50.0000 mg | ORAL_TABLET | Freq: Three times a day (TID) | ORAL | 1 refills | Status: AC

## 2021-12-30 MED ORDER — METOPROLOL SUCCINATE ER 50 MG PO TB24: 50.0000 mg | ORAL_TABLET | Freq: Every day | ORAL | 1 refills | Status: DC

## 2021-12-30 MED ORDER — ASPIRIN 81 MG PO CHEW: 81.0000 mg | CHEWABLE_TABLET | Freq: Every day | ORAL | Status: AC

## 2021-12-30 MED ORDER — ATORVASTATIN CALCIUM 40 MG PO TABS: 40.0000 mg | ORAL_TABLET | Freq: Every day | ORAL | 1 refills | Status: AC

## 2021-12-30 MED ORDER — CLOPIDOGREL BISULFATE 75 MG PO TABS: 75.0000 mg | ORAL_TABLET | Freq: Every day | ORAL | 2 refills | Status: DC

## 2021-12-30 NOTE — Evaluation (Signed)
Clinical/Bedside Swallow Evaluation Patient Details  Name: [name] MRN: [id number] Date of Birth: 06-Oct-1945  Today's Date: 12/30/2021 Time: SLP Start Time (ACUTE ONLY): 6812 SLP Stop Time (ACUTE ONLY): 7517 SLP Time Calculation (min) (ACUTE ONLY): 19 min  Past Medical History:  Past Medical History:  Diagnosis Date   Anxiety    Benign essential tremor    Cardiac murmur    Reportedly for years-mild   Chronic diarrhea    Degenerative joint disease    Diabetes mellitus    Diabetic neuropathy (HCC)    Diverticulosis    Essential hypertension, benign    GERD (gastroesophageal reflux disease)    GI bleed    NSAID use   PONV (postoperative nausea and vomiting)    Seasonal allergies    Shortness of breath    TIA (transient ischemic attack)    1998   Type 2 diabetes mellitus ([place])    Urinary incontinence    Past Surgical History:  Past Surgical History:  Procedure Laterality Date   ABDOMINAL HYSTERECTOMY     APPENDECTOMY     Arthroscopic left shoulder surgery     BREAST SURGERY     lumpectomy X 2-benign   CARPAL TUNNEL RELEASE Left 10/03/2012   Procedure: CARPAL TUNNEL RELEASE;  Surgeon: Cammie Sickle., MD;  Location: [facility];  Service: Orthopedics;  Laterality: Left;   CHOLECYSTECTOMY     COLON SURGERY     COLONOSCOPY  03/03/09   COLONOSCOPY  09/16/2004   ROURK   Colostomy with reversal     ESOPHAGOGASTRODUODENOSCOPY     EGD 06/08/2008   ESOPHAGOGASTRODUODENOSCOPY  09/16/04   DIAG EGD W/TCS   INCISIONAL HERNIA REPAIR N/A 12/25/2021   Procedure: HERNIA REPAIR INCISIONAL;  Surgeon: Benjamine Sprague, DO;  Location: [facility];  Service: General;  Laterality: N/A;   REPLACEMENT TOTAL KNEE BILATERAL  2001, 2005   Right elbow surgery     TENDON RECONSTRUCTION Left 10/03/2012   Procedure: LEFT MEDIAL ELBOW RECONSTRUCTION;  Surgeon: Cammie Sickle., MD;  Location: [facility];  Service: Orthopedics;  Laterality: Left;   TONSILLECTOMY      HPI:  [name] is a 76 y.o. female with past medical history of TIA, hypertension, diabetes, diverticulosis, GI bleed, neuropathy who initially presented on 12/25/2021 with abdominal pain.  Patient was evaluated by general surgery and diagnosed with right lower quadrant incisional hernia concerning for extenuation s/p open incisional hernia repair with medium bard plug and patch mesh on 12/25/2021.  Postop, patient reported headache which did not improve with Tylenol and tramadol.  Her systolic blood pressure was noted to be elevated ranging from 150 to 195 (on arrival blood pressure was 197/83).  CT head was ordered on 12/28/2021 which showed moderate-sized region of cortical/subcortical edema within the right temporal and parietal lobes, indeterminate.  MRI brain with and without contrast was obtained subsequently which showed moderate-to-large infarct in the right MCA territory involving the right temporal lobe, anterior occipital lobe, and anteroinferior parietal lobe, which is likely subacute given associated acute to early subacute petechial hemorrhage and areas of contrast enhancement. BSE/SLE requested.    Assessment / Plan / Recommendation  Clinical Impression  Clinical swallow evaluation completed at bedside. Oral motor examination is WNL. Pt assessed with regular textures and thin lquids and exhibits no overt signs or symptoms of aspiration or reports of globus. Recommed regular textures and thin liquids with standard aspiration and reflux precautions. SLP will follow for SLE.  SLP Visit Diagnosis: Dysphagia, unspecified (R13.10)    Aspiration Risk  No limitations    Diet Recommendation Regular;Thin liquid   Liquid Administration via: Cup;Straw Medication Administration: Whole meds with liquid Supervision: Patient able to self feed Postural Changes: Seated upright at 90 degrees;Remain upright for at least 30 minutes after po intake    Other  Recommendations Oral Care  Recommendations: Oral care BID Other Recommendations: Clarify dietary restrictions    Recommendations for follow up therapy are one component of a multi-disciplinary discharge planning process, led by the attending physician.  Recommendations may be updated based on patient status, additional functional criteria and insurance authorization.  Follow up Recommendations No SLP follow up      Assistance Recommended at Discharge None  Functional Status Assessment Patient has not had a recent decline in their functional status  Frequency and Duration            Prognosis Prognosis for Safe Diet Advancement: Good      Swallow Study   General Date of Onset: 12/29/21 HPI: [name] is a 76 y.o. female with past medical history of TIA, hypertension, diabetes, diverticulosis, GI bleed, neuropathy who initially presented on 12/25/2021 with abdominal pain.  Patient was evaluated by general surgery and diagnosed with right lower quadrant incisional hernia concerning for extenuation s/p open incisional hernia repair with medium bard plug and patch mesh on 12/25/2021.  Postop, patient reported headache which did not improve with Tylenol and tramadol.  Her systolic blood pressure was noted to be elevated ranging from 150 to 195 (on arrival blood pressure was 197/83).  CT head was ordered on 12/28/2021 which showed moderate-sized region of cortical/subcortical edema within the right temporal and parietal lobes, indeterminate.  MRI brain with and without contrast was obtained subsequently which showed moderate-to-large infarct in the right MCA territory involving the right temporal lobe, anterior occipital lobe, and anteroinferior parietal lobe, which is likely subacute given associated acute to early subacute petechial hemorrhage and areas of contrast enhancement. BSE/SLE requested. Type of Study: Bedside Swallow Evaluation Diet Prior to this Study: Dysphagia 3 (soft);Thin liquids Temperature Spikes Noted:  No Respiratory Status: Room air History of Recent Intubation: Yes Behavior/Cognition: Alert;Pleasant mood;Cooperative Oral Cavity Assessment: Within Functional Limits Oral Care Completed by SLP: Recent completion by staff Oral Cavity - Dentition: Adequate natural dentition Vision: Functional for self-feeding Self-Feeding Abilities: Able to feed self (Pt has bilateral UE tremor) Patient Positioning: Upright in bed Baseline Vocal Quality: Normal Volitional Cough: Strong Volitional Swallow: Able to elicit    Oral/Motor/Sensory Function Overall Oral Motor/Sensory Function: Within functional limits   Ice Chips Ice chips: Not tested   Thin Liquid Thin Liquid: Within functional limits Presentation: Cup;Self Fed;Straw    Nectar Thick Nectar Thick Liquid: Not tested   Honey Thick Honey Thick Liquid: Not tested   Puree Puree: Within functional limits Presentation: Spoon   Solid     Solid: Within functional limits Presentation: Self Fed     Thank you,  Genene Churn, [place]  Kayton Dunaj 12/30/2021,10:23 AM

## 2021-12-30 NOTE — TOC Transition Note (Signed)
Transition of Care [facility]) - CM/SW Discharge Note   Patient Details  Name: SHERRINE SALBERG MRN: [id number] Date of Birth: [birth year]  [facility] CM/SW Contact:  Iona Beard, [place] Phone Number: 12/30/2021, 12:03 PM   Clinical Narrative:    TOC updated that PT is still recommending HH PT for pt at D/C. CSW updated Vaughan Basta with [facility] of plan for D/C today. HH orders are in. TOC signing off.   Final next level of care: [place] Barriers to Discharge: Barriers Resolved   Patient Goals and CMS Choice Patient states their goals for this hospitalization and ongoing recovery are:: go home CMS Medicare.gov Compare Post Acute Care list provided to:: Patient Choice offered to / list presented to : Patient  Discharge Placement                       Discharge Plan and Services In-house Referral: Clinical Social Work   Post Acute Care Choice: Home Health                    HH Arranged: PT [facility] Agency: [place] ([place]) Date [place]: 12/28/21   Representative spoke with at [place]: [place] ([place]) Interventions     Readmission Risk Interventions    12/28/2021   12:58 PM  Readmission Risk Prevention Plan  Transportation Screening Complete  Home Care Screening Complete  Medication Review (RN CM) Complete

## 2021-12-30 NOTE — Progress Notes (Addendum)
Physical Therapy Treatment Patient Details Name: [name] MRN: [id number] DOB: [birth year] Today's Date: 12/30/2021   History of Present Illness [name]  is a 76 y.o. female, s/p Open incisional hernia repair with medium bard plug and patch mesh on 12/25/21 with past medical history of hypertension, diabetes mellitus, neuropathy, GI bleed, TIA, diverticulosis, irritable bowel syndrome patient presents to ED secondary to complaints of abdominal pain.  -Reports abdominal pain x2 weeks, report diffuse abdominal pain, seen by her PCP recently where she was given Augmentin for presumed diverticulitis, initially improved, but then got worse, she had CT abdomen pelvis a week ago, which was unremarkable, and was sent today by her PCP due to worsening abdominal pain, she reports diarrhea that started to develop today, reports nausea, she had an episode of vomiting while in ED, denies fever, chills, and urinary symptoms, no chest pain or shortness of breath.    PT Comments    REASSESSMENT:  Patient c/o light sensitivity since finding of possible CVA (see diagnostics), otherwise no change in bilateral UE/LE strength and demonstrates good return for bed mobility, transfers and increased endurance/distance for gait training in hallway using RW without loss of balance.   Patient tolerated sitting up in chair after therapy to eat lunch with family members present in room.  Patient will benefit from continued skilled physical therapy in hospital and recommended venue below to increase strength, balance, endurance for safe ADLs and gait.    Recommendations for follow up therapy are one component of a multi-disciplinary discharge planning process, led by the attending physician.  Recommendations may be updated based on patient status, additional functional criteria and insurance authorization.  Follow Up Recommendations  Home health PT     Assistance Recommended at Discharge    Patient can return home  with the following A little help with walking and/or transfers;A little help with bathing/dressing/bathroom;Help with stairs or ramp for entrance;Assistance with cooking/housework   Equipment Recommendations  None recommended by PT    Recommendations for Other Services       Precautions / Restrictions Precautions Precautions: Fall Restrictions Weight Bearing Restrictions: No     Mobility  Bed Mobility Overal bed mobility: Modified Independent                  Transfers Overall transfer level: Needs assistance Equipment used: Rolling walker (2 wheels), Straight cane Transfers: Sit to/from Stand, Bed to chair/wheelchair/BSC Sit to Stand: Modified independent (Device/Increase time), Supervision   Step pivot transfers: Modified independent (Device/Increase time), Supervision       General transfer comment: good return for transferring using SPC and RW    Ambulation/Gait Ambulation/Gait assistance: Modified independent (Device/Increase time), Supervision Gait Distance (Feet): 100 Feet Assistive device: Rolling walker (2 wheels) Gait Pattern/deviations: Decreased step length - right, Decreased step length - left, Decreased stride length Gait velocity: decreased     General Gait Details: demonstrates increased endurance/distance for gait training with slightly labored cadence, no loss of balance   Stairs             Wheelchair Mobility    Modified Rankin (Stroke Patients Only)       Balance Overall balance assessment: Needs assistance Sitting-balance support: Feet supported, No upper extremity supported Sitting balance-Leahy Scale: Good Sitting balance - Comments: seated at EOB   Standing balance support: During functional activity, Single extremity supported Standing balance-Leahy Scale: Fair Standing balance comment: fair/good using RW, fair using single point cane  Cognition Arousal/Alertness:  Awake/alert Behavior During Therapy: WFL for tasks assessed/performed Overall Cognitive Status: Within Functional Limits for tasks assessed                                          Exercises      General Comments        Pertinent Vitals/Pain Pain Assessment Pain Assessment: Faces Faces Pain Scale: Hurts a little bit Pain Location: bilateral shoulders when lifting overhead Pain Descriptors / Indicators: Sore, Discomfort Pain Intervention(s): Limited activity within patient's tolerance, Monitored during session, Repositioned    Home Living     Available Help at Discharge: Family;Available 24 hours/day Type of Home: House                  Prior Function            PT Goals (current goals can now be found in the care plan section) Acute Rehab PT Goals Patient Stated Goal: return home with family to assist PT Goal Formulation: With patient/family Time For Goal Achievement: 12/30/21 Potential to Achieve Goals: Good Progress towards PT goals: Progressing toward goals    Frequency    Min 3X/week      PT Plan Current plan remains appropriate    Co-evaluation              AM-PAC PT "6 Clicks" Mobility   Outcome Measure  Help needed turning from your back to your side while in a flat bed without using bedrails?: None Help needed moving from lying on your back to sitting on the side of a flat bed without using bedrails?: None Help needed moving to and from a bed to a chair (including a wheelchair)?: A Little Help needed standing up from a chair using your arms (e.g., wheelchair or bedside chair)?: None Help needed to walk in hospital room?: A Little Help needed climbing 3-5 steps with a railing? : A Little 6 Click Score: 21    End of Session   Activity Tolerance: Patient tolerated treatment well;Patient limited by fatigue Patient left: in chair;with call bell/phone within reach Nurse Communication: Mobility status PT Visit  Diagnosis: Unsteadiness on feet (R26.81);Other abnormalities of gait and mobility (R26.89);Muscle weakness (generalized) (M62.81)     Time: 2671-2458 PT Time Calculation (min) (ACUTE ONLY): 20 min  Charges:  $Therapeutic Activity: 8-22 mins                     12:14 PM, 12/30/21 Lonell Grandchild, MPT Physical Therapist with [facility] [phone] office [phone] mobile phone

## 2021-12-30 NOTE — Progress Notes (Signed)
Patient has slept well during shift.  Patient has had no new complaints.  Patient has ambulated to bathroom well with walker x 2.  Patient states she hopes she is discharged soon.

## 2021-12-30 NOTE — Progress Notes (Signed)
Nsg Discharge Note  Admit Date:  12/25/2021 Discharge date: 12/30/2021   [name] to be D/C'd Home per MD order.  AVS completed.  Copy for chart, and copy for patient signed, and dated. Patient/caregiver able to verbalize understanding.  Discharge Medication: Allergies as of 12/30/2021       Reactions   Codeine Other (See Comments)   "Increases heart rate"   Morphine And Related Other (See Comments)   Hallucinations   Nsaids Other (See Comments)   Hx  GI bleed   Valium [diazepam] Other (See Comments)   Patient states that this medication made her want to commit sucide        Medication List     STOP taking these medications    citalopram 10 MG tablet Commonly known as: [place] PO   dicyclomine 20 MG tablet Commonly known as: BENTYL   diphenoxylate-atropine 2.5-0.025 MG tablet Commonly known as: LOMOTIL   gabapentin 100 MG capsule Commonly known as: NEURONTIN   Jardiance 25 MG Tabs tablet Generic drug: empagliflozin   lidocaine 4 % cream Commonly known as: LMX   lisinopril-hydrochlorothiazide 20-12.5 MG tablet Commonly known as: ZESTORETIC   loratadine 10 MG tablet Commonly known as: CLARITIN   lovastatin 20 MG tablet Commonly known as: MEVACOR   metFORMIN 500 MG tablet Commonly known as: GLUCOPHAGE   pioglitazone 15 MG tablet Commonly known as: ACTOS       TAKE these medications    aspirin 81 MG chewable tablet Chew 1 tablet (81 mg total) by mouth daily. Start taking on: December 31, 2021   atorvastatin 40 MG tablet Commonly known as: LIPITOR Take 1 tablet (40 mg total) by mouth daily. Start taking on: December 31, 2021   clonazePAM 0.5 MG tablet Commonly known as: KLONOPIN Take 0.5 mg by mouth 2 (two) times daily.   clopidogrel 75 MG tablet Commonly known as: PLAVIX Take 1 tablet (75 mg total) by mouth daily. Start taking on: December 31, 2021   diltiazem 180 MG 24 hr capsule Commonly known as: CARDIZEM  CD Take 180 mg by mouth daily.   ergocalciferol 1.25 MG (50000 UT) capsule Commonly known as: VITAMIN D2 Take 50,000 Units by mouth once a week.   glipiZIDE 5 MG 24 hr tablet Commonly known as: GLUCOTROL XL Take 10 mg by mouth daily.   hydrALAZINE 50 MG tablet Commonly known as: APRESOLINE Take 1 tablet (50 mg total) by mouth every 8 (eight) hours.   levothyroxine 50 MCG tablet Commonly known as: SYNTHROID Take 50 mcg by mouth daily.   metoprolol succinate 50 MG 24 hr tablet Commonly known as: TOPROL-XL Take 1 tablet (50 mg total) by mouth daily. Take with or immediately following a meal. Start taking on: December 31, 2021 What changed:  medication strength how much to take additional instructions   pantoprazole 40 MG tablet Commonly known as: PROTONIX TAKE 1 TABLET BY MOUTH EVERY DAY IN THE MORNING What changed: See the new instructions.   traMADol 50 MG tablet Commonly known as: ULTRAM Take 50 mg by mouth every 6 (six) hours as needed for moderate pain.        Discharge Assessment: Vitals:   12/30/21 0852 12/30/21 0853  BP: (!) 171/61   Pulse:  (!) 102  Resp:    Temp:    SpO2:     Skin clean, dry and intact without evidence of skin break down, no evidence of skin tears noted. IV catheter discontinued intact.  Site without signs and symptoms of complications - no redness or edema noted at insertion site, patient denies c/o pain - only slight tenderness at site.  Dressing with slight pressure applied.  D/c Instructions-Education: Discharge instructions given to patient/family with verbalized understanding. D/c education completed with patient/family including follow up instructions, medication list, d/c activities limitations if indicated, with other d/c instructions as indicated by MD - patient able to verbalize understanding, all questions fully answered. Patient instructed to return to ED, call 911, or call MD for any changes in condition.  Patient escorted via  [place], and D/C home via private auto.  Dorcas Mcmurray, LPN 08/25/345 4:25 PM

## 2021-12-30 NOTE — Evaluation (Signed)
Speech Language Pathology Evaluation Patient Details Name: [name] MRN: [id number] DOB: 10-09-1945 Today's Date: 12/30/2021 Time: 5400-8676 SLP Time Calculation (min) (ACUTE ONLY): 26 min  Problem List:  Patient Active Problem List   Diagnosis Date Noted   Ischemic stroke ([place]) 12/29/2021   AKI (acute kidney injury) ([place]) 12/25/2021   Abdominal pain 12/25/2021   Incarcerated hernia 12/25/2021   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   Diarrhea 10/16/2013   IBS (irritable bowel syndrome) 08/08/2012   GERD (gastroesophageal reflux disease) 08/08/2012   Tremor 08/05/2011   Dysphagia 08/05/2011   Shortness of breath 09/16/2010   Undiagnosed cardiac murmurs 09/16/2010   Essential hypertension, benign 09/16/2010   Diabetes mellitus ([place]) 09/16/2010   Past Medical History:  Past Medical History:  Diagnosis Date   Anxiety    Benign essential tremor    Cardiac murmur    Reportedly for years-mild   Chronic diarrhea    Degenerative joint disease    Diabetes mellitus    Diabetic neuropathy ([facility])    Diverticulosis    Essential hypertension, benign    GERD (gastroesophageal reflux disease)    GI bleed    NSAID use   PONV (postoperative nausea and vomiting)    Seasonal allergies    Shortness of breath    TIA (transient ischemic attack)    1998   Type 2 diabetes mellitus ([place])    Urinary incontinence    Past Surgical History:  Past Surgical History:  Procedure Laterality Date   ABDOMINAL HYSTERECTOMY     APPENDECTOMY     Arthroscopic left shoulder surgery     BREAST SURGERY     lumpectomy X 2-benign   CARPAL TUNNEL RELEASE Left 10/03/2012   Procedure: CARPAL TUNNEL RELEASE;  Surgeon: Cammie Sickle., MD;  Location: [facility];  Service: Orthopedics;  Laterality: Left;   CHOLECYSTECTOMY     COLON SURGERY     COLONOSCOPY  03/03/09   COLONOSCOPY  09/16/2004   ROURK   Colostomy with reversal     ESOPHAGOGASTRODUODENOSCOPY     EGD 06/08/2008    ESOPHAGOGASTRODUODENOSCOPY  09/16/04   DIAG EGD W/TCS   INCISIONAL HERNIA REPAIR N/A 12/25/2021   Procedure: HERNIA REPAIR INCISIONAL;  Surgeon: Benjamine Sprague, DO;  Location: [facility];  Service: General;  Laterality: N/A;   REPLACEMENT TOTAL KNEE BILATERAL  2001, 2005   Right elbow surgery     TENDON RECONSTRUCTION Left 10/03/2012   Procedure: LEFT MEDIAL ELBOW RECONSTRUCTION;  Surgeon: Cammie Sickle., MD;  Location: [facility];  Service: Orthopedics;  Laterality: Left;   TONSILLECTOMY     HPI:  [name] is a 76 y.o. female with past medical history of TIA, hypertension, diabetes, diverticulosis, GI bleed, neuropathy who initially presented on 12/25/2021 with abdominal pain.  Patient was evaluated by general surgery and diagnosed with right lower quadrant incisional hernia concerning for extenuation s/p open incisional hernia repair with medium bard plug and patch mesh on 12/25/2021.  Postop, patient reported headache which did not improve with Tylenol and tramadol.  Her systolic blood pressure was noted to be elevated ranging from 150 to 195 (on arrival blood pressure was 197/83).  CT head was ordered on 12/28/2021 which showed moderate-sized region of cortical/subcortical edema within the right temporal and parietal lobes, indeterminate.  MRI brain with and without contrast was obtained subsequently which showed moderate-to-large infarct in the right MCA territory involving the right temporal lobe, anterior occipital lobe, and anteroinferior parietal lobe, which  is likely subacute given associated acute to early subacute petechial hemorrhage and areas of contrast enhancement. BSE/SLE requested.   Assessment / Plan / Recommendation Clinical Impression  Pt presents with mild attention deficits which negatively impact working memory, however suspect this may be baseline per Pt and husband. She also presents with visual changes and reports "slurry" vision and reduced awareness to her  left. She benefited from verbal cue and visual cue (highlighted line on the left side of page for reading tasks) to compensate for visual changes. Pt without dysarthria or aphasia and generally reports feeling back to her baseline except for her visual changes. No further SLP services indicated at this time. SLP will sign off. Pt and spouse are in agreement with plan of care.    SLP Assessment  SLP Recommendation/Assessment: Patient does not need any further Speech [place] Services SLP Visit Diagnosis: Cognitive communication deficit (R41.841)    Recommendations for follow up therapy are one component of a multi-disciplinary discharge planning process, led by the attending physician.  Recommendations may be updated based on patient status, additional functional criteria and insurance authorization.    Follow Up Recommendations  No SLP follow up    Assistance Recommended at Discharge  Set up Supervision/Assistance  Functional Status Assessment Patient has not had a recent decline in their functional status  Frequency and Duration           SLP Evaluation Cognition  Overall Cognitive Status: Within Functional Limits for tasks assessed Arousal/Alertness: Awake/alert Orientation Level: Oriented X4 Year: 2023 Month: August Day of Week: Incorrect (Thursday vs Wednesday) Attention: Sustained Sustained Attention: Impaired Sustained Attention Impairment: Verbal basic (tangential) Memory: Appears intact (4/5 delayed word recall, 8/8 paragraph recall) Awareness: Appears intact Problem Solving: Appears intact Safety/Judgment: Appears intact       Comprehension  Auditory Comprehension Overall Auditory Comprehension: Appears within functional limits for tasks assessed Yes/No Questions: Within Functional Limits Commands: Within Functional Limits Conversation: Complex Interfering Components: Attention Visual Recognition/Discrimination Discrimination:  (Pt reports right eye  strain, reduced vision on her left) Reading Comprehension Reading Status: Impaired Word level: Within functional limits Sentence Level: Impaired (due to vision changes) Interfering Components: Visual scanning Effective Techniques: Visual cueing;Verbal cueing (higlighted line on her left)    Expression Expression Primary Mode of Expression: Verbal Verbal Expression Overall Verbal Expression: Appears within functional limits for tasks assessed Initiation: No impairment Automatic Speech: Name;Social Response Level of Generative/Spontaneous Verbalization: Conversation Repetition: No impairment Naming: No impairment Pragmatics: No impairment;Impairment Impairments: Topic maintenance (mildly tangential, possibly baseline) Interfering Components: Attention Non-Verbal Means of Communication: Not applicable Written Expression Dominant Hand: Right Written Expression:  (Pt with bilateral UE tremor)   Oral / Motor  Oral Motor/Sensory Function Overall Oral Motor/Sensory Function: Within functional limits Motor Speech Overall Motor Speech: Appears within functional limits for tasks assessed Respiration: Within functional limits Phonation: Normal Resonance: Within functional limits Articulation: Within functional limitis Intelligibility: Intelligible Motor Planning: Witnin functional limits Motor Speech Errors: Not applicable           Thank you,  Genene Churn, Mobile  Rexton Greulich 12/30/2021, 11:04 AM

## 2021-12-30 NOTE — Progress Notes (Signed)
5 Days Post-Op  Subjective: Patient has no acute complaints.  Is eating well as per patient.  Is having bowel movements.  No significant incisional pain noted.  Objective: Vital signs in last 24 hours: Temp:  [97.8 F (36.6 C)-97.9 F (36.6 C)] 97.9 F (36.6 C) (08/09 0325) Pulse Rate:  [81-102] 102 (08/09 0853) Resp:  [14-19] 14 (08/09 0325) BP: (163-187)/(61-87) 171/61 (08/09 0852) SpO2:  [94 %-96 %] 96 % (08/09 0325) Last BM Date : 12/29/21  Intake/Output from previous day: 08/08 0701 - 08/09 0700 In: 920 [P.O.:920] Out: -  Intake/Output this shift: Total I/O In: 240 [P.O.:240] Out: -   General appearance: alert, cooperative, and no distress GI: Soft, bowel sounds active.  Incision healing well.  No drainage noted.  Lab Results:  Recent Labs    12/28/21 0357 12/29/21 0353  WBC 16.5* 10.0  HGB 16.1* 14.5  HCT 51.3* 46.7*  PLT 434* 382   BMET Recent Labs    12/28/21 0756 12/29/21 0353  NA 137 139  K 4.4 3.8  CL 107 108  CO2 20* 23  GLUCOSE 141* 135*  BUN 24* 26*  CREATININE 1.55* 1.58*  CALCIUM 8.9 8.8*   PT/INR No results for input(s): "LABPROT", "INR" in the last 72 hours.  Studies/Results: ECHOCARDIOGRAM COMPLETE  Result Date: 12/29/2021    ECHOCARDIOGRAM REPORT   Patient Name:   JEANY SEVILLE Date of Exam: 12/29/2021 Medical Rec #:  [id number]        Height:       62.0 in Accession #:    [id number]       Weight:       179.5 lb Date of Birth:  02-17-1946         BSA:          1.826 m Patient Age:    76 years         BP:           145/65 mmHg Patient Gender: F                HR:           84 bpm. Exam Location:  [facility] Procedure: 2D Echo, Cardiac Doppler and Color Doppler Indications:    I63.9 Stroke  History:        Patient has no prior history of Echocardiogram examinations.                 Risk Factors:Hypertension and Diabetes.  Sonographer:    Alvino Chapel RCS Referring Phys: [place]  1. Left ventricular ejection  fraction, by estimation, is 60 to 65%. The left ventricle has normal function. The left ventricle has no regional wall motion abnormalities. There is moderate left ventricular hypertrophy. Left ventricular diastolic parameters are consistent with Grade I diastolic dysfunction (impaired relaxation).  2. Right ventricular systolic function is normal. The right ventricular size is normal. There is normal pulmonary artery systolic pressure.  3. Left atrial size was mildly dilated.  4. The mitral valve is normal in structure. No evidence of mitral valve regurgitation.  5. The aortic valve is tricuspid. Aortic valve regurgitation is not visualized. Aortic valve sclerosis/calcification is present, without any evidence of aortic stenosis.  6. The inferior vena cava is normal in size with greater than 50% respiratory variability, suggesting right atrial pressure of 3 mmHg. Comparison(s): No prior Echocardiogram. Conclusion(s)/Recommendation(s): Normal biventricular function without evidence of hemodynamically significant valvular heart disease. FINDINGS  Left Ventricle: Left  ventricular ejection fraction, by estimation, is 60 to 65%. The left ventricle has normal function. The left ventricle has no regional wall motion abnormalities. The left ventricular internal cavity size was normal in size. There is  moderate left ventricular hypertrophy. Left ventricular diastolic parameters are consistent with Grade I diastolic dysfunction (impaired relaxation). Right Ventricle: The right ventricular size is normal. No increase in right ventricular wall thickness. Right ventricular systolic function is normal. There is normal pulmonary artery systolic pressure. The tricuspid regurgitant velocity is 2.81 m/s, and  with an assumed right atrial pressure of 3 mmHg, the estimated right ventricular systolic pressure is 79.1 mmHg. Left Atrium: Left atrial size was mildly dilated. Right Atrium: Right atrial size was normal in size.  Pericardium: Trivial pericardial effusion is present. Mitral Valve: The mitral valve is normal in structure. Mild mitral annular calcification. No evidence of mitral valve regurgitation. Tricuspid Valve: The tricuspid valve is normal in structure. Tricuspid valve regurgitation is trivial. Aortic Valve: The aortic valve is tricuspid. Aortic valve regurgitation is not visualized. Aortic valve sclerosis/calcification is present, without any evidence of aortic stenosis. Pulmonic Valve: The pulmonic valve was not well visualized. Pulmonic valve regurgitation is not visualized. Aorta: The aortic root is normal in size and structure. Venous: The inferior vena cava is normal in size with greater than 50% respiratory variability, suggesting right atrial pressure of 3 mmHg. IAS/Shunts: No atrial level shunt detected by color flow Doppler.  LEFT VENTRICLE PLAX 2D LVIDd:         4.20 cm   Diastology LVIDs:         2.80 cm   LV e' medial:    4.90 cm/s LV PW:         1.60 cm   LV E/e' medial:  18.2 LV IVS:        1.30 cm   LV e' lateral:   6.74 cm/s LVOT diam:     1.80 cm   LV E/e' lateral: 13.2 LV SV:         63 LV SV Index:   34 LVOT Area:     2.54 cm  RIGHT VENTRICLE RV S prime:     9.36 cm/s TAPSE (M-mode): 2.5 cm LEFT ATRIUM             Index        RIGHT ATRIUM           Index LA diam:        3.80 cm 2.08 cm/m   RA Area:     14.80 cm LA Vol (A2C):   60.1 ml 32.92 ml/m  RA Volume:   35.30 ml  19.34 ml/m LA Vol (A4C):   60.0 ml 32.87 ml/m LA Biplane Vol: 61.1 ml 33.47 ml/m  AORTIC VALVE LVOT Vmax:   102.00 cm/s LVOT Vmean:  67.400 cm/s LVOT VTI:    0.246 m  AORTA Ao Root diam: 3.50 cm MITRAL VALVE                TRICUSPID VALVE MV Area (PHT): 3.21 cm     TR Peak grad:   31.6 mmHg MV Decel Time: 236 msec     TR Vmax:        281.00 cm/s MV E velocity: 89.30 cm/s MV A velocity: 147.00 cm/s  SHUNTS MV E/A ratio:  0.61         Systemic VTI:  0.25 m  Systemic Diam: 1.80 cm Phineas Inches  Electronically signed by Phineas Inches Signature Date/Time: 12/29/2021/3:08:31 PM    Final    US Carotid Bilateral  Result Date: 12/29/2021 CLINICAL DATA:  CVA.  Right MCA infarct. EXAM: BILATERAL CAROTID DUPLEX ULTRASOUND TECHNIQUE: Pearline Cables scale imaging, color Doppler and duplex ultrasound were performed of bilateral carotid and vertebral arteries in the neck. COMPARISON:  Brain MRI 12/28/2021 FINDINGS: Criteria: Quantification of carotid stenosis is based on velocity parameters that correlate the residual internal carotid diameter with NASCET-based stenosis levels, using the diameter of the distal internal carotid lumen as the denominator for stenosis measurement. The following velocity measurements were obtained: RIGHT ICA: 80/11 cm/sec CCA: 35/00 cm/sec SYSTOLIC ICA/CCA RATIO:  0.9 ECA: 287 cm/sec LEFT ICA: 125/25 cm/sec CCA: 93/81 cm/sec SYSTOLIC ICA/CCA RATIO:  1.9 ECA: 209 cm/sec RIGHT CAROTID ARTERY: Small amount of echogenic plaque at the carotid bulb. Right external carotid artery is patent with slightly elevated peak systolic velocity. Small amount of echogenic plaque in the proximal internal carotid artery. Normal waveforms and velocities in the internal carotid artery. RIGHT VERTEBRAL ARTERY: Antegrade flow and normal waveform in the right vertebral artery. LEFT CAROTID ARTERY: Small amount of plaque in the distal common carotid artery and bulb. External carotid artery is patent with normal waveform. Echogenic plaque in the proximal internal carotid artery. Normal waveforms and velocities in the internal carotid artery. LEFT VERTEBRAL ARTERY:  Not visualized and could be occluded. IMPRESSION: 1. Atherosclerotic plaque involving bilateral carotid arteries. Estimated degree of stenosis in the internal carotid arteries is less than 50% bilaterally. 2. Cannot exclude stenosis involving the right external carotid artery. 3. Left vertebral artery is not visualized. 4. Right vertebral artery is patent with  antegrade flow. Electronically Signed   By: Markus Daft M.D.   On: 12/29/2021 13:07   MR BRAIN W WO CONTRAST  Result Date: 12/28/2021 CLINICAL DATA:  Headache, new or worsening, concern for neoplasm on CT EXAM: MRI HEAD WITHOUT AND WITH CONTRAST MRA HEAD WITHOUT CONTRAST TECHNIQUE: Multiplanar, multi-echo pulse sequences of the brain and surrounding structures were acquired without and with intravenous contrast. Angiographic images of the Circle of Willis were acquired using MRA technique without intravenous contrast. CONTRAST:  31m GADAVIST GADOBUTROL 1 MMOL/ML IV SOLN COMPARISON:  No prior MRI, correlation is made with CT head 12/28/2021 FINDINGS: MRI HEAD FINDINGS Brain: Large area of restricted diffusion with ADC correlate in the right temporal lobe, anterior occipital lobe, and anteroinferior parietal lobe (series 5, images 12-22), concerning for acute and/or subacute infarct. This area is associated with intrinsically T1 hyperintense and T2 hypointense material (series 18, image 28 and series 15, image 9), likely early subacute petechial hemorrhage, with some additional T1 isointense and T2 hyperintense material, likely more acute petechial hemorrhage, as well as contrast enhancement in this area (series 19, image 22), which suggests that the infarct is subacute. No definite enhancing mass in this area. Increased T2 signal throughout this area, with gyral swelling. No acute hemorrhage, mass, mass effect, or midline shift. No hydrocephalus or additional extra-axial collection. Vascular: Normal arterial flow voids. Normal arterial and venous enhancement. Skull and upper cervical spine: Normal marrow signal. Sinuses/Orbits: Mucous retention cyst in the left sphenoid sinus. Status post bilateral lens replacements. Other: The mastoids are well aerated. MRA HEAD FINDINGS Evaluation is somewhat limited by motion artifact. Anterior circulation: Both internal carotid arteries are patent to the termini, with mild  narrowing in the right distal petrous segment and right cavernous segment. Patent left A1.  Stenosed or aplastic right A1. Anterior cerebral arteries demonstrate multifocal irregularity but are patent to their distal aspects. Mild-to-moderate stenosis in the right M1 segment (series 10, images 95, 99, 101). Poor perfusion in several M2 and M3 branches (series 10, image 114 138, 145). No stenosis or occlusion in the left M1 segment. Mild irregularity in the right M2 branches (series 10, image 96, 112). Distal MCA branches appear perfused. Posterior circulation: Distal vertebral arteries patent to the vertebrobasilar junction without stenosis. Basilar patent to its distal aspect. Superior cerebellar arteries patent bilaterally. Patent left P1. Fetal origin of the right PCA from a prominent right posterior communicating artery. Poor signal in the proximal right P2 (series 10, image 82), which may be artifactual. PCAs otherwise perfused to their distal aspects without stenosis. The left posterior communicating artery is not visualized. Anatomic variants: Fetal origin of the right PCA. IMPRESSION: 1. 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 2. Evaluation of the intracranial vasculature on MRA is limited motion artifact. Within this limitation, there is mild-to-moderate stenosis in the right M1 segment and multifocal poor perfusion in several right M2 and M3 branches. Additional mild irregularity in the right M2 branches and distal ACAs. Additional poor signal in the proximal right P2 may be artifactual. If there is a clinical indication for more definitive evaluation of the intracranial vasculature, a CTA of the head is recommended. Impression #1 will be called to the ordering clinician or  representative by the Radiologist Assistant, and communication documented in the PACS or [employer]. Electronically Signed   By: Merilyn Baba M.D.   On: 12/28/2021 19:29   MR ANGIO HEAD WO CONTRAST  Result Date: 12/28/2021 CLINICAL DATA:  Headache, new or worsening, concern for neoplasm on CT EXAM: MRI HEAD WITHOUT AND WITH CONTRAST MRA HEAD WITHOUT CONTRAST TECHNIQUE: Multiplanar, multi-echo pulse sequences of the brain and surrounding structures were acquired without and with intravenous contrast. Angiographic images of the Circle of Willis were acquired using MRA technique without intravenous contrast. CONTRAST:  61m GADAVIST GADOBUTROL 1 MMOL/ML IV SOLN COMPARISON:  No prior MRI, correlation is made with CT head 12/28/2021 FINDINGS: MRI HEAD FINDINGS Brain: Large area of restricted diffusion with ADC correlate in the right temporal lobe, anterior occipital lobe, and anteroinferior parietal lobe (series 5, images 12-22), concerning for acute and/or subacute infarct. This area is associated with intrinsically T1 hyperintense and T2 hypointense material (series 18, image 28 and series 15, image 9), likely early subacute petechial hemorrhage, with some additional T1 isointense and T2 hyperintense material, likely more acute petechial hemorrhage, as well as contrast enhancement in this area (series 19, image 22), which suggests that the infarct is subacute. No definite enhancing mass in this area. Increased T2 signal throughout this area, with gyral swelling. No acute hemorrhage, mass, mass effect, or midline shift. No hydrocephalus or additional extra-axial collection. Vascular: Normal arterial flow voids. Normal arterial and venous enhancement. Skull and upper cervical spine: Normal marrow signal. Sinuses/Orbits: Mucous retention cyst in the left sphenoid sinus. Status post bilateral lens replacements. Other: The mastoids are well aerated. MRA HEAD FINDINGS Evaluation is somewhat limited by motion  artifact. Anterior circulation: Both internal carotid arteries are patent to the termini, with mild narrowing in the right distal petrous segment and right cavernous segment. Patent left A1. Stenosed or aplastic right A1. Anterior  cerebral arteries demonstrate multifocal irregularity but are patent to their distal aspects. Mild-to-moderate stenosis in the right M1 segment (series 10, images 95, 99, 101). Poor perfusion in several M2 and M3 branches (series 10, image 114 138, 145). No stenosis or occlusion in the left M1 segment. Mild irregularity in the right M2 branches (series 10, image 96, 112). Distal MCA branches appear perfused. Posterior circulation: Distal vertebral arteries patent to the vertebrobasilar junction without stenosis. Basilar patent to its distal aspect. Superior cerebellar arteries patent bilaterally. Patent left P1. Fetal origin of the right PCA from a prominent right posterior communicating artery. Poor signal in the proximal right P2 (series 10, image 82), which may be artifactual. PCAs otherwise perfused to their distal aspects without stenosis. The left posterior communicating artery is not visualized. Anatomic variants: Fetal origin of the right PCA. IMPRESSION: 1. 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 2. Evaluation of the intracranial vasculature on MRA is limited motion artifact. Within this limitation, there is mild-to-moderate stenosis in the right M1 segment and multifocal poor perfusion in several right M2 and M3 branches. Additional mild irregularity in the right M2 branches and distal ACAs. Additional poor signal in the proximal right P2 may be artifactual. If there is a clinical indication for more definitive evaluation of the intracranial vasculature,  a CTA of the head is recommended. Impression #1 will be called to the ordering clinician or representative by the Radiologist Assistant, and communication documented in the PACS or [employer]. Electronically Signed   By: Merilyn Baba M.D.   On: 12/28/2021 19:29   CT HEAD WO CONTRAST (5MM)  Result Date: 12/28/2021 CLINICAL DATA:  Provided history: Headache, new or worsening. Additional history provided: New onset headache top of head. Patient feels as though she has a fever. EXAM: CT HEAD WITHOUT CONTRAST TECHNIQUE: Contiguous axial images were obtained from the base of the skull through the vertex without intravenous contrast. RADIATION DOSE REDUCTION: This exam was performed according to the departmental dose-optimization program which includes automated exposure control, adjustment of the mA and/or kV according to patient size and/or use of iterative reconstruction technique. COMPARISON:  No pertinent prior exams available for comparison. FINDINGS: Brain: Mild generalized parenchymal atrophy. Moderate-sized region of cortical/subcortical edema within the right temporal and parietal lobes. Lacunar infarct within the left basal ganglia. Background advanced patchy and confluent hypoattenuation within the cerebral white matter, nonspecific but compatible with chronic small vessel ischemic disease. There is no acute intracranial hemorrhage. No extra-axial fluid collection. No midline shift. Vascular: No hyperdense vessel. Atherosclerotic calcifications. Skull: No fracture or aggressive osseous lesion. Sinuses/Orbits: No mass or acute finding within the imaged orbits. 11 mm mucous retention cyst within the left sphenoid sinus. These results will be called to the ordering clinician or representative by the Radiologist Assistant, and communication documented in the PACS or [employer]. IMPRESSION: Moderate-sized region of cortical/subcortical edema within the right temporal and parietal lobes,  indeterminate. Primary differential considerations include an acute/early subacute infarct, cerebritis or edema related to an underlying mass. A brain MRI without and with contrast is recommended for further evaluation. 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 Background advanced chronic small vessel image changes within the cerebral white matter. Mild generalized parenchymal atrophy. 11 mm left sphenoid sinus mucous retention cyst. Electronically Signed   By: Kellie Simmering  D.O.   On: 12/28/2021 11:05    Anti-infectives: Anti-infectives (From admission, onward)    Start     Dose/Rate Route Frequency Ordered Stop   12/25/21 1645  ceFAZolin (ANCEF) IVPB 2g/100 mL premix        2 g 200 mL/hr over 30 Minutes Intravenous  Once 12/25/21 1642 12/25/21 1811       Assessment/Plan: s/p Procedure(s): HERNIA REPAIR INCISIONAL Impression: Bowel function has fully returned.  Patient is being treated for her right-sided cerebrovascular accident.  Okay to treat with Plavix and aspirin.  Okay for discharge from surgical standpoint, though this may be delayed due to consultations from physical therapy and speech therapy.  LOS: 5 days    Aviva Signs 12/30/2021

## 2021-12-30 NOTE — Discharge Summary (Addendum)
Physician Discharge Summary   Patient: [name] MRN: [id number] DOB: 1945/05/31  Admit date:     12/25/2021  Discharge date: 12/30/21  Discharge Physician: Shanon Brow Ibraheem Voris   PCP: Glenda Chroman, MD   Recommendations at discharge:   Please follow up with primary care provider within 1-2 weeks  Please repeat BMP and CBC in one week    Hospital Course: 76 y.o. female, with past medical history of hypertension, diabetes mellitus, neuropathy, GI bleed, TIA, diverticulosis, irritable bowel syndrome patient presents to ED secondary to complaints of abdominal pain. -Reports abdominal pain x2 weeks, report diffuse abdominal pain, seen by her PCP recently where she was given Augmentin for presumed diverticulitis, initially improved, but then got worse, she had CT abdomen pelvis a week ago, which was unremarkable, and was sent today by her PCP due to worsening abdominal pain, she reports diarrhea that started to develop today, reports nausea, she had an episode of vomiting while in ED, denies fever, chills, and urinary symptoms, no chest pain or shortness of breath. - in ED CT abdomen and pelvis showing ventral hernia with, labs were significant for dehydration with creatinine of 1.65, hemoglobin of 17, platelet count of 423 K, white blood cell count elevated at 11.8, patient was seen by general surgery with concern of incarcerated incisional hernia with recommendation for emergent surgery, [facility] hospitalist consulted to admit.  Assessment and Plan: Abdominal pain from incarcerated incisional hernia Post op s/p open incisional hernia repair  -Patient presented with progressive abdominal pain over the last few days. -General surgery input greatly appreciated,  patient be taken emergently to the OR on 12/25/21.--open incisonal hernia repair with bard plug and patch mesh -post op her diet was advanced gradually as her bowel function returned   CKD stage 3b  -initially thought AKI but no recent BMPs to  compare, given her poorly controlled hypertension and diabetes suspect patient has CKD3b.  -baseline creatinine 1.4-1.6  Headaches / Ischemic CVA  - because headaches persisted obtained a CT head without contrast to further investigate - CT findings suspicious for temporoparietal mass, ordered MRI brain w wo contrast positive for subacute right temporoparetal infarct of moderate size. I spoke with neurologist Dr. Hortense Ramal and requested consultation.   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 -Will hold restart glipizide after d/c -f/u PCP -12/25/21 A1C--8.0 -continue insulin sliding scale  CBG (last 3)  Recent Labs (last 2 labs)        Recent Labs    12/28/21 2127 12/29/21 0748 12/29/21 1131  GLUCAP 193* 164* 232*      GERD -Protonix 40 mg daily   Hyperlipidemia -started lipitor   Hypothyroidism -resumed home levothyroxine   Secondary polycythemia -Hg down to 14.5 today    Thrombocytosis -platelets down to 382  Essential Tremor -pt requesting referral for eval for DBS         Consultants: neurology, general surgery Procedures performed: open incisional hernia repair  Disposition: Home Diet recommendation:  Carb modified diet DISCHARGE MEDICATION: Allergies as of 12/30/2021       Reactions   Codeine Other (See Comments)   "Increases heart rate"   Morphine And Related Other (See Comments)  Hallucinations   Nsaids Other (See Comments)   Hx  GI bleed   Valium [diazepam] Other (See Comments)   Patient states that this medication made her want to commit sucide        Medication List     STOP taking these medications     citalopram 10 MG tablet Commonly known as: [place] PO   dicyclomine 20 MG tablet Commonly known as: BENTYL   diphenoxylate-atropine 2.5-0.025 MG tablet Commonly known as: LOMOTIL   gabapentin 100 MG capsule Commonly known as: NEURONTIN   Jardiance 25 MG Tabs tablet Generic drug: empagliflozin   lidocaine 4 % cream Commonly known as: LMX   lisinopril-hydrochlorothiazide 20-12.5 MG tablet Commonly known as: ZESTORETIC   loratadine 10 MG tablet Commonly known as: CLARITIN   lovastatin 20 MG tablet Commonly known as: MEVACOR   metFORMIN 500 MG tablet Commonly known as: GLUCOPHAGE   pioglitazone 15 MG tablet Commonly known as: ACTOS       TAKE these medications    aspirin 81 MG chewable tablet Chew 1 tablet (81 mg total) by mouth daily. Start taking on: December 31, 2021   atorvastatin 40 MG tablet Commonly known as: LIPITOR Take 1 tablet (40 mg total) by mouth daily. Start taking on: December 31, 2021   clonazePAM 0.5 MG tablet Commonly known as: KLONOPIN Take 0.5 mg by mouth 2 (two) times daily.   clopidogrel 75 MG tablet Commonly known as: PLAVIX Take 1 tablet (75 mg total) by mouth daily. Start taking on: December 31, 2021   diltiazem 180 MG 24 hr capsule Commonly known as: CARDIZEM CD Take 180 mg by mouth daily.   ergocalciferol 1.25 MG (50000 UT) capsule Commonly known as: VITAMIN D2 Take 50,000 Units by mouth once a week.   glipiZIDE 5 MG 24 hr tablet Commonly known as: GLUCOTROL XL Take 10 mg by mouth daily.   hydrALAZINE 50 MG tablet Commonly known as: APRESOLINE Take 1 tablet (50 mg total) by mouth every 8 (eight) hours.   levothyroxine 50 MCG tablet Commonly known as: SYNTHROID Take 50 mcg by mouth daily.   metoprolol succinate 50 MG 24 hr tablet Commonly known as: TOPROL-XL Take 1 tablet (50 mg total) by mouth daily. Take with or immediately following a meal. Start taking on: December 31, 2021 What  changed:  medication strength how much to take additional instructions   pantoprazole 40 MG tablet Commonly known as: PROTONIX TAKE 1 TABLET BY MOUTH EVERY DAY IN THE MORNING What changed: See the new instructions.   traMADol 50 MG tablet Commonly known as: ULTRAM Take 50 mg by mouth every 6 (six) hours as needed for moderate pain.        Follow-up Information     [facility] Follow up.   Why: [facility] staff will call you to scheulde in home physical therapy visits               Discharge Exam: Filed Weights   12/25/21 1329 12/25/21 1941 12/26/21 0400  Weight: 77.1 kg 77.4 kg 81.4 kg   HEENT:  [place]/AT, No thrush, no icterus CV:  RRR, no rub, no S3, no S4 Lung:  CTA, no wheeze, no rhonchi Abd:  soft/+BS, NT Ext:  No edema, no lymphangitis, no synovitis, no rash   Condition at discharge: stable  The results of significant diagnostics from this hospitalization (including imaging, microbiology, ancillary and laboratory) are listed below for reference.   Imaging  Studies: ECHOCARDIOGRAM COMPLETE  Result Date: 12/29/2021    ECHOCARDIOGRAM REPORT   Patient Name:   [name] Date of Exam: 12/29/2021 Medical Rec #:  [id number]        Height:       62.0 in Accession #:    [id number]       Weight:       179.5 lb Date of Birth:  02-22-1946         BSA:          1.826 m Patient Age:    83 years         BP:           145/65 mmHg Patient Gender: F                HR:           84 bpm. Exam Location:  [facility] Procedure: 2D Echo, Cardiac Doppler and Color Doppler Indications:    I63.9 Stroke  History:        Patient has no prior history of Echocardiogram examinations.                 Risk Factors:Hypertension and Diabetes.  Sonographer:    Alvino Chapel RCS Referring Phys: [place]  1. Left ventricular ejection fraction, by estimation, is 60 to 65%. The left ventricle has normal function. The left ventricle has no regional  wall motion abnormalities. There is moderate left ventricular hypertrophy. Left ventricular diastolic parameters are consistent with Grade I diastolic dysfunction (impaired relaxation).  2. Right ventricular systolic function is normal. The right ventricular size is normal. There is normal pulmonary artery systolic pressure.  3. Left atrial size was mildly dilated.  4. The mitral valve is normal in structure. No evidence of mitral valve regurgitation.  5. The aortic valve is tricuspid. Aortic valve regurgitation is not visualized. Aortic valve sclerosis/calcification is present, without any evidence of aortic stenosis.  6. The inferior vena cava is normal in size with greater than 50% respiratory variability, suggesting right atrial pressure of 3 mmHg. Comparison(s): No prior Echocardiogram. Conclusion(s)/Recommendation(s): Normal biventricular function without evidence of hemodynamically significant valvular heart disease. FINDINGS  Left Ventricle: Left ventricular ejection fraction, by estimation, is 60 to 65%. The left ventricle has normal function. The left ventricle has no regional wall motion abnormalities. The left ventricular internal cavity size was normal in size. There is  moderate left ventricular hypertrophy. Left ventricular diastolic parameters are consistent with Grade I diastolic dysfunction (impaired relaxation). Right Ventricle: The right ventricular size is normal. No increase in right ventricular wall thickness. Right ventricular systolic function is normal. There is normal pulmonary artery systolic pressure. The tricuspid regurgitant velocity is 2.81 m/s, and  with an assumed right atrial pressure of 3 mmHg, the estimated right ventricular systolic pressure is 96.2 mmHg. Left Atrium: Left atrial size was mildly dilated. Right Atrium: Right atrial size was normal in size. Pericardium: Trivial pericardial effusion is present. Mitral Valve: The mitral valve is normal in structure. Mild mitral  annular calcification. No evidence of mitral valve regurgitation. Tricuspid Valve: The tricuspid valve is normal in structure. Tricuspid valve regurgitation is trivial. Aortic Valve: The aortic valve is tricuspid. Aortic valve regurgitation is not visualized. Aortic valve sclerosis/calcification is present, without any evidence of aortic stenosis. Pulmonic Valve: The pulmonic valve was not well visualized. Pulmonic valve regurgitation is not visualized. Aorta: The aortic root is normal in size and structure. Venous: The inferior vena  cava is normal in size with greater than 50% respiratory variability, suggesting right atrial pressure of 3 mmHg. IAS/Shunts: No atrial level shunt detected by color flow Doppler.  LEFT VENTRICLE PLAX 2D LVIDd:         4.20 cm   Diastology LVIDs:         2.80 cm   LV e' medial:    4.90 cm/s LV PW:         1.60 cm   LV E/e' medial:  18.2 LV IVS:        1.30 cm   LV e' lateral:   6.74 cm/s LVOT diam:     1.80 cm   LV E/e' lateral: 13.2 LV SV:         63 LV SV Index:   34 LVOT Area:     2.54 cm  RIGHT VENTRICLE RV S prime:     9.36 cm/s TAPSE (M-mode): 2.5 cm LEFT ATRIUM             Index        RIGHT ATRIUM           Index LA diam:        3.80 cm 2.08 cm/m   RA Area:     14.80 cm LA Vol (A2C):   60.1 ml 32.92 ml/m  RA Volume:   35.30 ml  19.34 ml/m LA Vol (A4C):   60.0 ml 32.87 ml/m LA Biplane Vol: 61.1 ml 33.47 ml/m  AORTIC VALVE LVOT Vmax:   102.00 cm/s LVOT Vmean:  67.400 cm/s LVOT VTI:    0.246 m  AORTA Ao Root diam: 3.50 cm MITRAL VALVE                TRICUSPID VALVE MV Area (PHT): 3.21 cm     TR Peak grad:   31.6 mmHg MV Decel Time: 236 msec     TR Vmax:        281.00 cm/s MV E velocity: 89.30 cm/s MV A velocity: 147.00 cm/s  SHUNTS MV E/A ratio:  0.61         Systemic VTI:  0.25 m                             Systemic Diam: 1.80 cm Phineas Inches Electronically signed by Phineas Inches Signature Date/Time: 12/29/2021/3:08:31 PM    Final    US Carotid Bilateral  Result Date:  12/29/2021 CLINICAL DATA:  CVA.  Right MCA infarct. EXAM: BILATERAL CAROTID DUPLEX ULTRASOUND TECHNIQUE: Pearline Cables scale imaging, color Doppler and duplex ultrasound were performed of bilateral carotid and vertebral arteries in the neck. COMPARISON:  Brain MRI 12/28/2021 FINDINGS: Criteria: Quantification of carotid stenosis is based on velocity parameters that correlate the residual internal carotid diameter with NASCET-based stenosis levels, using the diameter of the distal internal carotid lumen as the denominator for stenosis measurement. The following velocity measurements were obtained: RIGHT ICA: 80/11 cm/sec CCA: 79/02 cm/sec SYSTOLIC ICA/CCA RATIO:  0.9 ECA: 287 cm/sec LEFT ICA: 125/25 cm/sec CCA: 40/97 cm/sec SYSTOLIC ICA/CCA RATIO:  1.9 ECA: 209 cm/sec RIGHT CAROTID ARTERY: Small amount of echogenic plaque at the carotid bulb. Right external carotid artery is patent with slightly elevated peak systolic velocity. Small amount of echogenic plaque in the proximal internal carotid artery. Normal waveforms and velocities in the internal carotid artery. RIGHT VERTEBRAL ARTERY: Antegrade flow and normal waveform in the right vertebral artery. LEFT CAROTID ARTERY: Small amount of  plaque in the distal common carotid artery and bulb. External carotid artery is patent with normal waveform. Echogenic plaque in the proximal internal carotid artery. Normal waveforms and velocities in the internal carotid artery. LEFT VERTEBRAL ARTERY:  Not visualized and could be occluded. IMPRESSION: 1. Atherosclerotic plaque involving bilateral carotid arteries. Estimated degree of stenosis in the internal carotid arteries is less than 50% bilaterally. 2. Cannot exclude stenosis involving the right external carotid artery. 3. Left vertebral artery is not visualized. 4. Right vertebral artery is patent with antegrade flow. Electronically Signed   By: Markus Daft M.D.   On: 12/29/2021 13:07   MR BRAIN W WO CONTRAST  Result Date:  12/28/2021 CLINICAL DATA:  Headache, new or worsening, concern for neoplasm on CT EXAM: MRI HEAD WITHOUT AND WITH CONTRAST MRA HEAD WITHOUT CONTRAST TECHNIQUE: Multiplanar, multi-echo pulse sequences of the brain and surrounding structures were acquired without and with intravenous contrast. Angiographic images of the Circle of Willis were acquired using MRA technique without intravenous contrast. CONTRAST:  62m GADAVIST GADOBUTROL 1 MMOL/ML IV SOLN COMPARISON:  No prior MRI, correlation is made with CT head 12/28/2021 FINDINGS: MRI HEAD FINDINGS Brain: Large area of restricted diffusion with ADC correlate in the right temporal lobe, anterior occipital lobe, and anteroinferior parietal lobe (series 5, images 12-22), concerning for acute and/or subacute infarct. This area is associated with intrinsically T1 hyperintense and T2 hypointense material (series 18, image 28 and series 15, image 9), likely early subacute petechial hemorrhage, with some additional T1 isointense and T2 hyperintense material, likely more acute petechial hemorrhage, as well as contrast enhancement in this area (series 19, image 22), which suggests that the infarct is subacute. No definite enhancing mass in this area. Increased T2 signal throughout this area, with gyral swelling. No acute hemorrhage, mass, mass effect, or midline shift. No hydrocephalus or additional extra-axial collection. Vascular: Normal arterial flow voids. Normal arterial and venous enhancement. Skull and upper cervical spine: Normal marrow signal. Sinuses/Orbits: Mucous retention cyst in the left sphenoid sinus. Status post bilateral lens replacements. Other: The mastoids are well aerated. MRA HEAD FINDINGS Evaluation is somewhat limited by motion artifact. Anterior circulation: Both internal carotid arteries are patent to the termini, with mild narrowing in the right distal petrous segment and right cavernous segment. Patent left A1. Stenosed or aplastic right A1. Anterior  cerebral arteries demonstrate multifocal irregularity but are patent to their distal aspects. Mild-to-moderate stenosis in the right M1 segment (series 10, images 95, 99, 101). Poor perfusion in several M2 and M3 branches (series 10, image 114 138, 145). No stenosis or occlusion in the left M1 segment. Mild irregularity in the right M2 branches (series 10, image 96, 112). Distal MCA branches appear perfused. Posterior circulation: Distal vertebral arteries patent to the vertebrobasilar junction without stenosis. Basilar patent to its distal aspect. Superior cerebellar arteries patent bilaterally. Patent left P1. Fetal origin of the right PCA from a prominent right posterior communicating artery. Poor signal in the proximal right P2 (series 10, image 82), which may be artifactual. PCAs otherwise perfused to their distal aspects without stenosis. The left posterior communicating artery is not visualized. Anatomic variants: Fetal origin of the right PCA. IMPRESSION: 1. 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 2. Evaluation of the intracranial vasculature on MRA is limited motion artifact. Within this limitation, there  is mild-to-moderate stenosis in the right M1 segment and multifocal poor perfusion in several right M2 and M3 branches. Additional mild irregularity in the right M2 branches and distal ACAs. Additional poor signal in the proximal right P2 may be artifactual. If there is a clinical indication for more definitive evaluation of the intracranial vasculature, a CTA of the head is recommended. Impression #1 will be called to the ordering clinician or representative by the Radiologist Assistant, and communication documented in the PACS or [employer]. Electronically Signed   By:  Merilyn Baba M.D.   On: 12/28/2021 19:29   MR ANGIO HEAD WO CONTRAST  Result Date: 12/28/2021 CLINICAL DATA:  Headache, new or worsening, concern for neoplasm on CT EXAM: MRI HEAD WITHOUT AND WITH CONTRAST MRA HEAD WITHOUT CONTRAST TECHNIQUE: Multiplanar, multi-echo pulse sequences of the brain and surrounding structures were acquired without and with intravenous contrast. Angiographic images of the Circle of Willis were acquired using MRA technique without intravenous contrast. CONTRAST:  62m GADAVIST GADOBUTROL 1 MMOL/ML IV SOLN COMPARISON:  No prior MRI, correlation is made with CT head 12/28/2021 FINDINGS: MRI HEAD FINDINGS Brain: Large area of restricted diffusion with ADC correlate in the right temporal lobe, anterior occipital lobe, and anteroinferior parietal lobe (series 5, images 12-22), concerning for acute and/or subacute infarct. This area is associated with intrinsically T1 hyperintense and T2 hypointense material (series 18, image 28 and series 15, image 9), likely early subacute petechial hemorrhage, with some additional T1 isointense and T2 hyperintense material, likely more acute petechial hemorrhage, as well as contrast enhancement in this area (series 19, image 22), which suggests that the infarct is subacute. No definite enhancing mass in this area. Increased T2 signal throughout this area, with gyral swelling. No acute hemorrhage, mass, mass effect, or midline shift. No hydrocephalus or additional extra-axial collection. Vascular: Normal arterial flow voids. Normal arterial and venous enhancement. Skull and upper cervical spine: Normal marrow signal. Sinuses/Orbits: Mucous retention cyst in the left sphenoid sinus. Status post bilateral lens replacements. Other: The mastoids are well aerated. MRA HEAD FINDINGS Evaluation is somewhat limited by motion artifact. Anterior circulation: Both internal carotid arteries are patent to the termini, with mild narrowing in the right distal petrous  segment and right cavernous segment. Patent left A1. Stenosed or aplastic right A1. Anterior cerebral arteries demonstrate multifocal irregularity but are patent to their distal aspects. Mild-to-moderate stenosis in the right M1 segment (series 10, images 95, 99, 101). Poor perfusion in several M2 and M3 branches (series 10, image 114 138, 145). No stenosis or occlusion in the left M1 segment. Mild irregularity in the right M2 branches (series 10, image 96, 112). Distal MCA branches appear perfused. Posterior circulation: Distal vertebral arteries patent to the vertebrobasilar junction without stenosis. Basilar patent to its distal aspect. Superior cerebellar arteries patent bilaterally. Patent left P1. Fetal origin of the right PCA from a prominent right posterior communicating artery. Poor signal in the proximal right P2 (series 10, image 82), which may be artifactual. PCAs otherwise perfused to their distal aspects without stenosis. The left posterior communicating artery is not visualized. Anatomic variants: Fetal origin of the right PCA. IMPRESSION: 1. 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 2. Evaluation of the intracranial vasculature on MRA is limited motion artifact. Within this limitation, there is mild-to-moderate stenosis in the right  M1 segment and multifocal poor perfusion in several right M2 and M3 branches. Additional mild irregularity in the right M2 branches and distal ACAs. Additional poor signal in the proximal right P2 may be artifactual. If there is a clinical indication for more definitive evaluation of the intracranial vasculature, a CTA of the head is recommended. Impression #1 will be called to the ordering clinician or representative by the Radiologist Assistant,  and communication documented in the PACS or [employer]. Electronically Signed   By: Merilyn Baba M.D.   On: 12/28/2021 19:29   CT HEAD WO CONTRAST (5MM)  Result Date: 12/28/2021 CLINICAL DATA:  Provided history: Headache, new or worsening. Additional history provided: New onset headache top of head. Patient feels as though she has a fever. EXAM: CT HEAD WITHOUT CONTRAST TECHNIQUE: Contiguous axial images were obtained from the base of the skull through the vertex without intravenous contrast. RADIATION DOSE REDUCTION: This exam was performed according to the departmental dose-optimization program which includes automated exposure control, adjustment of the mA and/or kV according to patient size and/or use of iterative reconstruction technique. COMPARISON:  No pertinent prior exams available for comparison. FINDINGS: Brain: Mild generalized parenchymal atrophy. Moderate-sized region of cortical/subcortical edema within the right temporal and parietal lobes. Lacunar infarct within the left basal ganglia. Background advanced patchy and confluent hypoattenuation within the cerebral white matter, nonspecific but compatible with chronic small vessel ischemic disease. There is no acute intracranial hemorrhage. No extra-axial fluid collection. No midline shift. Vascular: No hyperdense vessel. Atherosclerotic calcifications. Skull: No fracture or aggressive osseous lesion. Sinuses/Orbits: No mass or acute finding within the imaged orbits. 11 mm mucous retention cyst within the left sphenoid sinus. These results will be called to the ordering clinician or representative by the Radiologist Assistant, and communication documented in the PACS or [employer]. IMPRESSION: Moderate-sized region of cortical/subcortical edema within the right temporal and parietal lobes, indeterminate. Primary differential considerations include an acute/early subacute infarct, cerebritis or edema related to an underlying mass. A brain  MRI without and with contrast is recommended for further evaluation. 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 Background advanced chronic small vessel image changes within the cerebral white matter. Mild generalized parenchymal atrophy. 11 mm left sphenoid sinus mucous retention cyst. Electronically Signed   By: Kellie Simmering D.O.   On: 12/28/2021 11:05   DG Abd 1 View  Result Date: 12/27/2021 CLINICAL DATA:  Nausea with right lower abdominal pain. Recent hernia repair. EXAM: ABDOMEN - 1 VIEW COMPARISON:  Abdominopelvic CT 12/25/2021. FINDINGS: 1036 hours. Two views are submitted. Portions of the left abdomen are excluded from this portable study. The visualized bowel gas pattern is nonobstructive. No supine evidence of free intraperitoneal air. Cholecystectomy clips and diffuse aortic atherosclerosis are noted. IMPRESSION: No evidence of acute abdominal process on supine radiography. Electronically Signed   By: Richardean Sale M.D.   On: 12/27/2021 12:09   CT Abdomen Pelvis W Contrast  Result Date: 12/25/2021 CLINICAL DATA:  Generalized abdominal pain for 2 weeks, urinary retention, nausea EXAM: CT ABDOMEN AND PELVIS WITH CONTRAST TECHNIQUE: Multidetector CT imaging of the abdomen and pelvis was performed using the standard protocol following bolus administration of intravenous contrast. RADIATION DOSE REDUCTION: This exam was performed according to the departmental dose-optimization program which includes automated exposure control, adjustment of the mA and/or kV according to patient size and/or use of iterative reconstruction technique. CONTRAST:  96m OMNIPAQUE IOHEXOL 300 MG/ML  SOLN COMPARISON:  12/18/2021 FINDINGS: Lower chest: No acute  pleural or parenchymal lung disease. Hepatobiliary: No focal liver abnormality is seen. Status post cholecystectomy. No biliary dilatation. Pancreas: Unremarkable. No pancreatic ductal dilatation or surrounding inflammatory changes. Spleen: Normal in size  without focal abnormality. Adrenals/Urinary Tract: Numerous stable hypodensities throughout the bilateral kidneys consistent with simple and hemorrhagic cysts. No specific imaging follow-up is required. No urinary tract calculi or obstructive uropathy within either kidney. The adrenals and bladder are unremarkable. Stomach/Bowel: No bowel obstruction or ileus. Stable colonic diverticulosis without evidence of acute diverticulitis. No bowel wall thickening or inflammatory change. Evidence of prior partial sigmoid colon resection and reanastomosis. Vascular/Lymphatic: Aortic atherosclerosis. No enlarged abdominal or pelvic lymph nodes. Reproductive: Status post hysterectomy. No adnexal masses. Other: No free fluid or free intraperitoneal gas. A small fat containing supraumbilical midline ventral hernia is identified, without associated bowel herniation. There is a second abdominal wall hernia within the right lower quadrant, reference image 61/2. At this location, there is a 1.2 by 2.0 cm abdominal wall defect, which contains herniated mesenteric fat and a small portion of the anti mesenteric wall of the cecum, reference image 62/2. No evidence of incarceration or bowel obstruction. Musculoskeletal: No acute or destructive bony lesions. Reconstructed images demonstrate no additional findings. IMPRESSION: 1. Right lower quadrant ventral hernia, containing herniated mesenteric fat and a small portion of the anti mesenteric wall the cecum. Please correlate with site of patient discomfort. No evidence of incarceration or bowel obstruction at this time. 2. Small fat containing midline supraumbilical ventral hernia. 3. Diverticulosis without evidence of acute diverticulitis. 4.  Aortic Atherosclerosis (ICD10-I70.0). Electronically Signed   By: Randa Ngo M.D.   On: 12/25/2021 15:27    Microbiology: Results for orders placed or performed during the hospital encounter of 12/25/21  MRSA Next Gen by PCR, Nasal      Status: None   Collection Time: 12/25/21  7:43 PM   Specimen: Nasal Mucosa; Nasal Swab  Result Value Ref Range Status   MRSA by PCR Next Gen NOT DETECTED NOT DETECTED Final    Comment: (NOTE) The GeneXpert MRSA Assay (FDA approved for NASAL specimens only), is one component of a comprehensive MRSA colonization surveillance program. It is not intended to diagnose MRSA infection nor to guide or monitor treatment for MRSA infections. Test performance is not FDA approved in patients less than 34 years old. Performed at [facility], [address], [place], [place] [address]     Labs: CBC: Recent Labs  Lab 12/25/21 1347 12/26/21 0641 12/27/21 0412 12/28/21 0357 12/29/21 0353  WBC 11.8* 17.0* 13.2* 16.5* 10.0  NEUTROABS  --   --  11.0*  --   --   HGB 17.1* 16.0* 15.6* 16.1* 14.5  HCT 53.7* 50.9* 50.5* 51.3* 46.7*  MCV 85.0 85.3 87.1 85.4 85.5  PLT 428* 442* 376 434* 604   Basic Metabolic Panel: Recent Labs  Lab 12/25/21 1347 12/26/21 0641 12/26/21 0643 12/27/21 0412 12/28/21 0756 12/29/21 0353  NA 138 141  --  138 137 139  K 3.8 3.3*  --  4.5 4.4 3.8  CL 105 111  --  109 107 108  CO2 24 22  --  22 20* 23  GLUCOSE 310* 163*  --  123* 141* 135*  BUN 33* 26*  --  23 24* 26*  CREATININE 1.62* 1.42*  --  1.52* 1.55* 1.58*  CALCIUM 8.6* 8.2*  --  8.4* 8.9 8.8*  MG  --   --  1.8 2.4  --  2.3   Liver  Function Tests: Recent Labs  Lab 12/25/21 1347 12/26/21 0641  AST 19 15  ALT 12 10  ALKPHOS 77 63  BILITOT 0.5 0.7  PROT 7.7 6.4*  ALBUMIN 4.1 3.4*   CBG: Recent Labs  Lab 12/29/21 1131 12/29/21 1635 12/29/21 2154 12/30/21 0759 12/30/21 1111  GLUCAP 232* 170* 169* 193* 237*    Discharge time spent: greater than 30 minutes.  Signed: Orson Eva, MD [facility] 12/30/2021

## 2022-01-07 ENCOUNTER — Telehealth: Payer: Self-pay | Admitting: Neurology

## 2022-01-07 NOTE — Telephone Encounter (Signed)
Patient has been referred back to [place] to follow up on recent stroke. Since it's been 3 years since patient was last seen, Dr. Rexene Alberts is recommending patient be seen by one of the stroke providers. Patient's daughter called in to check on the status of referral and mentioned they would like for her to see you Dr. Leta Baptist. Would you be ok with this?

## 2022-02-03 ENCOUNTER — Encounter: Payer: Self-pay | Admitting: "Endocrinology

## 2022-02-03 ENCOUNTER — Ambulatory Visit (INDEPENDENT_AMBULATORY_CARE_PROVIDER_SITE_OTHER): Payer: PPO | Admitting: "Endocrinology

## 2022-02-03 VITALS — BP 142/66 | HR 60 | Ht 62.0 in | Wt 161.4 lb

## 2022-02-03 DIAGNOSIS — E039 Hypothyroidism, unspecified: Secondary | ICD-10-CM | POA: Diagnosis not present

## 2022-02-03 DIAGNOSIS — I10 Essential (primary) hypertension: Secondary | ICD-10-CM

## 2022-02-03 DIAGNOSIS — E782 Mixed hyperlipidemia: Secondary | ICD-10-CM

## 2022-02-03 DIAGNOSIS — N1832 Chronic kidney disease, stage 3b: Secondary | ICD-10-CM | POA: Diagnosis not present

## 2022-02-03 DIAGNOSIS — E1122 Type 2 diabetes mellitus with diabetic chronic kidney disease: Secondary | ICD-10-CM | POA: Diagnosis not present

## 2022-02-03 NOTE — Patient Instructions (Signed)

## 2022-02-03 NOTE — Progress Notes (Signed)
Endocrinology Consult Note       02/03/2022, 7:51 PM   Subjective:    Patient ID: [name], female    DOB: 07/25/1945.  [name] is being seen in consultation for management of currently uncontrolled symptomatic diabetes requested by  Glenda Chroman, MD.   Past Medical History:  Diagnosis Date   Anxiety    Benign essential tremor    Cardiac murmur    Reportedly for years-mild   Chronic diarrhea    Degenerative joint disease    Diabetes mellitus    Diabetic neuropathy ([place])    Diverticulosis    Essential hypertension, benign    GERD (gastroesophageal reflux disease)    GI bleed    NSAID use   PONV (postoperative nausea and vomiting)    Seasonal allergies    Shortness of breath    TIA (transient ischemic attack)    1998   Type 2 diabetes mellitus ([place])    Urinary incontinence     Past Surgical History:  Procedure Laterality Date   ABDOMINAL HYSTERECTOMY     APPENDECTOMY     Arthroscopic left shoulder surgery     BREAST SURGERY     lumpectomy X 2-benign   CARPAL TUNNEL RELEASE Left 10/03/2012   Procedure: CARPAL TUNNEL RELEASE;  Surgeon: Cammie Sickle., MD;  Location: [facility];  Service: Orthopedics;  Laterality: Left;   CHOLECYSTECTOMY     COLON SURGERY     COLONOSCOPY  03/03/09   COLONOSCOPY  09/16/2004   ROURK   Colostomy with reversal     ESOPHAGOGASTRODUODENOSCOPY     EGD 06/08/2008   ESOPHAGOGASTRODUODENOSCOPY  09/16/04   DIAG EGD W/TCS   INCISIONAL HERNIA REPAIR N/A 12/25/2021   Procedure: HERNIA REPAIR INCISIONAL;  Surgeon: Benjamine Sprague, DO;  Location: [facility];  Service: General;  Laterality: N/A;   REPLACEMENT TOTAL KNEE BILATERAL  2001, 2005   Right elbow surgery     TENDON RECONSTRUCTION Left 10/03/2012   Procedure: LEFT MEDIAL ELBOW RECONSTRUCTION;  Surgeon: Cammie Sickle., MD;  Location: [facility];  Service:  Orthopedics;  Laterality: Left;   TONSILLECTOMY      Social History   Socioeconomic History   Marital status: Married    Spouse name: Not on file   Number of children: Not on file   Years of education: Not on file   Highest education level: Not on file  Occupational History   Occupation: Disabled  Tobacco Use   Smoking status: Former    Packs/day: 2.00    Years: 40.00    Total pack years: 80.00    Types: Cigarettes    Quit date: 11/02/1997    Years since quitting: 24.2   Smokeless tobacco: Never   Tobacco comments:    quit June [id number]  Vaping Use   Vaping Use: Never used  Substance and Sexual Activity   Alcohol use: No    Alcohol/week: 0.0 standard drinks of alcohol   Drug use: No   Sexual activity: Not on file  Other Topics Concern   Not on file  Social History  Narrative   Not on file   Social Determinants of Health   Financial Resource Strain: Not on file  Food Insecurity: Not on file  Transportation Needs: Not on file  Physical Activity: Not on file  Stress: Not on file  Social Connections: Not on file    Family History  Problem Relation Age of Onset   Cancer Father        Leukemia   Colon cancer Neg Hx     Outpatient Encounter Medications as of 02/03/2022  Medication Sig   diltiazem (CARDIZEM CD) 180 MG 24 hr capsule Take 180 mg by mouth daily.   empagliflozin (JARDIANCE) 25 MG TABS tablet Take by mouth daily.   gabapentin (NEURONTIN) 100 MG capsule Take 100 mg by mouth 2 (two) times daily.   lisinopril (ZESTRIL) 10 MG tablet Take 10 mg by mouth daily.   METOPROLOL TARTRATE PO Take 25 mg by mouth daily.   aspirin 81 MG chewable tablet Chew 1 tablet (81 mg total) by mouth daily.   atorvastatin (LIPITOR) 40 MG tablet Take 1 tablet (40 mg total) by mouth daily.   clonazePAM (KLONOPIN) 0.5 MG tablet Take 0.5 mg by mouth 4 (four) times daily as needed.   clopidogrel (PLAVIX) 75 MG tablet Take 1 tablet (75 mg total) by mouth daily.   ergocalciferol  (VITAMIN D2) 50000 UNITS capsule Take 50,000 Units by mouth once a week.   glipiZIDE (GLUCOTROL XL) 5 MG 24 hr tablet Take 10 mg by mouth daily.    hydrALAZINE (APRESOLINE) 50 MG tablet Take 1 tablet (50 mg total) by mouth every 8 (eight) hours.   levothyroxine (SYNTHROID, LEVOTHROID) 50 MCG tablet Take 50 mcg by mouth daily.   metoprolol succinate (TOPROL-XL) 50 MG 24 hr tablet Take 1 tablet (50 mg total) by mouth daily. Take with or immediately following a meal. (Patient not taking: Reported on 02/03/2022)   pantoprazole (PROTONIX) 40 MG tablet TAKE 1 TABLET BY MOUTH EVERY DAY IN THE MORNING (Patient taking differently: Take 40 mg by mouth daily.)   traMADol (ULTRAM) 50 MG tablet Take 50 mg by mouth every 6 (six) hours as needed for moderate pain.   No facility-administered encounter medications on file as of 02/03/2022.    ALLERGIES: Allergies  Allergen Reactions   Codeine Other (See Comments)    "Increases heart rate"    Morphine And Related Other (See Comments)    Hallucinations    Nsaids Other (See Comments)    Hx  GI bleed   Valium [Diazepam] Other (See Comments)    Patient states that this medication made [name] want to commit sucide    VACCINATION STATUS:  There is no immunization history on file for this patient.  Diabetes [name] presents for [name] initial diabetic visit. [name] has type 2 diabetes mellitus. Onset time: [name] was diagnosed at approximate age of 71 years. There are no hypoglycemic associated symptoms. Pertinent negatives for hypoglycemia include no confusion, headaches, pallor or seizures. Associated symptoms include fatigue, polydipsia and polyuria. Pertinent negatives for diabetes include no chest pain and no polyphagia. There are no hypoglycemic complications. Diabetic complications include peripheral neuropathy. Risk factors for coronary artery disease include dyslipidemia, diabetes mellitus, family history, hypertension, post-menopausal, sedentary lifestyle and tobacco  exposure. Current diabetic treatment includes oral agent (dual therapy). [name] is following a generally unhealthy diet. When asked about meal planning, [name] reported none. [name] home blood glucose trend is fluctuating minimally. [name] breakfast blood glucose range is generally 140-180 mg/dl. [name] lunch blood  glucose range is generally 180-200 mg/dl. [name] dinner blood glucose range is generally 180-200 mg/dl. [name] bedtime blood glucose range is generally 180-200 mg/dl. [name] overall blood glucose range is 180-200 mg/dl. ([name] wears a CGM device showing average blood glucose of 181 for the last 14 days.  [name] AGP report shows 54% time range, 37% time with level 1 hyperglycemia, 9% with level 2 hyperglycemia.  [name] does not have hypoglycemia.  [name] recent A1c was 8%.    ) An ACE inhibitor/angiotensin II receptor blocker is being taken.  Hyperlipidemia This is a chronic problem. The current episode started more than 1 year ago. The problem is uncontrolled. Recent lipid tests were reviewed and are variable. Pertinent negatives include no chest pain, myalgias or shortness of breath. Current antihyperlipidemic treatment includes statins. Risk factors for coronary artery disease include dyslipidemia, diabetes mellitus, hypertension, a sedentary lifestyle, post-menopausal and family history.  Hypertension This is a chronic problem. Pertinent negatives include no chest pain, headaches, palpitations or shortness of breath. Risk factors for coronary artery disease include dyslipidemia, diabetes mellitus, smoking/tobacco exposure and sedentary lifestyle. Past treatments include ACE inhibitors.     Review of Systems  Constitutional:  Positive for fatigue. Negative for chills, fever and unexpected weight change.  HENT:  Negative for trouble swallowing and voice change.   Eyes:  Negative for visual disturbance.  Respiratory:  Negative for cough, shortness of breath and wheezing.   Cardiovascular:  Negative for chest pain,  palpitations and leg swelling.  Gastrointestinal:  Negative for diarrhea, nausea and vomiting.  Endocrine: Positive for polydipsia and polyuria. Negative for cold intolerance, heat intolerance and polyphagia.  Musculoskeletal:  Negative for arthralgias and myalgias.  Skin:  Negative for color change, pallor, rash and wound.  Neurological:  Negative for seizures and headaches.  Psychiatric/Behavioral:  Negative for confusion and suicidal ideas.     Objective:       02/03/2022    9:22 AM 12/30/2021    8:53 AM 12/30/2021    8:52 AM  Vitals with BMI  Height '5\' 2"'$     Weight 161 lbs 6 oz    BMI 87.56    Systolic 433  295  Diastolic 66  61  Pulse 60 102     BP (!) 142/66   Pulse 60   Ht '5\' 2"'$  (1.575 m)   Wt 161 lb 6.4 oz (73.2 kg)   BMI 29.52 kg/m   Wt Readings from Last 3 Encounters:  02/03/22 161 lb 6.4 oz (73.2 kg)  12/26/21 179 lb 7.3 oz (81.4 kg)  11/23/18 159 lb 11.2 oz (72.4 kg)     Physical Exam Constitutional:      Appearance: [name] is well-developed.  HENT:     Head: Normocephalic and atraumatic.  Neck:     Thyroid: No thyromegaly.     Trachea: No tracheal deviation.  Cardiovascular:     Rate and Rhythm: Normal rate and regular rhythm.  Pulmonary:     Effort: Pulmonary effort is normal.     Breath sounds: Normal breath sounds.  Abdominal:     General: Bowel sounds are normal.     Palpations: Abdomen is soft.     Tenderness: There is no abdominal tenderness. There is no guarding.  Musculoskeletal:        General: Normal range of motion.     Cervical back: Normal range of motion and neck supple.     Comments: [name] ambulates with a cane due to tremor and disequilibrium.  Skin:  General: Skin is warm and dry.     Coloration: Skin is not pale.     Findings: No erythema or rash.  Neurological:     Mental Status: [name] is alert and oriented to person, place, and time.     Cranial Nerves: No cranial nerve deficit.     Coordination: Coordination normal.     Deep  Tendon Reflexes: Reflexes are normal and symmetric.  Psychiatric:        Judgment: Judgment normal.       CMP ( most recent) CMP     Component Value Date/Time   NA 139 12/29/2021 0353   K 3.8 12/29/2021 0353   CL 108 12/29/2021 0353   CO2 23 12/29/2021 0353   GLUCOSE 135 (H) 12/29/2021 0353   BUN 26 (H) 12/29/2021 0353   CREATININE 1.58 (H) 12/29/2021 0353   CREATININE 1.10 10/16/2013 1554   CALCIUM 8.8 (L) 12/29/2021 0353   PROT 6.4 (L) 12/26/2021 0641   ALBUMIN 3.4 (L) 12/26/2021 0641   AST 15 12/26/2021 0641   ALT 10 12/26/2021 0641   ALKPHOS 63 12/26/2021 0641   BILITOT 0.7 12/26/2021 0641   GFRNONAA 33 (L) 12/29/2021 0353   GFRAA 71 (L) 10/02/2012 1420     Diabetic Labs (most recent): Lab Results  Component Value Date   HGBA1C 8.0 (H) 12/25/2021     Lipid Panel ( most recent) Lipid Panel     Component Value Date/Time   CHOL 156 12/29/2021 0743   TRIG 199 (H) 12/29/2021 0743   HDL 33 (L) 12/29/2021 0743   CHOLHDL 4.7 12/29/2021 0743   VLDL 40 12/29/2021 0743   LDLCALC 83 12/29/2021 0743      Assessment & Plan:   1. Type 2 diabetes mellitus with stage 3b chronic kidney disease, without long-term current use of insulin ([place])    - [name] has currently uncontrolled symptomatic type 2 DM since  76 years of age,  with most recent A1c of 8 %. Recent labs reviewed. - I had a long discussion with [name] about the possible risk factors and  the pathology behind its diabetes and its complications. -[name] diabetes is complicated by sedentary life, history of smoking and [name] remains at a high risk for more acute and chronic complications which include CAD, CVA, CKD, retinopathy, and neuropathy. These are all discussed in detail with [name].  - I discussed all available options of managing [name] diabetes including de-escalation of medications. I have counseled [name] on Food as Medicine by adopting a Whole Food , Plant Predominant  ( WFPP) nutrition as recommended by  [employer] of Lifestyle Medicine. Patient is encouraged to switch to  unprocessed or minimally processed  complex starch, adequate protein intake (mainly plant source), minimal liquid fat, plenty of fruits, and vegetables. -  [name] is advised to stick to a routine mealtimes to eat 3 complete meals a day and snack only when necessary ( to snack only to correct hypoglycemia BG <70 day time or <100 at night).   - [name] acknowledges that there is a room for improvement in [name] food and drink choices. - Further Specific Suggestion is made for [name] to avoid simple carbohydrates  from [name] diet including Cakes, Sweet Desserts, Ice Cream, Soda (diet and regular), Sweet Tea, Candies, Chips, Cookies, Store Bought Juices, Alcohol ,  Artificial Sweeteners,  Coffee Creamer, and "Sugar-free" Products. This will help patient to have more stable blood glucose profile and potentially avoid unintended weight gain.  The following Lifestyle Medicine recommendations according to [place] [facility]) were discussed and offered to patient and [name] agrees to start the journey:  A.  Whole Foods, Plant-based plate comprising of fruits and vegetables, plant-based proteins, whole-grain carbohydrates was discussed in detail with the patient.   A list for source of those nutrients were also provided to the patient.  Patient will use only water or unsweetened tea for hydration. B.  The need to stay away from risky substances including alcohol, smoking; obtaining 7 to 9 hours of restorative sleep, at least 150 minutes of moderate intensity exercise weekly, the importance of healthy social connections,  and stress reduction techniques were discussed. C.  A full color page of  Calorie density of various food groups per pound showing examples of each food groups was provided to the patient.  - [name] will be scheduled with Jearld Fenton, RDN, CDE for individualized diabetes education.  - I have approached [name] with  the following individualized plan to manage  [name] diabetes and patient agrees:   -Patient is hesitant to engage in lifestyle medicine.  However, [name] would not be considered for insulin treatment at this point.  [name] will not execute insulin orders without the help of [name] family members.  [name] daughter is offering to help.  [name] will be reassessed in 9 weeks with other measurements of A1c.   In the meantime, [name] is advised to continue Jardiance 25 mg p.o. daily, glipizide 10 mg p.o. once a day.   Advised to continue to use [name] CGM continuously.  - [name] is encouraged to call clinic for blood glucose levels less than 70 or above 300 mg /dl. [name] returns with significant hyperglycemia, [name] will be considered for basal insulin.   - Specific targets for  A1c;  LDL, HDL,  and Triglycerides were discussed with the patient.  2) Blood Pressure /Hypertension:  [name] blood pressure is  controlled to target.   [name] is advised to continue [name] current medications including hydralazine 50 mg p.o. 3 times daily, lisinopril 10 mg p.o. daily, metoprolol 25 mg p.o. daily.    3) Lipids/Hyperlipidemia:   Review of [name] recent lipid panel showed  controlled  LDL at 88 .  [name]  is advised to continue    atorvastatin 40 mg daily at bedtime.  Side effects and precautions discussed with [name].  4)  Weight/Diet:  Body mass index is 29.52 kg/m.  -   clearly complicating [name] diabetes care.   [name] is  a candidate for weight loss. I discussed with [name] the fact that loss of 5 - 10% of [name]  current body weight will have the most impact on [name] diabetes management.  The above detailed  ACLM recommendations for nutrition, exercise, sleep, social life, avoidance of risky substances, the need for restorative sleep   information will also detailed on discharge instructions.  5) hypothyroidism: The circumstance of [name] diagnosis is not available to review. [name] is advised to continue levothyroxine 50 mcg p.o. daily before breakfast.  - We discussed  about the correct intake of [name] thyroid hormone, on empty stomach at fasting, with water, separated by at least 30 minutes from breakfast and other medications,  and separated by more than 4 hours from calcium, iron, multivitamins, acid reflux medications (PPIs). -Patient is made aware of the fact that thyroid hormone replacement is needed for life, dose to be adjusted by periodic monitoring of thyroid function tests.   6) Chronic Care/Health Maintenance:  -  [name]  is on ACEI/ARB and Statin medications and  is encouraged to initiate and continue to follow up with Ophthalmology, Dentist,  Podiatrist at least yearly or according to recommendations, and advised to   stay away from smoking. I have recommended yearly flu vaccine and pneumonia vaccine at least every 5 years; moderate intensity exercise for up to 150 minutes weekly; and  sleep for 7- 9 hours a day.  - [name] is  advised to maintain close follow up with Glenda Chroman, MD for primary care needs, as well as [name] other providers for optimal and coordinated care.   I spent 65 minutes in the care of the patient today including review of labs from [facility], Lipids, Thyroid Function, Hematology (current and previous including abstractions from other facilities); face-to-face time discussing  [name] blood glucose readings/logs, discussing hypoglycemia and hyperglycemia episodes and symptoms, medications doses, [name] options of short and long term treatment based on the latest standards of care / guidelines;  discussion about incorporating lifestyle medicine;  and documenting the encounter. Risk reduction counseling performed per USPSTF guidelines to reduce obesity and cardiovascular risk factors.      Please refer to Patient Instructions for Blood Glucose Monitoring and Insulin/Medications Dosing Guide"  in media tab for additional information. Please  also refer to " Patient Self Inventory" in the Media  tab for reviewed elements of pertinent patient  history.  [name] participated in the discussions, expressed understanding, and voiced agreement with the above plans.  All questions were answered to [name] satisfaction. [name] is encouraged to contact clinic should [name] have any questions or concerns prior to [name] return visit.   Follow up plan: - Return in about 9 weeks (around 04/07/2022) for Bring Meter/CGM Device/Logs- A1c in Office.  Glade Lloyd, MD [facility] [address] [place], [place] [address] Phone: [phone]  Fax: [phone]    02/03/2022, 7:51 PM  This note was partially dictated with voice recognition software. Similar sounding words can be transcribed inadequately or may not  be corrected upon review.

## 2022-02-05 ENCOUNTER — Ambulatory Visit: Payer: PPO | Admitting: Diagnostic Neuroimaging

## 2022-02-05 ENCOUNTER — Encounter: Payer: Self-pay | Admitting: Diagnostic Neuroimaging

## 2022-02-05 VITALS — BP 172/90 | HR 62 | Ht 62.0 in | Wt 161.0 lb

## 2022-02-05 DIAGNOSIS — I6930 Unspecified sequelae of cerebral infarction: Secondary | ICD-10-CM

## 2022-02-05 NOTE — Progress Notes (Signed)
[facility]  PATIENT: [name] DOB: 05-Dec-1945  REFERRING CLINICIAN: Murlean Iba, MD HISTORY FROM: patient and daughter REASON FOR VISIT: new consult   HISTORICAL  CHIEF COMPLAINT:  Chief Complaint  Patient presents with   Rm 6    Patient is here with her daughter. She is here for a stroke that occurred on December 25, 2021. 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    HISTORY OF PRESENT ILLNESS:   76 year old female here for evaluation of stroke follow-up.  Patient was in the hospital in August 2023 for hernia surgery.  Upon postoperative evaluation she had severe headaches and hypertension.  CT of the head demonstrated abnormality, followed up with MRI which confirmed right temporoparietal ischemic infarction.  Stroke work-up was completed.  She was discharged on aspirin and Plavix, statin and blood pressure and diabetes control.  Since then patient is back home.  She continues to have some memory lapse, emotional lability.  Some of these were present before the stroke, but have worsened since the stroke.  Also has left superior quadrantanopsia, confirmed by ophthalmology, related to right temporal stroke.  Patient has long history of essential tremor, not responsive to medications.  She was offered DBS evaluation in the past but opted to hold off.    REVIEW OF SYSTEMS: Full 14 system review of systems performed and negative with exception of: as per HPI.  ALLERGIES: Allergies  Allergen Reactions   Codeine Other (See Comments)    "Increases heart rate"    Morphine And Related Other (See Comments)    Hallucinations    Nsaids Other (See Comments)    Hx  GI bleed   Valium [Diazepam] Other (See Comments)    Patient states that this medication made her want to commit sucide    HOME MEDICATIONS: Outpatient Medications Prior to Visit  Medication Sig Dispense Refill   aspirin 81 MG chewable tablet Chew 1 tablet  (81 mg total) by mouth daily.     atorvastatin (LIPITOR) 40 MG tablet Take 1 tablet (40 mg total) by mouth daily. 30 tablet 1   clonazePAM (KLONOPIN) 0.5 MG tablet Take 0.5 mg by mouth 4 (four) times daily as needed.     clopidogrel (PLAVIX) 75 MG tablet Take 1 tablet (75 mg total) by mouth daily. 30 tablet 2   diltiazem (CARDIZEM CD) 180 MG 24 hr capsule Take 180 mg by mouth daily.     empagliflozin (JARDIANCE) 25 MG TABS tablet Take by mouth daily.     ergocalciferol (VITAMIN D2) 50000 UNITS capsule Take 50,000 Units by mouth once a week.     gabapentin (NEURONTIN) 100 MG capsule Take 100 mg by mouth 2 (two) times daily.     glipiZIDE (GLUCOTROL XL) 10 MG 24 hr tablet Take 10 mg by mouth daily with breakfast.     hydrALAZINE (APRESOLINE) 50 MG tablet Take 1 tablet (50 mg total) by mouth every 8 (eight) hours. 90 tablet 1   levothyroxine (SYNTHROID, LEVOTHROID) 50 MCG tablet Take 50 mcg by mouth daily.     lisinopril (ZESTRIL) 10 MG tablet Take 10 mg by mouth daily.     metoprolol succinate (TOPROL-XL) 50 MG 24 hr tablet Take 1 tablet (50 mg total) by mouth daily. Take with or immediately following a meal. 30 tablet 1   pantoprazole (PROTONIX) 40 MG tablet TAKE 1 TABLET BY MOUTH EVERY DAY IN THE MORNING (Patient taking differently: Take 40  mg by mouth daily.) 90 tablet 3   traMADol (ULTRAM) 50 MG tablet Take 50 mg by mouth every 6 (six) hours as needed for moderate pain.     glipiZIDE (GLUCOTROL XL) 5 MG 24 hr tablet Take 10 mg by mouth daily.   4   METOPROLOL TARTRATE PO Take 25 mg by mouth daily.     No facility-administered medications prior to visit.    PAST MEDICAL HISTORY: Past Medical History:  Diagnosis Date   Anxiety    Benign essential tremor    Cardiac murmur    Reportedly for years-mild   Chronic diarrhea    Degenerative joint disease    Diabetes mellitus    Diabetic neuropathy (HCC)    Diverticulosis    Essential hypertension, benign    GERD (gastroesophageal reflux  disease)    GI bleed    NSAID use   PONV (postoperative nausea and vomiting)    Seasonal allergies    Shortness of breath    TIA (transient ischemic attack)    1998   Type 2 diabetes mellitus ([place])    Urinary incontinence     PAST SURGICAL HISTORY: Past Surgical History:  Procedure Laterality Date   ABDOMINAL HYSTERECTOMY     APPENDECTOMY     Arthroscopic left shoulder surgery     BREAST SURGERY     lumpectomy X 2-benign   CARPAL TUNNEL RELEASE Left 10/03/2012   Procedure: CARPAL TUNNEL RELEASE;  Surgeon: Cammie Sickle., MD;  Location: [facility];  Service: Orthopedics;  Laterality: Left;   CHOLECYSTECTOMY     COLON SURGERY     COLONOSCOPY  03/03/09   COLONOSCOPY  09/16/2004   ROURK   Colostomy with reversal     ESOPHAGOGASTRODUODENOSCOPY     EGD 06/08/2008   ESOPHAGOGASTRODUODENOSCOPY  09/16/04   DIAG EGD W/TCS   INCISIONAL HERNIA REPAIR N/A 12/25/2021   Procedure: HERNIA REPAIR INCISIONAL;  Surgeon: Benjamine Sprague, DO;  Location: [facility];  Service: General;  Laterality: N/A;   REPLACEMENT TOTAL KNEE BILATERAL  2001, 2005   Right elbow surgery     TENDON RECONSTRUCTION Left 10/03/2012   Procedure: LEFT MEDIAL ELBOW RECONSTRUCTION;  Surgeon: Cammie Sickle., MD;  Location: [facility];  Service: Orthopedics;  Laterality: Left;   TONSILLECTOMY      FAMILY HISTORY: Family History  Problem Relation Age of Onset   Cancer Father        Leukemia   Stroke Paternal Grandfather    Colon cancer Neg Hx     SOCIAL HISTORY: Social History   Socioeconomic History   Marital status: Married    Spouse name: Not on file   Number of children: Not on file   Years of education: Not on file   Highest education level: GED or equivalent  Occupational History   Occupation: Disabled  Tobacco Use   Smoking status: Former    Packs/day: 2.00    Years: 40.00    Total pack years: 80.00    Types: Cigarettes    Quit date: 11/02/1997    Years since  quitting: 24.2   Smokeless tobacco: Never   Tobacco comments:    quit June [id number]  Vaping Use   Vaping Use: Never used  Substance and Sexual Activity   Alcohol use: No    Alcohol/week: 0.0 standard drinks of alcohol   Drug use: No   Sexual activity: Not on file  Other Topics Concern   Not on  file  Social History Narrative   Lives with husband   Right handed   Caffeine: none    Social Determinants of Radio broadcast assistant Strain: Not on file  Food Insecurity: Not on file  Transportation Needs: Not on file  Physical Activity: Not on file  Stress: Not on file  Social Connections: Not on file  Intimate Partner Violence: Not on file     PHYSICAL EXAM  GENERAL EXAM/CONSTITUTIONAL: Vitals:  Vitals:   02/05/22 1052  BP: (!) 172/90  Pulse: 62  SpO2: 95%  Weight: 161 lb (73 kg)  Height: '5\' 2"'$  (1.575 m)   Body mass index is 29.45 kg/m. Wt Readings from Last 3 Encounters:  02/05/22 161 lb (73 kg)  02/03/22 161 lb 6.4 oz (73.2 kg)  12/26/21 179 lb 7.3 oz (81.4 kg)   Patient is in no distress; well developed, nourished and groomed; neck is supple  CARDIOVASCULAR: Examination of carotid arteries is normal; no carotid bruits Regular rate and rhythm, no murmurs Examination of peripheral vascular system by observation and palpation is normal  EYES: Ophthalmoscopic exam of optic discs and posterior segments is normal; no papilledema or hemorrhages No results found.  MUSCULOSKELETAL: Gait, strength, tone, movements noted in Neurologic exam below  NEUROLOGIC: MENTAL STATUS:      No data to display         awake, alert, oriented to person, place and time recent and remote memory intact normal attention and concentration language fluent, comprehension intact, naming intact fund of knowledge appropriate  CRANIAL NERVE:  2nd - no papilledema on fundoscopic exam 2nd, 3rd, 4th, 6th - pupils equal and reactive to light, visual fields --> LEFT SUPERIOR  QUADRANTANOPSIA; DECR UP GAZE, no nystagmus 5th - facial sensation symmetric 7th - facial strength symmetric 8th - hearing intact 9th - palate elevates symmetrically, uvula midline 11th - shoulder shrug symmetric 12th - tongue protrusion midline  MOTOR:  normal bulk and tone, full strength in the BUE, BLE; BILATERAL FOOT DROP  SENSORY:  normal and symmetric to light touch  COORDINATION:  finger-nose-finger, fine finger movements normal  REFLEXES:  deep tendon reflexes TRACE and symmetric  GAIT/STATION:  narrow based gait; STEPPAGE GAIT; USING WALKER     DIAGNOSTIC DATA (LABS, IMAGING, TESTING) - I reviewed patient records, labs, notes, testing and imaging myself where available.  Lab Results  Component Value Date   WBC 10.0 12/29/2021   HGB 14.5 12/29/2021   HCT 46.7 (H) 12/29/2021   MCV 85.5 12/29/2021   PLT 382 12/29/2021      Component Value Date/Time   NA 139 12/29/2021 0353   K 3.8 12/29/2021 0353   CL 108 12/29/2021 0353   CO2 23 12/29/2021 0353   GLUCOSE 135 (H) 12/29/2021 0353   BUN 26 (H) 12/29/2021 0353   CREATININE 1.58 (H) 12/29/2021 0353   CREATININE 1.10 10/16/2013 1554   CALCIUM 8.8 (L) 12/29/2021 0353   PROT 6.4 (L) 12/26/2021 0641   ALBUMIN 3.4 (L) 12/26/2021 0641   AST 15 12/26/2021 0641   ALT 10 12/26/2021 0641   ALKPHOS 63 12/26/2021 0641   BILITOT 0.7 12/26/2021 0641   GFRNONAA 33 (L) 12/29/2021 0353   GFRAA 71 (L) 10/02/2012 1420   Lab Results  Component Value Date   CHOL 156 12/29/2021   HDL 33 (L) 12/29/2021   LDLCALC 83 12/29/2021   TRIG 199 (H) 12/29/2021   CHOLHDL 4.7 12/29/2021   Lab Results  Component Value Date   HGBA1C  8.0 (H) 12/25/2021   No results found for: "VITAMINB12" No results found for: "TSH"   12/29/21 MRI brain / MRA head [I reviewed images myself and agree with interpretation. -[facility]]  1. Moderate-to-large infarct in the right MCA territory involving the right temporal lobe, anterior occipital lobe, and  anteroinferior parietal lobe, which is likely subacute given associated acute to early subacute petechial hemorrhage and areas of contrast enhancement. 2. Evaluation of the intracranial vasculature on MRA is limited motion artifact. Within this limitation, there is mild-to-moderate stenosis in the right M1 segment and multifocal poor perfusion in several right M2 and M3 branches. Additional mild irregularity in the right M2 branches and distal ACAs. Additional poor signal in the proximal right P2 may be artifactual. If there is a clinical indication for more definitive evaluation of the intracranial vasculature, a CTA of the head is recommended.  12/29/21 TTE   1. Left ventricular ejection fraction, by estimation, is 60 to 65%. The  left ventricle has normal function. The left ventricle has no regional  wall motion abnormalities. There is moderate left ventricular hypertrophy.  Left ventricular diastolic  parameters are consistent with Grade I diastolic dysfunction (impaired  relaxation).   2. Right ventricular systolic function is normal. The right ventricular  size is normal. There is normal pulmonary artery systolic pressure.   3. Left atrial size was mildly dilated.   4. The mitral valve is normal in structure. No evidence of mitral valve  regurgitation.   5. The aortic valve is tricuspid. Aortic valve regurgitation is not  visualized. Aortic valve sclerosis/calcification is present, without any  evidence of aortic stenosis.   6. The inferior vena cava is normal in size with greater than 50%  respiratory variability, suggesting right atrial pressure of 3 mmHg.   12/29/21 carotid u/s 1. Atherosclerotic plaque involving bilateral carotid arteries. Estimated degree of stenosis in the internal carotid arteries is less than 50% bilaterally. 2. Cannot exclude stenosis involving the right external carotid artery. 3. Left vertebral artery is not visualized. 4. Right vertebral artery is  patent with antegrade flow.    ASSESSMENT AND PLAN  76 y.o. year old female here with:   Dx:  1. Chronic ischemic right MCA stroke      PLAN:  RIGHT TEMPORAL ISCHEMIC STROKE (likely artery-artery embolism; intracranial atherosclerosis) - continue aspirin '81mg'$  + plavix 75 daily x 3 months post stroke; then aspirin '81mg'$  daily alone - continue atorvastatin 40, DM and BP control - caution with medications and being alone; no driving   MEMORY LOSS - gradual progressive memory loss since past 1-2 years; worse since stroke; concern for mild dementia - safety / supervision issues reviewed - daily physical activity / exercise (at least 15-30 minutes) - eat more plants / vegetables - increase social activities, brain stimulation, games, puzzles, hobbies, crafts, arts, music - aim for at least 7-8 hours sleep per night (or more) - avoid smoking and alcohol - caregiver resources provided - caution with medications, finances; no driving  ESSENTIAL TREMOR - consider referral to [facility] clinic for DBS evaluation  Return for return to PCP, pending if symptoms worsen or fail to improve.  I spent 60 minutes of face-to-face and non-face-to-face time with patient.  This included previsit chart review, lab review, study review, order entry, electronic health record documentation, patient education.     Penni Bombard, MD 06/07/7260, 03:55 AM Certified in Neurology, Neurophysiology and Neuroimaging  [facility] [address] [place], [place] [address] [phone]  [phone]  

## 2022-02-05 NOTE — Patient Instructions (Addendum)
  RIGHT TEMPORAL STROKE (likely artery-artery embolism) - continue aspirin '81mg'$  + plavix 75 daily x 3 months post stroke; then aspirin '81mg'$  daily alone - continue atorvastatin 40, DM and BP control - caution with medications and being alone; no driving   MEMORY LOSS - gradual progressive memory loss since past 1-2 years; worse since stroke; concern for mild dementia - safety / supervision issues reviewed - daily physical activity / exercise (at least 15-30 minutes) - eat more plants / vegetables - increase social activities, brain stimulation, games, puzzles, hobbies, crafts, arts, music - aim for at least 7-8 hours sleep per night (or more) - avoid smoking and alcohol - caregiver resources provided - caution with medications, finances; no driving  ESSENTIAL TREMOR - consider referral to [facility] clinic for DBS evaluation

## 2022-04-07 ENCOUNTER — Ambulatory Visit: Payer: PPO | Admitting: "Endocrinology

## 2022-04-19 ENCOUNTER — Telehealth: Payer: Self-pay | Admitting: Diagnostic Neuroimaging

## 2022-04-19 NOTE — Telephone Encounter (Signed)
Contacted daughter back, she stated the confusion isn't as bad as the agitation is. Pt is very hateful and mean towards the family and feels she has a right to be mad. I advised her some pts have a hard tome coping with the fact that they have had a stroke. Advised to discuss these issues with PCP and see if there is some underlying issues going on that she may need to see Psychiatry for as well. Pt daughter stated she will discuss concerns with PCP and see if psych eval is needed, she was appreciative for the call back .

## 2022-04-19 NOTE — Telephone Encounter (Signed)
Pt's daughter is asking if pt can be put on a mood stabilizer of some kind as a result of pt being agitated, confused and getting angry quickly since stroke

## 2022-05-05 ENCOUNTER — Encounter: Payer: Self-pay | Admitting: "Endocrinology

## 2022-05-05 ENCOUNTER — Ambulatory Visit: Payer: PPO | Admitting: "Endocrinology

## 2022-05-05 VITALS — BP 150/68 | HR 76 | Ht 62.0 in | Wt 165.0 lb

## 2022-05-05 DIAGNOSIS — I10 Essential (primary) hypertension: Secondary | ICD-10-CM

## 2022-05-05 DIAGNOSIS — E039 Hypothyroidism, unspecified: Secondary | ICD-10-CM

## 2022-05-05 DIAGNOSIS — Z6830 Body mass index (BMI) 30.0-30.9, adult: Secondary | ICD-10-CM

## 2022-05-05 DIAGNOSIS — E782 Mixed hyperlipidemia: Secondary | ICD-10-CM

## 2022-05-05 DIAGNOSIS — E1122 Type 2 diabetes mellitus with diabetic chronic kidney disease: Secondary | ICD-10-CM

## 2022-05-05 DIAGNOSIS — N1832 Chronic kidney disease, stage 3b: Secondary | ICD-10-CM | POA: Diagnosis not present

## 2022-05-05 DIAGNOSIS — E6609 Other obesity due to excess calories: Secondary | ICD-10-CM

## 2022-05-05 LAB — POCT GLYCOSYLATED HEMOGLOBIN (HGB A1C): HbA1c, POC (controlled diabetic range): 9 % — AB (ref 0.0–7.0)

## 2022-05-05 MED ORDER — BD PEN NEEDLE NANO U/F 32G X 4 MM MISC: 1.0000 | Freq: Four times a day (QID) | 2 refills | Status: DC

## 2022-05-05 MED ORDER — TRESIBA FLEXTOUCH 200 UNIT/ML ~~LOC~~ SOPN: 20.0000 [IU] | PEN_INJECTOR | Freq: Every day | SUBCUTANEOUS | 2 refills | Status: DC

## 2022-05-05 NOTE — Progress Notes (Unsigned)
Endocrinology follow-up note       05/05/2022, 8:12 PM   Subjective:    Patient ID: [name], female    DOB: 18-Apr-1946.  [name] is being seen in follow-up after she was seen in consultation for management of currently uncontrolled symptomatic diabetes requested by  Glenda Chroman, MD.   Past Medical History:  Diagnosis Date   Anxiety    Benign essential tremor    Cardiac murmur    Reportedly for years-mild   Chronic diarrhea    Degenerative joint disease    Diabetes mellitus    Diabetic neuropathy ([place])    Diverticulosis    Essential hypertension, benign    GERD (gastroesophageal reflux disease)    GI bleed    NSAID use   PONV (postoperative nausea and vomiting)    Seasonal allergies    Shortness of breath    TIA (transient ischemic attack)    1998   Type 2 diabetes mellitus ([place])    Urinary incontinence     Past Surgical History:  Procedure Laterality Date   ABDOMINAL HYSTERECTOMY     APPENDECTOMY     Arthroscopic left shoulder surgery     BREAST SURGERY     lumpectomy X 2-benign   CARPAL TUNNEL RELEASE Left 10/03/2012   Procedure: CARPAL TUNNEL RELEASE;  Surgeon: Cammie Sickle., MD;  Location: [facility];  Service: Orthopedics;  Laterality: Left;   CHOLECYSTECTOMY     COLON SURGERY     COLONOSCOPY  03/03/09   COLONOSCOPY  09/16/2004   ROURK   Colostomy with reversal     ESOPHAGOGASTRODUODENOSCOPY     EGD 06/08/2008   ESOPHAGOGASTRODUODENOSCOPY  09/16/04   DIAG EGD W/TCS   INCISIONAL HERNIA REPAIR N/A 12/25/2021   Procedure: HERNIA REPAIR INCISIONAL;  Surgeon: Benjamine Sprague, DO;  Location: [facility];  Service: General;  Laterality: N/A;   REPLACEMENT TOTAL KNEE BILATERAL  2001, 2005   Right elbow surgery     TENDON RECONSTRUCTION Left 10/03/2012   Procedure: LEFT MEDIAL ELBOW RECONSTRUCTION;  Surgeon: Cammie Sickle., MD;  Location: [facility];  Service: Orthopedics;  Laterality: Left;   TONSILLECTOMY      Social History   Socioeconomic History   Marital status: Married    Spouse name: Not on file   Number of children: Not on file   Years of education: Not on file   Highest education level: GED or equivalent  Occupational History   Occupation: Disabled  Tobacco Use   Smoking status: Former    Packs/day: 2.00    Years: 40.00    Total pack years: 80.00    Types: Cigarettes    Quit date: 11/02/1997    Years since quitting: 24.5   Smokeless tobacco: Never   Tobacco comments:    quit June [id number]  Vaping Use   Vaping Use: Never used  Substance and Sexual Activity   Alcohol use: No    Alcohol/week: 0.0 standard drinks of alcohol   Drug use: No   Sexual activity: Not on file  Other Topics Concern   Not on file  Social History Narrative   Lives with husband   Right handed   Caffeine: none    Social Determinants of Radio broadcast assistant Strain: Not on file  Food Insecurity: Not on file  Transportation Needs: Not on file  Physical Activity: Not on file  Stress: Not on file  Social Connections: Not on file    Family History  Problem Relation Age of Onset   Cancer Father        Leukemia   Stroke Paternal Grandfather    Colon cancer Neg Hx     Outpatient Encounter Medications as of 05/05/2022  Medication Sig   insulin degludec (TRESIBA FLEXTOUCH) 200 UNIT/ML FlexTouch Pen Inject 20 Units into the skin at bedtime.   Insulin Pen Needle (BD PEN NEEDLE NANO U/F) 32G X 4 MM MISC 1 each by Does not apply route 4 (four) times daily.   aspirin 81 MG chewable tablet Chew 1 tablet (81 mg total) by mouth daily.   atorvastatin (LIPITOR) 40 MG tablet Take 1 tablet (40 mg total) by mouth daily.   clonazePAM (KLONOPIN) 0.5 MG tablet Take 0.5 mg by mouth 4 (four) times daily as needed.   clopidogrel (PLAVIX) 75 MG tablet Take 1 tablet (75 mg total) by mouth daily. (Patient not taking: Reported on  05/05/2022)   diltiazem (CARDIZEM CD) 180 MG 24 hr capsule Take 180 mg by mouth daily.   empagliflozin (JARDIANCE) 25 MG TABS tablet Take by mouth daily.   ergocalciferol (VITAMIN D2) 50000 UNITS capsule Take 50,000 Units by mouth once a week.   gabapentin (NEURONTIN) 100 MG capsule Take 100 mg by mouth 2 (two) times daily.   glipiZIDE (GLUCOTROL XL) 10 MG 24 hr tablet Take 10 mg by mouth daily with breakfast.   hydrALAZINE (APRESOLINE) 50 MG tablet Take 1 tablet (50 mg total) by mouth every 8 (eight) hours.   levothyroxine (SYNTHROID, LEVOTHROID) 50 MCG tablet Take 50 mcg by mouth daily.   lisinopril (ZESTRIL) 10 MG tablet Take 10 mg by mouth daily.   metoprolol succinate (TOPROL-XL) 50 MG 24 hr tablet Take 1 tablet (50 mg total) by mouth daily. Take with or immediately following a meal.   pantoprazole (PROTONIX) 40 MG tablet TAKE 1 TABLET BY MOUTH EVERY DAY IN THE MORNING (Patient taking differently: Take 40 mg by mouth daily.)   traMADol (ULTRAM) 50 MG tablet Take 50 mg by mouth every 6 (six) hours as needed for moderate pain.   No facility-administered encounter medications on file as of 05/05/2022.    ALLERGIES: Allergies  Allergen Reactions   Codeine Other (See Comments)    "Increases heart rate"    Morphine And Related Other (See Comments)    Hallucinations    Nsaids Other (See Comments)    Hx  GI bleed   Valium [Diazepam] Other (See Comments)    Patient states that this medication made her want to commit sucide    VACCINATION STATUS:  There is no immunization history on file for this patient.  Diabetes She presents for her follow-up diabetic visit. She has type 2 diabetes mellitus. Onset time: She was diagnosed at approximate age of 61 years. Her disease course has been worsening. There are no hypoglycemic associated symptoms. Pertinent negatives for hypoglycemia include no confusion, headaches, pallor or seizures. Associated symptoms include fatigue, polydipsia and  polyuria. Pertinent negatives for diabetes include no chest pain and no polyphagia. There are no hypoglycemic complications. Symptoms are worsening. Diabetic complications include peripheral neuropathy. Risk factors  for coronary artery disease include dyslipidemia, diabetes mellitus, family history, hypertension, post-menopausal, sedentary lifestyle and tobacco exposure. Current diabetic treatment includes oral agent (dual therapy). Her weight is fluctuating minimally. She is following a generally unhealthy diet. When asked about meal planning, she reported none. Her home blood glucose trend is increasing steadily. Her breakfast blood glucose range is generally >200 mg/dl. Her lunch blood glucose range is generally >200 mg/dl. Her dinner blood glucose range is generally >200 mg/dl. Her bedtime blood glucose range is generally >200 mg/dl. Her overall blood glucose range is >200 mg/dl. (She wears a CGM device showing average blood glucose of 259 mg per DL.  Her AGP report shows 19% time in range, 31% level 1 hyperglycemia, 50% level 2 hyperglycemia.  Admittedly, she continued to consume large quantities of sweetened beverages.  Her point-of-care A1c is 9%, increasing from 8%.      ) An ACE inhibitor/angiotensin II receptor blocker is being taken.  Hyperlipidemia This is a chronic problem. The current episode started more than 1 year ago. The problem is uncontrolled. Recent lipid tests were reviewed and are variable. Pertinent negatives include no chest pain, myalgias or shortness of breath. Current antihyperlipidemic treatment includes statins. Risk factors for coronary artery disease include dyslipidemia, diabetes mellitus, hypertension, a sedentary lifestyle, post-menopausal and family history.  Hypertension This is a chronic problem. Pertinent negatives include no chest pain, headaches, palpitations or shortness of breath. Risk factors for coronary artery disease include dyslipidemia, diabetes mellitus,  smoking/tobacco exposure and sedentary lifestyle. Past treatments include ACE inhibitors.     Review of Systems  Constitutional:  Positive for fatigue. Negative for chills, fever and unexpected weight change.  HENT:  Negative for trouble swallowing and voice change.   Eyes:  Negative for visual disturbance.  Respiratory:  Negative for cough, shortness of breath and wheezing.   Cardiovascular:  Negative for chest pain, palpitations and leg swelling.  Gastrointestinal:  Negative for diarrhea, nausea and vomiting.  Endocrine: Positive for polydipsia and polyuria. Negative for cold intolerance, heat intolerance and polyphagia.  Musculoskeletal:  Negative for arthralgias and myalgias.  Skin:  Negative for color change, pallor, rash and wound.  Neurological:  Negative for seizures and headaches.  Psychiatric/Behavioral:  Negative for confusion and suicidal ideas.     Objective:       05/05/2022   11:29 AM 02/05/2022   10:52 AM 02/03/2022    9:22 AM  Vitals with BMI  Height '5\' 2"'$  '5\' 2"'$  '5\' 2"'$   Weight 165 lbs 161 lbs 161 lbs 6 oz  BMI 30.17 47.09 62.83  Systolic 662 947 654  Diastolic 68 90 66  Pulse 76 62 60    BP (!) 150/68   Pulse 76   Ht '5\' 2"'$  (1.575 m)   Wt 165 lb (74.8 kg)   BMI 30.18 kg/m   Wt Readings from Last 3 Encounters:  05/05/22 165 lb (74.8 kg)  02/05/22 161 lb (73 kg)  02/03/22 161 lb 6.4 oz (73.2 kg)      CMP ( most recent) CMP     Component Value Date/Time   NA 139 12/29/2021 0353   K 3.8 12/29/2021 0353   CL 108 12/29/2021 0353   CO2 23 12/29/2021 0353   GLUCOSE 135 (H) 12/29/2021 0353   BUN 26 (H) 12/29/2021 0353   CREATININE 1.58 (H) 12/29/2021 0353   CREATININE 1.10 10/16/2013 1554   CALCIUM 8.8 (L) 12/29/2021 0353   PROT 6.4 (L) 12/26/2021 0641   ALBUMIN 3.4 (L)  12/26/2021 0641   AST 15 12/26/2021 0641   ALT 10 12/26/2021 0641   ALKPHOS 63 12/26/2021 0641   BILITOT 0.7 12/26/2021 0641   GFRNONAA 33 (L) 12/29/2021 0353   GFRAA 71 (L)  10/02/2012 1420     Diabetic Labs (most recent): Lab Results  Component Value Date   HGBA1C 9.0 (A) 05/05/2022   HGBA1C 8.0 (H) 12/25/2021     Lipid Panel ( most recent) Lipid Panel     Component Value Date/Time   CHOL 156 12/29/2021 0743   TRIG 199 (H) 12/29/2021 0743   HDL 33 (L) 12/29/2021 0743   CHOLHDL 4.7 12/29/2021 0743   VLDL 40 12/29/2021 0743   LDLCALC 83 12/29/2021 0743      Assessment & Plan:   1. Type 2 diabetes mellitus with stage 3b chronic kidney disease, without long-term current use of insulin ([place])    - [name] has currently uncontrolled symptomatic type 2 DM since  76 years of age.  She wears a CGM device showing average blood glucose of 259 mg per DL.  Her AGP report shows 19% time in range, 31% level 1 hyperglycemia, 50% level 2 hyperglycemia.  Admittedly, she continued to consume large quantities of sweetened beverages.  Her point-of-care A1c is 9%, increasing from 8%.  She is accompanied by her daughter who corroborates story.   Recent labs reviewed. - I had a long discussion with her about the possible risk factors and  the pathology behind its diabetes and its complications. -her diabetes is complicated by sedentary life, history of smoking and she remains at a high risk for more acute and chronic complications which include CAD, CVA, CKD, retinopathy, and neuropathy. These are all discussed in detail with her.  - I discussed all available options of managing her diabetes including de-escalation of medications. I have counseled her on Food as Medicine by adopting a Whole Food , Plant Predominant  ( WFPP) nutrition as recommended by [employer] of Lifestyle Medicine. Patient is encouraged to switch to  unprocessed or minimally processed  complex starch, adequate protein intake (mainly plant source), minimal liquid fat, plenty of fruits, and vegetables. -  she is advised to stick to a routine mealtimes to eat 3 complete meals a day and  snack only when necessary ( to snack only to correct hypoglycemia BG <70 day time or <100 at night).   - she made it clear that she will not change her dietary habits and would rather be on insulin.    - she has been scheduled with Jearld [facility], RDN, CDE for individualized diabetes education.  - I have approached her with the following individualized plan to manage  her diabetes and patient agrees:   -Patient is hesitant to engage in lifestyle medicine.  In fact, she is open for initiation of insulin treatment.  With the help of her daughter who is here in the clinic today with her, she thinks she can manage the insulin treatment.  I discussed and prescribed Tresiba 20 units nightly associated with utility of her CGM continuously.   -She is encouraged to call clinic for blood glucose readings below 70 or above 300 mg per DL.  In the meantime, she is advised to continue Jardiance 25 mg p.o. daily, glipizide 10 mg p.o. once a day.     - Specific targets for  A1c;  LDL, HDL,  and Triglycerides were discussed with the patient.  2) Blood Pressure /Hypertension: Her blood pressure is not  controlled to target.   she is advised to continue her current medications including hydralazine 50 mg p.o. 3 times daily, lisinopril 10 mg p.o. daily, metoprolol 25 mg p.o. daily.    3) Lipids/Hyperlipidemia:   Review of her recent lipid panel showed  controlled  LDL at 88 .  she  is advised to continue atorvastatin 40 mg p.o. daily at bedtime. Side effects and precautions discussed with her.  4)  Weight/Diet:  Body mass index is 30.18 kg/m.  -   clearly complicating her diabetes care.   she is  a candidate for weight loss. I discussed with her the fact that loss of 5 - 10% of her  current body weight will have the most impact on her diabetes management.  The above detailed  ACLM recommendations for nutrition, exercise, sleep, social life, avoidance of risky substances, the need for restorative sleep   information  will also detailed on discharge instructions.  5) hypothyroidism: The circumstance of her diagnosis is not available to review. She is advised to continue levothyroxine 50 mcg p.o. daily before breakfast.  - We discussed about the correct intake of her thyroid hormone, on empty stomach at fasting, with water, separated by at least 30 minutes from breakfast and other medications,  and separated by more than 4 hours from calcium, iron, multivitamins, acid reflux medications (PPIs). -Patient is made aware of the fact that thyroid hormone replacement is needed for life, dose to be adjusted by periodic monitoring of thyroid function tests.  6) Chronic Care/Health Maintenance:  -she  is on ACEI/ARB and Statin medications and  is encouraged to initiate and continue to follow up with Ophthalmology, Dentist,  Podiatrist at least yearly or according to recommendations, and advised to   stay away from smoking. I have recommended yearly flu vaccine and pneumonia vaccine at least every 5 years; moderate intensity exercise for up to 150 minutes weekly; and  sleep for 7- 9 hours a day.  - she is  advised to maintain close follow up with Glenda Chroman, MD for primary care needs, as well as her other providers for optimal and coordinated care.   I spent 41 minutes in the care of the patient today including review of labs from [facility], Lipids, Thyroid Function, Hematology (current and previous including abstractions from other facilities); face-to-face time discussing  her blood glucose readings/logs, discussing hypoglycemia and hyperglycemia episodes and symptoms, medications doses, her options of short and long term treatment based on the latest standards of care / guidelines;  discussion about incorporating lifestyle medicine;  and documenting the encounter. Risk reduction counseling performed per USPSTF guidelines to reduce  obesity and cardiovascular risk factors.     Please refer to Patient Instructions for Blood  Glucose Monitoring and Insulin/Medications Dosing Guide"  in media tab for additional information. Please  also refer to " Patient Self Inventory" in the Media  tab for reviewed elements of pertinent patient history.  [name] participated in the discussions, expressed understanding, and voiced agreement with the above plans.  All questions were answered to her satisfaction. she is encouraged to contact clinic should she have any questions or concerns prior to her return visit.   Follow up plan: - Return in about 2 weeks (around 05/19/2022) for F/U with Meter/CGM [facility] Only - no Labs.  Glade Lloyd, MD [facility] [address] [place], [place] [address] Phone: [phone]  Fax: [phone]    05/05/2022, 8:12 PM  This note was partially dictated with voice recognition software. Similar sounding words can be transcribed inadequately or may not  be corrected upon review.

## 2022-05-05 NOTE — Patient Instructions (Signed)

## 2022-05-06 DIAGNOSIS — E6609 Other obesity due to excess calories: Secondary | ICD-10-CM | POA: Insufficient documentation

## 2022-05-20 ENCOUNTER — Telehealth: Payer: Self-pay | Admitting: "Endocrinology

## 2022-05-20 NOTE — Telephone Encounter (Signed)
Patient's daughter came by and said that her sugar in the mornings has been in the 60's since the last appt which was 12/13. She said that she has been taking 20 units at bedtime. She has potatoes and a dessert last night and it was in the 80's this morning. Right now she has checked it and it is 200 and has not had lunch. Please advise   Call back [phone]. Her daughter said she could not get her mom to pull the readings. She said she is only checking it when she gets up and she has asked her many times to check it as you have instructed.

## 2022-05-20 NOTE — Telephone Encounter (Signed)
Discussed with pt's daughter, understanding voiced. °

## 2022-05-25 ENCOUNTER — Other Ambulatory Visit: Payer: Self-pay

## 2022-05-25 ENCOUNTER — Emergency Department: Payer: PPO

## 2022-05-25 ENCOUNTER — Encounter: Payer: Self-pay | Admitting: *Deleted

## 2022-05-25 DIAGNOSIS — R519 Headache, unspecified: Secondary | ICD-10-CM | POA: Diagnosis not present

## 2022-05-25 DIAGNOSIS — N1832 Chronic kidney disease, stage 3b: Secondary | ICD-10-CM | POA: Insufficient documentation

## 2022-05-25 DIAGNOSIS — R451 Restlessness and agitation: Secondary | ICD-10-CM | POA: Diagnosis not present

## 2022-05-25 DIAGNOSIS — E039 Hypothyroidism, unspecified: Secondary | ICD-10-CM | POA: Diagnosis not present

## 2022-05-25 DIAGNOSIS — F419 Anxiety disorder, unspecified: Secondary | ICD-10-CM | POA: Diagnosis not present

## 2022-05-25 DIAGNOSIS — N3090 Cystitis, unspecified without hematuria: Secondary | ICD-10-CM | POA: Insufficient documentation

## 2022-05-25 DIAGNOSIS — N390 Urinary tract infection, site not specified: Secondary | ICD-10-CM | POA: Diagnosis not present

## 2022-05-25 DIAGNOSIS — Z7982 Long term (current) use of aspirin: Secondary | ICD-10-CM | POA: Insufficient documentation

## 2022-05-25 DIAGNOSIS — Z8673 Personal history of transient ischemic attack (TIA), and cerebral infarction without residual deficits: Secondary | ICD-10-CM | POA: Insufficient documentation

## 2022-05-25 DIAGNOSIS — Z79899 Other long term (current) drug therapy: Secondary | ICD-10-CM | POA: Diagnosis not present

## 2022-05-25 DIAGNOSIS — I639 Cerebral infarction, unspecified: Secondary | ICD-10-CM | POA: Insufficient documentation

## 2022-05-25 DIAGNOSIS — Z87891 Personal history of nicotine dependence: Secondary | ICD-10-CM | POA: Diagnosis not present

## 2022-05-25 DIAGNOSIS — I129 Hypertensive chronic kidney disease with stage 1 through stage 4 chronic kidney disease, or unspecified chronic kidney disease: Secondary | ICD-10-CM | POA: Diagnosis not present

## 2022-05-25 DIAGNOSIS — F329 Major depressive disorder, single episode, unspecified: Secondary | ICD-10-CM | POA: Diagnosis not present

## 2022-05-25 DIAGNOSIS — Z7984 Long term (current) use of oral hypoglycemic drugs: Secondary | ICD-10-CM | POA: Diagnosis not present

## 2022-05-25 DIAGNOSIS — R251 Tremor, unspecified: Secondary | ICD-10-CM | POA: Diagnosis not present

## 2022-05-25 DIAGNOSIS — F32A Depression, unspecified: Secondary | ICD-10-CM | POA: Diagnosis not present

## 2022-05-25 DIAGNOSIS — E1122 Type 2 diabetes mellitus with diabetic chronic kidney disease: Secondary | ICD-10-CM | POA: Diagnosis not present

## 2022-05-25 LAB — CBC
HCT: 52 % — ABNORMAL HIGH (ref 36.0–46.0)
Hemoglobin: 16.3 g/dL — ABNORMAL HIGH (ref 12.0–15.0)
MCH: 28.5 pg (ref 26.0–34.0)
MCHC: 31.3 g/dL (ref 30.0–36.0)
MCV: 91.1 fL (ref 80.0–100.0)
Platelets: 641 10*3/uL — ABNORMAL HIGH (ref 150–400)
RBC: 5.71 MIL/uL — ABNORMAL HIGH (ref 3.87–5.11)
RDW: 18.3 % — ABNORMAL HIGH (ref 11.5–15.5)
WBC: 18 10*3/uL — ABNORMAL HIGH (ref 4.0–10.5)
nRBC: 0 % (ref 0.0–0.2)

## 2022-05-25 LAB — BASIC METABOLIC PANEL
Anion gap: 12 (ref 5–15)
BUN: 48 mg/dL — ABNORMAL HIGH (ref 8–23)
CO2: 18 mmol/L — ABNORMAL LOW (ref 22–32)
Calcium: 9.3 mg/dL (ref 8.9–10.3)
Chloride: 106 mmol/L (ref 98–111)
Creatinine, Ser: 1.46 mg/dL — ABNORMAL HIGH (ref 0.44–1.00)
GFR, Estimated: 37 mL/min — ABNORMAL LOW (ref >60)
Glucose, Bld: 120 mg/dL — ABNORMAL HIGH (ref 70–99)
Potassium: 4.1 mmol/L (ref 3.5–5.1)
Sodium: 136 mmol/L (ref 135–145)

## 2022-05-25 LAB — ETHANOL: Alcohol, Ethyl (B): <10 mg/dL (ref <10)

## 2022-05-25 LAB — ACETAMINOPHEN LEVEL: Acetaminophen (Tylenol), Serum: <10 ug/mL — ABNORMAL LOW (ref 10–30)

## 2022-05-25 LAB — SALICYLATE LEVEL: Salicylate Lvl: <7 mg/dL — ABNORMAL LOW (ref 7.0–30.0)

## 2022-05-25 NOTE — ED Triage Notes (Addendum)
Pt brought in by [facility].  Pt is IVC from home.  Pt states she has a headache.  Pt holding a cloth over her eyes and face.   Pt reports SI.  Pt denies HI    pt denies drugs or etoh use.    Pt alert.

## 2022-05-25 NOTE — ED Notes (Signed)
Blue stripe shirt Blue stripe pants White socks [employer] Bronze color cane

## 2022-05-26 ENCOUNTER — Ambulatory Visit: Payer: PPO | Admitting: "Endocrinology

## 2022-05-26 ENCOUNTER — Emergency Department
Admission: EM | Admit: 2022-05-26 | Discharge: 2022-05-26 | Disposition: A | Discharge status: Home / Self Care | Payer: PPO | Attending: Emergency Medicine | Admitting: Emergency Medicine

## 2022-05-26 DIAGNOSIS — F32A Depression, unspecified: Secondary | ICD-10-CM

## 2022-05-26 DIAGNOSIS — N390 Urinary tract infection, site not specified: Secondary | ICD-10-CM

## 2022-05-26 DIAGNOSIS — F419 Anxiety disorder, unspecified: Secondary | ICD-10-CM

## 2022-05-26 DIAGNOSIS — E039 Hypothyroidism, unspecified: Secondary | ICD-10-CM | POA: Diagnosis present

## 2022-05-26 DIAGNOSIS — E1122 Type 2 diabetes mellitus with diabetic chronic kidney disease: Secondary | ICD-10-CM | POA: Diagnosis present

## 2022-05-26 DIAGNOSIS — I10 Essential (primary) hypertension: Secondary | ICD-10-CM | POA: Diagnosis present

## 2022-05-26 DIAGNOSIS — R451 Restlessness and agitation: Secondary | ICD-10-CM | POA: Diagnosis not present

## 2022-05-26 DIAGNOSIS — N1832 Type 2 diabetes mellitus with diabetic chronic kidney disease: Secondary | ICD-10-CM | POA: Diagnosis present

## 2022-05-26 DIAGNOSIS — R251 Tremor, unspecified: Secondary | ICD-10-CM | POA: Diagnosis present

## 2022-05-26 DIAGNOSIS — I639 Cerebral infarction, unspecified: Secondary | ICD-10-CM | POA: Diagnosis present

## 2022-05-26 LAB — URINE DRUG SCREEN, QUALITATIVE (ARMC ONLY)
Amphetamines, Ur Screen: NOT DETECTED
Barbiturates, Ur Screen: NOT DETECTED
Benzodiazepine, Ur Scrn: NOT DETECTED
Cannabinoid 50 Ng, Ur ~~LOC~~: NOT DETECTED
Cocaine Metabolite,Ur ~~LOC~~: NOT DETECTED
MDMA (Ecstasy)Ur Screen: NOT DETECTED
Methadone Scn, Ur: NOT DETECTED
Opiate, Ur Screen: NOT DETECTED
Phencyclidine (PCP) Ur S: NOT DETECTED
Tricyclic, Ur Screen: NOT DETECTED

## 2022-05-26 LAB — CBG MONITORING, ED: Glucose-Capillary: 89 mg/dL (ref 70–99)

## 2022-05-26 LAB — URINALYSIS, ROUTINE W REFLEX MICROSCOPIC
Bilirubin Urine: NEGATIVE
Glucose, UA: >=500 mg/dL — AB
Hgb urine dipstick: NEGATIVE
Ketones, ur: NEGATIVE mg/dL
Nitrite: NEGATIVE
Protein, ur: 100 mg/dL — AB
Specific Gravity, Urine: 1.015 (ref 1.005–1.030)
pH: 5 (ref 5.0–8.0)

## 2022-05-26 LAB — TROPONIN I (HIGH SENSITIVITY): Troponin I (High Sensitivity): 11 ng/L (ref <18)

## 2022-05-26 MED ORDER — PANTOPRAZOLE SODIUM 40 MG PO TBEC
40.0000 mg | DELAYED_RELEASE_TABLET | Freq: Every day | ORAL | Status: DC
  Administered 2022-05-26: 40 mg via ORAL
  Filled 2022-05-26: qty 1

## 2022-05-26 MED ORDER — GABAPENTIN 100 MG PO CAPS
100.0000 mg | ORAL_CAPSULE | Freq: Two times a day (BID) | ORAL | Status: DC
  Administered 2022-05-26: 100 mg via ORAL
  Filled 2022-05-26: qty 1

## 2022-05-26 MED ORDER — ATORVASTATIN CALCIUM 20 MG PO TABS
40.0000 mg | ORAL_TABLET | Freq: Every day | ORAL | Status: DC
  Administered 2022-05-26: 40 mg via ORAL
  Filled 2022-05-26: qty 2

## 2022-05-26 MED ORDER — TRAMADOL HCL 50 MG PO TABS
50.0000 mg | ORAL_TABLET | Freq: Every day | ORAL | Status: DC
  Administered 2022-05-26: 50 mg via ORAL
  Filled 2022-05-26: qty 1

## 2022-05-26 MED ORDER — HYDRALAZINE HCL 50 MG PO TABS
50.0000 mg | ORAL_TABLET | Freq: Two times a day (BID) | ORAL | Status: DC
  Administered 2022-05-26: 50 mg via ORAL
  Filled 2022-05-26: qty 1

## 2022-05-26 MED ORDER — LISINOPRIL 10 MG PO TABS
10.0000 mg | ORAL_TABLET | Freq: Every day | ORAL | Status: DC
  Administered 2022-05-26: 10 mg via ORAL
  Filled 2022-05-26: qty 1

## 2022-05-26 MED ORDER — EMPAGLIFLOZIN 25 MG PO TABS
25.0000 mg | ORAL_TABLET | Freq: Every day | ORAL | Status: DC
  Administered 2022-05-26: 25 mg via ORAL
  Filled 2022-05-26: qty 1

## 2022-05-26 MED ORDER — DILTIAZEM HCL ER COATED BEADS 180 MG PO CP24
180.0000 mg | ORAL_CAPSULE | Freq: Every day | ORAL | Status: DC
  Administered 2022-05-26: 180 mg via ORAL
  Filled 2022-05-26: qty 1

## 2022-05-26 MED ORDER — CEFDINIR 300 MG PO CAPS: 300.0000 mg | ORAL_CAPSULE | Freq: Two times a day (BID) | ORAL | 0 refills | Status: AC

## 2022-05-26 MED ORDER — METOPROLOL TARTRATE 25 MG PO TABS
25.0000 mg | ORAL_TABLET | Freq: Two times a day (BID) | ORAL | Status: DC
  Administered 2022-05-26: 25 mg via ORAL
  Filled 2022-05-26: qty 1

## 2022-05-26 MED ORDER — ASPIRIN 81 MG PO CHEW
81.0000 mg | CHEWABLE_TABLET | Freq: Every day | ORAL | Status: DC
  Administered 2022-05-26: 81 mg via ORAL
  Filled 2022-05-26: qty 1

## 2022-05-26 MED ORDER — GLIPIZIDE ER 10 MG PO TB24
10.0000 mg | ORAL_TABLET | Freq: Two times a day (BID) | ORAL | Status: DC
  Administered 2022-05-26: 10 mg via ORAL
  Filled 2022-05-26: qty 1

## 2022-05-26 MED ORDER — LEVOTHYROXINE SODIUM 50 MCG PO TABS
50.0000 ug | ORAL_TABLET | Freq: Every day | ORAL | Status: DC
  Administered 2022-05-26: 50 ug via ORAL
  Filled 2022-05-26: qty 1

## 2022-05-26 MED ORDER — METOPROLOL SUCCINATE ER 50 MG PO TB24
50.0000 mg | ORAL_TABLET | Freq: Every day | ORAL | Status: DC
  Administered 2022-05-26: 50 mg via ORAL
  Filled 2022-05-26: qty 1

## 2022-05-26 MED ORDER — ACETAMINOPHEN 325 MG PO TABS
650.0000 mg | ORAL_TABLET | Freq: Once | ORAL | Status: AC
  Administered 2022-05-26: 650 mg via ORAL
  Filled 2022-05-26: qty 2

## 2022-05-26 MED ORDER — METOPROLOL TARTRATE 50 MG PO TABS: 50.0000 mg | ORAL_TABLET | Freq: Two times a day (BID) | ORAL | Status: DC

## 2022-05-26 MED ORDER — CLONAZEPAM 0.5 MG PO TABS
0.5000 mg | ORAL_TABLET | Freq: Four times a day (QID) | ORAL | Status: DC | PRN
  Administered 2022-05-26: 0.5 mg via ORAL
  Filled 2022-05-26: qty 1

## 2022-05-26 NOTE — ED Notes (Signed)
Hospital meal provided.  100% consumed, pt tolerated w/o complaints.  Waste discarded appropriately.   

## 2022-05-26 NOTE — Consult Note (Signed)
[facility] Psych ED Progress Note  05/26/2022 12:14 PM [name]  MRN:  [id number]   Method of visit?: Face to Face   Subjective:  "I would not kill myself. I love myself. I was just angry."  On evaluation, patient denies thought or intent of suicide or homicide/harming anyone else.   Denies auditory or visual hallucinations and perceptions appear to be intact. Patient is somewhat agitated as she talks about her daughter trying to be "my boss."  Patient shares frustration with not being able to drive anymore, and changes in her general life from having a stroke in August 2023.  She says "nothing is wrong with me.  I was just mad at my daughter for telling me what to do."  Patient shares that she has been on prednisone for "sinus infection."  Writer spoke with daughter, Joslyn Devon [phone]: Patient lives with her husband.  Daughter does not live there but is available.  Daughter states that patient had been on prednisone and this is when her behaviors started to be aggressive and very irritable.  Before that she had not had these problems.  Prednisone was stopped on Tuesday.  Discussed that this may have something to do with her behaviors.  Discussed with daughter that patient does not meet criteria for inpatient psychiatric admission at this time.  Family should contact her outpatient provider regarding a stroke in some residual behavioral effects.  Head CT done last evening showed no evidence for acute intracranial abnormality.  Patient is showing some leukocytes in urine.  EDP will provide antibiotics.  Attempted to reach husband.  No answer x 2.  Discussed with daughter to follow-up with outpatient provider. Return to ED as necessary. Reiterated gun  safety, locking up firearms and items that patient may be able to cause herself harm. Daughter confirmed that patient does not have access to firearms.        Principal Problem: Anxiety with agitation Diagnosis:  Principal Problem:   Anxiety  with agitation Active Problems:   Essential hypertension, benign   Type 2 diabetes mellitus with stage 3b chronic kidney disease, without long-term current use of insulin (HCC)   Tremor   Ischemic stroke (HCC)   Hypothyroidism   Depression  Total Time spent with patient: 45 minutes  Past Psychiatric History:   Past Medical History:  Past Medical History:  Diagnosis Date   Anxiety    Benign essential tremor    Cardiac murmur    Reportedly for years-mild   Chronic diarrhea    Degenerative joint disease    Diabetes mellitus    Diabetic neuropathy (HCC)    Diverticulosis    Essential hypertension, benign    GERD (gastroesophageal reflux disease)    GI bleed    NSAID use   PONV (postoperative nausea and vomiting)    Seasonal allergies    Shortness of breath    TIA (transient ischemic attack)    1998   Type 2 diabetes mellitus (HCC)    Urinary incontinence     Past Surgical History:  Procedure Laterality Date   ABDOMINAL HYSTERECTOMY     APPENDECTOMY     Arthroscopic left shoulder surgery     BREAST SURGERY     lumpectomy X 2-benign   CARPAL TUNNEL RELEASE Left 10/03/2012   Procedure: CARPAL TUNNEL RELEASE;  Surgeon: Cammie Sickle., MD;  Location: [facility];  Service: Orthopedics;  Laterality: Left;   CHOLECYSTECTOMY     COLON SURGERY  COLONOSCOPY  03/03/09   COLONOSCOPY  09/16/2004   ROURK   Colostomy with reversal     ESOPHAGOGASTRODUODENOSCOPY     EGD 06/08/2008   ESOPHAGOGASTRODUODENOSCOPY  09/16/04   DIAG EGD W/TCS   INCISIONAL HERNIA REPAIR N/A 12/25/2021   Procedure: HERNIA REPAIR INCISIONAL;  Surgeon: Benjamine Sprague, DO;  Location: [facility];  Service: General;  Laterality: N/A;   REPLACEMENT TOTAL KNEE BILATERAL  2001, 2005   Right elbow surgery     TENDON RECONSTRUCTION Left 10/03/2012   Procedure: LEFT MEDIAL ELBOW RECONSTRUCTION;  Surgeon: Cammie Sickle., MD;  Location: [facility];  Service: Orthopedics;  Laterality:  Left;   TONSILLECTOMY     Family History:  Family History  Problem Relation Age of Onset   Cancer Father        Leukemia   Stroke Paternal Grandfather    Colon cancer Neg Hx    Family Psychiatric  History:  Social History:  Social History   Substance and Sexual Activity  Alcohol Use No   Alcohol/week: 0.0 standard drinks of alcohol     Social History   Substance and Sexual Activity  Drug Use No    Social History   Socioeconomic History   Marital status: Married    Spouse name: Not on file   Number of children: Not on file   Years of education: Not on file   Highest education level: GED or equivalent  Occupational History   Occupation: Disabled  Tobacco Use   Smoking status: Former    Packs/day: 2.00    Years: 40.00    Total pack years: 80.00    Types: Cigarettes    Quit date: 11/02/1997    Years since quitting: 24.5   Smokeless tobacco: Never   Tobacco comments:    quit June [id number]  Vaping Use   Vaping Use: Never used  Substance and Sexual Activity   Alcohol use: No    Alcohol/week: 0.0 standard drinks of alcohol   Drug use: No   Sexual activity: Not on file  Other Topics Concern   Not on file  Social History Narrative   Lives with husband   Right handed   Caffeine: none    Social Determinants of Health   Financial Resource Strain: Not on file  Food Insecurity: Not on file  Transportation Needs: Not on file  Physical Activity: Not on file  Stress: Not on file  Social Connections: Not on file    Sleep: Fair  Appetite:  Fair  Current Medications: Current Facility-Administered Medications  Medication Dose Route Frequency Provider Last Rate Last Admin   aspirin chewable tablet 81 mg  81 mg Oral Daily GNance Pear MD   81 mg at 05/26/22 1009   atorvastatin (LIPITOR) tablet 40 mg  40 mg Oral Daily GNance Pear MD   40 mg at 05/26/22 1010   clonazePAM (KLONOPIN) tablet 0.5 mg  0.5 mg Oral QID PRN BWaldon MerlF, NP   0.5 mg at  05/26/22 0935   diltiazem (CARDIZEM CD) 24 hr capsule 180 mg  180 mg Oral Daily GNance Pear MD   180 mg at 05/26/22 1156   empagliflozin (JARDIANCE) tablet 25 mg  25 mg Oral Daily GNance Pear MD   25 mg at 05/26/22 1157   gabapentin (NEURONTIN) capsule 100 mg  100 mg Oral BID GNance Pear MD   100 mg at 05/26/22 1010   glipiZIDE (GLUCOTROL XL) 24 hr tablet 10 mg  10  mg Oral BID Nance Pear, MD   10 mg at 05/26/22 1157   hydrALAZINE (APRESOLINE) tablet 50 mg  50 mg Oral BID Nance Pear, MD   50 mg at 05/26/22 1010   levothyroxine (SYNTHROID) tablet 50 mcg  50 mcg Oral Daily Nance Pear, MD   50 mcg at 05/26/22 1010   lisinopril (ZESTRIL) tablet 10 mg  10 mg Oral Daily Nance Pear, MD   10 mg at 05/26/22 1055   metoprolol tartrate (LOPRESSOR) tablet 50 mg  50 mg Oral BID Nance Pear, MD       pantoprazole (PROTONIX) EC tablet 40 mg  40 mg Oral Daily Nance Pear, MD   40 mg at 05/26/22 1057   traMADol (ULTRAM) tablet 50 mg  50 mg Oral Daily Nance Pear, MD   50 mg at 05/26/22 1059   Current Outpatient Medications  Medication Sig Dispense Refill   aspirin 81 MG chewable tablet Chew 1 tablet (81 mg total) by mouth daily.     atorvastatin (LIPITOR) 40 MG tablet Take 1 tablet (40 mg total) by mouth daily. 30 tablet 1   cefdinir (OMNICEF) 300 MG capsule Take 1 capsule (300 mg total) by mouth 2 (two) times daily for 5 days. 10 capsule 0   clonazePAM (KLONOPIN) 0.5 MG tablet Take 0.5 mg by mouth 4 (four) times daily as needed.     diltiazem (CARDIZEM CD) 180 MG 24 hr capsule Take 180 mg by mouth daily.     empagliflozin (JARDIANCE) 25 MG TABS tablet Take by mouth daily.     ergocalciferol (VITAMIN D2) 50000 UNITS capsule Take 50,000 Units by mouth once a week.     gabapentin (NEURONTIN) 100 MG capsule Take 100 mg by mouth 2 (two) times daily.     glipiZIDE (GLUCOTROL XL) 10 MG 24 hr tablet Take 10 mg by mouth 2 (two) times daily.     hydrALAZINE  (APRESOLINE) 50 MG tablet Take 1 tablet (50 mg total) by mouth every 8 (eight) hours. (Patient taking differently: Take 50 mg by mouth in the morning and at bedtime.) 90 tablet 1   insulin degludec (TRESIBA FLEXTOUCH) 200 UNIT/ML FlexTouch Pen Inject 20 Units into the skin at bedtime. (Patient taking differently: Inject 15 Units into the skin at bedtime.) 6 mL 2   Insulin Pen Needle (BD PEN NEEDLE NANO U/F) 32G X 4 MM MISC 1 each by Does not apply route 4 (four) times daily. 100 each 2   levothyroxine (SYNTHROID, LEVOTHROID) 50 MCG tablet Take 50 mcg by mouth daily.     lisinopril (ZESTRIL) 10 MG tablet Take 10 mg by mouth daily.     metoprolol tartrate (LOPRESSOR) 50 MG tablet Take 50 mg by mouth 2 (two) times daily.     pantoprazole (PROTONIX) 40 MG tablet TAKE 1 TABLET BY MOUTH EVERY DAY IN THE MORNING (Patient taking differently: Take 40 mg by mouth daily.) 90 tablet 3   traMADol (ULTRAM) 50 MG tablet Take 50 mg by mouth daily.     clopidogrel (PLAVIX) 75 MG tablet Take 1 tablet (75 mg total) by mouth daily. (Patient not taking: Reported on 05/05/2022) 30 tablet 2    Lab Results:  Results for orders placed or performed during the hospital encounter of 05/26/22 (from the past 48 hour(s))  Urine Drug Screen, Qualitative ([place] only)     Status: None   Collection Time: 05/25/22  6:45 PM  Result Value Ref Range   Tricyclic, Ur Screen NONE DETECTED NONE DETECTED  Amphetamines, Ur Screen NONE DETECTED NONE DETECTED   MDMA (Ecstasy)Ur Screen NONE DETECTED NONE DETECTED   Cocaine Metabolite,Ur [place] NONE DETECTED NONE DETECTED   Opiate, Ur Screen NONE DETECTED NONE DETECTED   Phencyclidine (PCP) Ur S NONE DETECTED NONE DETECTED   Cannabinoid 50 Ng, Ur [place] NONE DETECTED NONE DETECTED   Barbiturates, Ur Screen NONE DETECTED NONE DETECTED   Benzodiazepine, Ur Scrn NONE DETECTED NONE DETECTED   Methadone Scn, Ur NONE DETECTED NONE DETECTED    Comment: (NOTE) Tricyclics + metabolites, urine    Cutoff  1000 ng/mL Amphetamines + metabolites, urine  Cutoff 1000 ng/mL MDMA (Ecstasy), urine              Cutoff 500 ng/mL Cocaine Metabolite, urine          Cutoff 300 ng/mL Opiate + metabolites, urine        Cutoff 300 ng/mL Phencyclidine (PCP), urine         Cutoff 25 ng/mL Cannabinoid, urine                 Cutoff 50 ng/mL Barbiturates + metabolites, urine  Cutoff 200 ng/mL Benzodiazepine, urine              Cutoff 200 ng/mL Methadone, urine                   Cutoff 300 ng/mL  The urine drug screen provides only a preliminary, unconfirmed analytical test result and should not be used for non-medical purposes. Clinical consideration and professional judgment should be applied to any positive drug screen result due to possible interfering substances. A more specific alternate chemical method must be used in order to obtain a confirmed analytical result. Gas chromatography / mass spectrometry (GC/MS) is the preferred confirm atory method. Performed at [facility], [address] [place], [place] [address]   Urinalysis, Routine w reflex microscopic     Status: Abnormal   Collection Time: 05/25/22  6:45 PM  Result Value Ref Range   Color, Urine YELLOW (A) YELLOW   APPearance CLEAR (A) CLEAR   Specific Gravity, Urine 1.015 1.005 - 1.030   pH 5.0 5.0 - 8.0   Glucose, UA >=500 (A) NEGATIVE mg/dL   Hgb urine dipstick NEGATIVE NEGATIVE   Bilirubin Urine NEGATIVE NEGATIVE   Ketones, ur NEGATIVE NEGATIVE mg/dL   Protein, ur 100 (A) NEGATIVE mg/dL   Nitrite NEGATIVE NEGATIVE   Leukocytes,Ua TRACE (A) NEGATIVE   RBC / HPF 0-5 0 - 5 RBC/hpf   WBC, UA 21-50 0 - 5 WBC/hpf   Bacteria, UA RARE (A) NONE SEEN   Squamous Epithelial / LPF 0-5 0 - 5 /HPF   Mucus PRESENT     Comment: Performed at [facility], [address] [place], [place] [address]  Ethanol     Status: None   Collection Time: 05/25/22 11:02 PM  Result Value Ref Range   Alcohol, Ethyl (B) <10 <10 mg/dL     Comment: (NOTE) Lowest detectable limit for serum alcohol is 10 mg/dL.  For medical purposes only. Performed at [facility], [address] [place], [place] [address]   CBC     Status: Abnormal   Collection Time: 05/25/22 11:02 PM  Result Value Ref Range   WBC 18.0 (H) 4.0 - 10.5 K/uL   RBC 5.71 (H) 3.87 - 5.11 MIL/uL   Hemoglobin 16.3 (H) 12.0 - 15.0 g/dL   HCT 52.0 (H) 36.0 - 46.0 %   MCV  91.1 80.0 - 100.0 fL   MCH 28.5 26.0 - 34.0 pg   MCHC 31.3 30.0 - 36.0 g/dL   RDW 18.3 (H) 11.5 - 15.5 %   Platelets 641 (H) 150 - 400 K/uL   nRBC 0.0 0.0 - 0.2 %    Comment: Performed at [facility], [address] [place], [place] [address]  Basic metabolic panel     Status: Abnormal   Collection Time: 05/25/22 11:02 PM  Result Value Ref Range   Sodium 136 135 - 145 mmol/L   Potassium 4.1 3.5 - 5.1 mmol/L   Chloride 106 98 - 111 mmol/L   CO2 18 (L) 22 - 32 mmol/L   Glucose, Bld 120 (H) 70 - 99 mg/dL    Comment: Glucose reference range applies only to samples taken after fasting for at least 8 hours.   BUN 48 (H) 8 - 23 mg/dL   Creatinine, Ser 1.46 (H) 0.44 - 1.00 mg/dL   Calcium 9.3 8.9 - 10.3 mg/dL   GFR, Estimated 37 (L) >60 mL/min    Comment: (NOTE) Calculated using the CKD-EPI Creatinine Equation (2021)    Anion gap 12 5 - 15    Comment: Performed at [facility], [address] [place], [place] [address]  Acetaminophen level     Status: Abnormal   Collection Time: 05/25/22 11:02 PM  Result Value Ref Range   Acetaminophen (Tylenol), Serum <10 (L) 10 - 30 ug/mL    Comment: (NOTE) Therapeutic concentrations vary significantly. A range of 10-30 ug/mL  may be an effective concentration for many patients. However, some  are best treated at concentrations outside of this range. Acetaminophen concentrations >150 ug/mL at 4 hours after ingestion  and >50 ug/mL at 12 hours after ingestion are often associated with  toxic reactions.  Performed at  [facility], [address] [place], [place] [address]   Salicylate level     Status: Abnormal   Collection Time: 05/25/22 11:02 PM  Result Value Ref Range   Salicylate Lvl <7.6 (L) 7.0 - 30.0 mg/dL    Comment: Performed at [facility], [place], [place] [address]  Troponin I (High Sensitivity)     Status: None   Collection Time: 05/25/22 11:02 PM  Result Value Ref Range   Troponin I (High Sensitivity) 11 <18 ng/L    Comment: (NOTE) Elevated high sensitivity troponin I (hsTnI) values and significant  changes across serial measurements may suggest ACS but many other  chronic and acute conditions are known to elevate hsTnI results.  Refer to the "Links" section for chest pain algorithms and additional  guidance. Performed at [facility], [address] [place], [place] [address]   CBG monitoring, ED     Status: None   Collection Time: 05/26/22  1:19 AM  Result Value Ref Range   Glucose-Capillary 89 70 - 99 mg/dL    Comment: Glucose reference range applies only to samples taken after fasting for at least 8 hours.    Blood Alcohol level:  Lab Results  Component Value Date   ETH <10 05/25/2022    Physical Findings: AIMS:  , ,  ,  ,    CIWA:    COWS:     Musculoskeletal: Strength & Muscle Tone:  Did not assess Gait & Station:  did not assess Patient leans: N/A  Psychiatric Specialty Exam:  Presentation  General Appearance:  Appropriate for Environment  Eye Contact: Good  Speech: Clear  and Coherent  Speech Volume: Normal  Handedness: Right   Mood and Affect  Mood: Anxious  Affect: Congruent   Thought Process  Thought Processes: Coherent  Descriptions of Associations:Intact  Orientation:Full (Time, Place and Person)  Thought Content:WDL  History of Schizophrenia/Schizoaffective disorder:No  Duration of Psychotic Symptoms:No data recorded Hallucinations:Hallucinations: None  Ideas of  Reference:None  Suicidal Thoughts:Suicidal Thoughts: No  Homicidal Thoughts:Homicidal Thoughts: No   Sensorium  Memory: Immediate Good; Recent Good  Judgment: Fair  Insight: Fair   Community education officer  Concentration: Good  Attention Span: Good  Recall: Good  Fund of Knowledge: Fair  Language: Fair   Psychomotor Activity  Psychomotor Activity: Psychomotor Activity: Normal   Assets  Assets: Housing; Catering manager; Desire for Improvement; Resilience; Social Support   Sleep  Sleep: Sleep: Fair Number of Hours of Sleep: 6    Physical Exam: Physical Exam Vitals and nursing note reviewed.  HENT:     Head: Normocephalic.     Nose: No congestion or rhinorrhea.  Eyes:     General:        Right eye: No discharge.        Left eye: No discharge.  Cardiovascular:     Rate and Rhythm: Normal rate.  Pulmonary:     Effort: Pulmonary effort is normal.  Musculoskeletal:     Cervical back: Normal range of motion.  Skin:    General: Skin is dry.  Neurological:     Mental Status: She is alert and oriented to person, place, and time.  Psychiatric:        Attention and Perception: Attention normal.        Mood and Affect: Mood is anxious.        Speech: Speech normal.        Behavior: Behavior normal. Behavior is cooperative.        Thought Content: Thought content is not paranoid or delusional. Thought content does not include homicidal or suicidal ideation.        Cognition and Memory: Cognition normal.        Judgment: Judgment normal.    Review of Systems  HENT: Negative.    Respiratory: Negative.    Musculoskeletal: Negative.   Skin: Negative.   Psychiatric/Behavioral:  The patient is nervous/anxious.    Blood pressure (!) 132/94, pulse 64, temperature 97.8 F (36.6 C), temperature source Oral, resp. rate 20, height '5\' 2"'$  (1.575 m), weight 74 kg, SpO2 96 %. Body mass index is 29.84 kg/m.  Treatment Plan Summary: Plan Patient  does not meet criteria for inpatient psychiatric hospitalization. Follow up with outpatient provider and consider seeing psychotherapist. Reviewed  with EDP  Sherlon Handing, NP 05/26/2022, 12:14 PM

## 2022-05-26 NOTE — ED Notes (Signed)
Pt brought in by [place] sheriff.  Pt is IVC.  Pt states her daughter wanted her out of the house and had this done to her.  Pt reports a headache, covers eyes with hands.  Denies drugs or etoh use.  Pt in hallway bed.

## 2022-05-26 NOTE — Consult Note (Signed)
[facility] Consult   Reason for Consult: Psychiatric Evaluation Referring Physician: Dr. 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 I can't believe she got me here".  [name] is a 77 y.o. female patient presented to [facility] ED via law enforcement under involuntary commitment status (IVC). The patient is highly emotional, holding her head. The patient is complaining of a frontal headache. The patient has her face in her hands. 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 The patient stated she could not lie down because the bed was too flat. The bed is at a 30-degree angle. I discussed with the patient that the [facility] could come up, but she was too upset to want to receive any help. The patient has severe tremors.   This provider saw The patient face-to-face; the chart was reviewed, and Dr. Beather Arbour was consulted on 05/26/2022 due to the patient's care. It was discussed with the EDP that the patient would remain under observation overnight and be reassessed in the morning. On evaluation, the patient is alert and oriented x 4, angry and anxious but cooperative during her assessment and mood-congruent with affect.  The patient does not appear to be responding to internal or external stimuli. Neither is the patient presenting with any delusional thinking. The patient denies auditory or visual hallucinations. The patient denies any suicidal, homicidal, or self-harm ideations. The patient is not presenting with any psychotic or paranoid  behaviors. During an encounter with the patient, she could answer questions appropriately.  Please see TTS collateral note:  HPI: Per Dr. Beather Arbour, [name] is a 77 y.o. female brought to the ED by [facility] department under IVC from home.  States she had a silly argument with her daughter today.  Endorses frontal headache.  Denies active SI/HI/AH/VH.  Voices no other complaints or injuries.   Past Psychiatric History:  Anxiety  Risk to Self:   Risk to Others:   Prior Inpatient Therapy:   Prior Outpatient Therapy:    Past Medical History:  Past Medical History:  Diagnosis Date   Anxiety    Benign essential tremor    Cardiac murmur    Reportedly for years-mild   Chronic diarrhea    Degenerative joint disease    Diabetes mellitus    Diabetic neuropathy ([place])    Diverticulosis    Essential hypertension, benign    GERD (gastroesophageal reflux disease)    GI bleed    NSAID use   PONV (postoperative nausea and vomiting)    Seasonal allergies    Shortness of breath    TIA (transient ischemic attack)    1998   Type 2 diabetes mellitus ([place])    Urinary incontinence     Past Surgical History:  Procedure Laterality Date   ABDOMINAL HYSTERECTOMY     APPENDECTOMY     Arthroscopic left shoulder surgery     BREAST SURGERY     lumpectomy X 2-benign   CARPAL TUNNEL RELEASE Left 10/03/2012   Procedure: CARPAL TUNNEL RELEASE;  Surgeon: Cammie Sickle.,  MD;  Location: [place];  Service: Orthopedics;  Laterality: Left;   CHOLECYSTECTOMY     COLON SURGERY     COLONOSCOPY  03/03/09   COLONOSCOPY  09/16/2004   ROURK   Colostomy with reversal     ESOPHAGOGASTRODUODENOSCOPY     EGD 06/08/2008   ESOPHAGOGASTRODUODENOSCOPY  09/16/04   DIAG EGD W/TCS   INCISIONAL HERNIA REPAIR N/A 12/25/2021   Procedure: HERNIA REPAIR INCISIONAL;  Surgeon: Benjamine Sprague, DO;  Location: [facility];  Service: General;  Laterality: N/A;   REPLACEMENT TOTAL KNEE BILATERAL  2001,  2005   Right elbow surgery     TENDON RECONSTRUCTION Left 10/03/2012   Procedure: LEFT MEDIAL ELBOW RECONSTRUCTION;  Surgeon: Cammie Sickle., MD;  Location: [place];  Service: Orthopedics;  Laterality: Left;   TONSILLECTOMY     Family History:  Family History  Problem Relation Age of Onset   Cancer Father        Leukemia   Stroke Paternal Grandfather    Colon cancer Neg Hx    Family Psychiatric  History: Daughter-Depression Social History:  Social History   Substance and Sexual Activity  Alcohol Use No   Alcohol/week: 0.0 standard drinks of alcohol     Social History   Substance and Sexual Activity  Drug Use No    Social History   Socioeconomic History   Marital status: Married    Spouse name: Not on file   Number of children: Not on file   Years of education: Not on file   Highest education level: GED or equivalent  Occupational History   Occupation: Disabled  Tobacco Use   Smoking status: Former    Packs/day: 2.00    Years: 40.00    Total pack years: 80.00    Types: Cigarettes    Quit date: 11/02/1997    Years since quitting: 24.5   Smokeless tobacco: Never   Tobacco comments:    quit June [id number]  Vaping Use   Vaping Use: Never used  Substance and Sexual Activity   Alcohol use: No    Alcohol/week: 0.0 standard drinks of alcohol   Drug use: No   Sexual activity: Not on file  Other Topics Concern   Not on file  Social History Narrative   Lives with husband   Right handed   Caffeine: none    Social Determinants of Health   Financial Resource Strain: Not on file  Food Insecurity: Not on file  Transportation Needs: Not on file  Physical Activity: Not on file  Stress: Not on file  Social Connections: Not on file   Additional Social History:    Allergies:   Allergies  Allergen Reactions   Codeine Other (See Comments)    "Increases heart rate"    Morphine And Related Other (See Comments)    Hallucinations    Nsaids  Other (See Comments)    Hx  GI bleed   Valium [Diazepam] Other (See Comments)    Patient states that this medication made her want to commit sucide    Labs:  Results for orders placed or performed during the hospital encounter of 05/26/22 (from the past 48 hour(s))  Ethanol     Status: None   Collection Time: 05/25/22 11:02 PM  Result Value Ref Range   Alcohol, Ethyl (B) <10 <10 mg/dL    Comment: (NOTE) Lowest detectable limit for serum alcohol is 10 mg/dL.  For medical purposes only. Performed at [facility], [address] [place], [place] [address]   CBC     Status: Abnormal   Collection Time: 05/25/22 11:02 PM  Result Value Ref Range   WBC 18.0 (H) 4.0 - 10.5 K/uL   RBC 5.71 (H) 3.87 - 5.11 MIL/uL   Hemoglobin 16.3 (H) 12.0 - 15.0 g/dL   HCT 52.0 (H) 36.0 - 46.0 %   MCV 91.1 80.0 - 100.0 fL   MCH 28.5 26.0 - 34.0 pg   MCHC 31.3 30.0 - 36.0 g/dL   RDW 18.3 (H) 11.5 - 15.5 %   Platelets 641 (H) 150 - 400 K/uL   nRBC 0.0 0.0 - 0.2 %    Comment: Performed at [facility], [address] [place], [place] [address]  Basic metabolic panel     Status: Abnormal   Collection Time: 05/25/22 11:02 PM  Result Value Ref Range   Sodium 136 135 - 145 mmol/L   Potassium 4.1 3.5 - 5.1 mmol/L   Chloride 106 98 - 111 mmol/L   CO2 18 (L) 22 - 32 mmol/L   Glucose, Bld 120 (H) 70 - 99 mg/dL    Comment: Glucose reference range applies only to samples taken after fasting for at least 8 hours.   BUN 48 (H) 8 - 23 mg/dL   Creatinine, Ser 1.46 (H) 0.44 - 1.00 mg/dL   Calcium 9.3 8.9 - 10.3 mg/dL   GFR, Estimated 37 (L) >60 mL/min    Comment: (NOTE) Calculated using the CKD-EPI Creatinine Equation (2021)    Anion gap 12 5 - 15    Comment: Performed at [facility], [address] [place], [place] [address]  Acetaminophen level     Status: Abnormal   Collection Time: 05/25/22 11:02 PM  Result Value Ref Range   Acetaminophen (Tylenol), Serum <10 (L) 10 -  30 ug/mL    Comment: (NOTE) Therapeutic concentrations vary significantly. A range of 10-30 ug/mL  may be an effective concentration for many patients. However, some  are best treated at concentrations outside of this range. Acetaminophen concentrations >150 ug/mL at 4 hours after ingestion  and >50 ug/mL at 12 hours after ingestion are often associated with  toxic reactions.  Performed at [facility], [address] [place], [place] [address]   Salicylate level     Status: Abnormal   Collection Time: 05/25/22 11:02 PM  Result Value Ref Range   Salicylate Lvl <2.9 (L) 7.0 - 30.0 mg/dL    Comment: Performed at [facility], [place], [place] [address]  Troponin I (High Sensitivity)     Status: None   Collection Time: 05/25/22 11:02 PM  Result Value Ref Range   Troponin I (High Sensitivity) 11 <18 ng/L    Comment: (NOTE) Elevated high sensitivity troponin I (hsTnI) values and significant  changes across serial measurements may suggest ACS but many other  chronic and acute conditions are known to elevate hsTnI results.  Refer to the "Links" section for chest pain algorithms and additional  guidance. Performed at [facility], [address] [place], [place] [address]   CBG monitoring, ED     Status: None   Collection Time: 05/26/22  1:19 AM  Result Value Ref Range   Glucose-Capillary 89 70 - 99 mg/dL    Comment: Glucose reference range applies only to samples taken after fasting for at least 8 hours.    No current facility-administered medications for this encounter.   Current Outpatient  Medications  Medication Sig Dispense Refill   aspirin 81 MG chewable tablet Chew 1 tablet (81 mg total) by mouth daily.     atorvastatin (LIPITOR) 40 MG tablet Take 1 tablet (40 mg total) by mouth daily. 30 tablet 1   clonazePAM (KLONOPIN) 0.5 MG tablet Take 0.5 mg by mouth 4 (four) times daily as needed.     diltiazem (CARDIZEM CD) 180 MG 24 hr  capsule Take 180 mg by mouth daily.     empagliflozin (JARDIANCE) 25 MG TABS tablet Take by mouth daily.     ergocalciferol (VITAMIN D2) 50000 UNITS capsule Take 50,000 Units by mouth once a week.     gabapentin (NEURONTIN) 100 MG capsule Take 100 mg by mouth 2 (two) times daily.     glipiZIDE (GLUCOTROL XL) 10 MG 24 hr tablet Take 10 mg by mouth 2 (two) times daily.     hydrALAZINE (APRESOLINE) 50 MG tablet Take 1 tablet (50 mg total) by mouth every 8 (eight) hours. (Patient taking differently: Take 50 mg by mouth in the morning and at bedtime.) 90 tablet 1   insulin degludec (TRESIBA FLEXTOUCH) 200 UNIT/ML FlexTouch Pen Inject 20 Units into the skin at bedtime. (Patient taking differently: Inject 15 Units into the skin at bedtime.) 6 mL 2   Insulin Pen Needle (BD PEN NEEDLE NANO U/F) 32G X 4 MM MISC 1 each by Does not apply route 4 (four) times daily. 100 each 2   levothyroxine (SYNTHROID, LEVOTHROID) 50 MCG tablet Take 50 mcg by mouth daily.     lisinopril (ZESTRIL) 10 MG tablet Take 10 mg by mouth daily.     metoprolol succinate (TOPROL-XL) 50 MG 24 hr tablet Take 1 tablet (50 mg total) by mouth daily. Take with or immediately following a meal. 30 tablet 1   metoprolol tartrate (LOPRESSOR) 25 MG tablet Take 25 mg by mouth 2 (two) times daily.     pantoprazole (PROTONIX) 40 MG tablet TAKE 1 TABLET BY MOUTH EVERY DAY IN THE MORNING (Patient taking differently: Take 40 mg by mouth daily.) 90 tablet 3   traMADol (ULTRAM) 50 MG tablet Take 50 mg by mouth daily.     clopidogrel (PLAVIX) 75 MG tablet Take 1 tablet (75 mg total) by mouth daily. (Patient not taking: Reported on 05/05/2022) 30 tablet 2    Musculoskeletal: Strength & Muscle Tone: within normal limits Gait & Station: normal Patient leans: N/A  Psychiatric Specialty Exam:  Presentation  General Appearance:  Appropriate for Environment  Eye Contact: Minimal  Speech: Clear and Coherent  Speech  Volume: Increased  Handedness: Right   Mood and Affect  Mood: Angry; Anxious; Irritable  Affect: Inappropriate; Tearful; Full Range   Thought Process  Thought Processes: Coherent  Descriptions of Associations:Tangential  Orientation:Full (Time, Place and Person)  Thought Content:Illogical; Scattered; Tangential  History of Schizophrenia/Schizoaffective disorder:No data recorded Duration of Psychotic Symptoms:No data recorded Hallucinations:Hallucinations: None  Ideas of Reference:None  Suicidal Thoughts:Suicidal Thoughts: No  Homicidal Thoughts:Homicidal Thoughts: No   Sensorium  Memory: Immediate Good; Recent Good; Remote Good  Judgment: Poor  Insight: Poor   Executive Functions  Concentration: Fair  Attention Span: Fair  Recall: Good  Fund of Knowledge: Good  Language: Good   Psychomotor Activity  Psychomotor Activity: Psychomotor Activity: Normal   Assets  Assets: Communication Skills; Physical Health; Resilience; Social Support; Intimacy   Sleep  Sleep: Sleep: Fair Number of Hours of Sleep: 6   Physical Exam: Physical Exam Vitals and nursing note reviewed.  Constitutional:      Appearance: Normal appearance. She is normal weight.  HENT:     Head: Normocephalic and atraumatic.     Right Ear: External ear normal.     Left Ear: External ear normal.     Nose: Nose normal.     Mouth/Throat:     Mouth: Mucous membranes are moist.  Cardiovascular:     Rate and Rhythm: Normal rate.     Pulses: Normal pulses.  Pulmonary:     Effort: Pulmonary effort is normal.  Musculoskeletal:        General: Normal range of motion.     Cervical back: Normal range of motion and neck supple.  Neurological:     Mental Status: She is alert and oriented to person, place, and time.  Psychiatric:        Attention and Perception: Attention and perception normal.        Mood and Affect: Mood is anxious. Affect is angry, tearful and  inappropriate.        Speech: Speech is rapid and pressured and tangential.        Behavior: Behavior is agitated.        Thought Content: Thought content normal.        Cognition and Memory: Cognition and memory normal.        Judgment: Judgment is impulsive and inappropriate.    Review of Systems  Psychiatric/Behavioral:  The patient is nervous/anxious.    Blood pressure (!) 162/102, pulse 61, temperature 98 F (36.7 C), temperature source Oral, resp. rate 20, height '5\' 2"'$  (1.575 m), weight 74 kg, SpO2 95 %. Body mass index is 29.84 kg/m.  Treatment Plan Summary: Daily contact with patient to assess and evaluate symptoms and progress in treatment, Medication management, and Plan   The patient would remain under observation overnight and be reassessed in the morning due to the patient remain extremely agitated at her daughter.  Disposition: Supportive therapy provided about ongoing stressors.  Caroline Sauger, NP 05/26/2022 5:24 AM

## 2022-05-26 NOTE — ED Notes (Signed)
Pt requested something to eat and drink stating she "has had nothing to eat since 9:00 this morning." Tech checked pts blood sugar first, provided the pt with a fresh hospital meals tray and a fresh cup of ice water.

## 2022-05-26 NOTE — ED Notes (Signed)
[place] [facility] returned call and stated that they can come pickup patient around [facility] for discharge. Pt informed. Discharge instructions reviewed with patient, educated on ABX prescription.
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
## 2022-05-26 NOTE — ED Notes (Signed)
IVC PAPERS  RESCINDED  PER  LOUISE  NP  INFORMED  RN  ALLY

## 2022-05-26 NOTE — ED Notes (Signed)
Pt declines allowing daughter to come pick her up at discharge. Pt states that [employer], Juliann Pulse or Merry Proud stated that they would take her back home on discharge. RN contacted non emergency number for [place] and requested patient ride home. Pt informed. Awaiting return phone call from [facility]

## 2022-05-26 NOTE — ED Notes (Signed)
Psych NP at bedside

## 2022-05-26 NOTE — ED Notes (Signed)
Ambulated to bathroom to provide urine sample by ED tech. Used cane

## 2022-05-26 NOTE — ED Notes (Signed)
EDP notified of elevated blood pressure. Requested home meds be re-ordered as patient is currently still waiting for disposition decision.

## 2022-05-26 NOTE — ED Provider Notes (Signed)
[facility] Provider Note    None    (approximate)   History   Psychiatric Evaluation   HPI  [name] is a 77 y.o. female brought to the ED by [facility] department under IVC from home.  States she had a silly argument with her daughter today.  Endorses frontal headache.  Denies active SI/HI/AH/VH.  Voices no other complaints or injuries.     Past Medical History   Past Medical History:  Diagnosis Date   Anxiety    Benign essential tremor    Cardiac murmur    Reportedly for years-mild   Chronic diarrhea    Degenerative joint disease    Diabetes mellitus    Diabetic neuropathy (HCC)    Diverticulosis    Essential hypertension, benign    GERD (gastroesophageal reflux disease)    GI bleed    NSAID use   PONV (postoperative nausea and vomiting)    Seasonal allergies    Shortness of breath    TIA (transient ischemic attack)    1998   Type 2 diabetes mellitus ([place])    Urinary incontinence      Active Problem List   Patient Active Problem List   Diagnosis Date Noted   Class 1 obesity due to excess calories with serious comorbidity and body mass index (BMI) of 30.0 to 30.9 in adult 05/06/2022   Mixed hyperlipidemia 02/03/2022   Hypothyroidism 02/03/2022   Essential tremor 12/30/2021   Ischemic stroke ([place]) 12/29/2021   AKI (acute kidney injury) ([place]) 12/25/2021   Abdominal pain 12/25/2021   Incarcerated hernia 12/25/2021   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   Diarrhea 10/16/2013   IBS (irritable bowel syndrome) 08/08/2012   GERD (gastroesophageal reflux disease) 08/08/2012   Tremor 08/05/2011   Dysphagia 08/05/2011   Shortness of breath 09/16/2010   Undiagnosed cardiac murmurs 09/16/2010   Essential hypertension, benign 09/16/2010   Type 2 diabetes mellitus with stage 3b chronic kidney disease, without long-term current use of insulin ([place]) 09/16/2010     Past Surgical History   Past Surgical History:  Procedure  Laterality Date   ABDOMINAL HYSTERECTOMY     APPENDECTOMY     Arthroscopic left shoulder surgery     BREAST SURGERY     lumpectomy X 2-benign   CARPAL TUNNEL RELEASE Left 10/03/2012   Procedure: CARPAL TUNNEL RELEASE;  Surgeon: Cammie Sickle., MD;  Location: [place];  Service: Orthopedics;  Laterality: Left;   CHOLECYSTECTOMY     COLON SURGERY     COLONOSCOPY  03/03/09   COLONOSCOPY  09/16/2004   ROURK   Colostomy with reversal     ESOPHAGOGASTRODUODENOSCOPY     EGD 06/08/2008   ESOPHAGOGASTRODUODENOSCOPY  09/16/04   DIAG EGD W/TCS   INCISIONAL HERNIA REPAIR N/A 12/25/2021   Procedure: HERNIA REPAIR INCISIONAL;  Surgeon: Benjamine Sprague, DO;  Location: [facility];  Service: General;  Laterality: N/A;   REPLACEMENT TOTAL KNEE BILATERAL  2001, 2005   Right elbow surgery     TENDON RECONSTRUCTION Left 10/03/2012   Procedure: LEFT MEDIAL ELBOW RECONSTRUCTION;  Surgeon: Cammie Sickle., MD;  Location: [place];  Service: Orthopedics;  Laterality: Left;   TONSILLECTOMY       Home Medications   Prior to Admission medications   Medication Sig Start Date End Date Taking? Authorizing Provider  aspirin 81 MG chewable tablet Chew 1 tablet (81 mg total) by mouth daily. 12/31/21   Orson Eva, MD  atorvastatin (LIPITOR) 40 MG tablet Take 1 tablet (40 mg total) by mouth daily. 12/31/21   Orson Eva, MD  clonazePAM (KLONOPIN) 0.5 MG tablet Take 0.5 mg by mouth 4 (four) times daily as needed.    [provider]  clopidogrel (PLAVIX) 75 MG tablet Take 1 tablet (75 mg total) by mouth daily. Patient not taking: Reported on 05/05/2022 12/31/21   Orson Eva, MD  diltiazem (CARDIZEM CD) 180 MG 24 hr capsule Take 180 mg by mouth daily.    [provider]  empagliflozin (JARDIANCE) 25 MG TABS tablet Take by mouth daily.    [provider]  ergocalciferol (VITAMIN D2) 50000 UNITS capsule Take 50,000 Units by mouth once a week.    [provider]  gabapentin (NEURONTIN) 100 MG capsule Take 100 mg by mouth 2 (two) times daily.    [provider]  glipiZIDE (GLUCOTROL XL) 10 MG 24 hr tablet Take 10 mg by mouth daily with breakfast.    [provider]  hydrALAZINE (APRESOLINE) 50 MG tablet Take 1 tablet (50 mg total) by mouth every 8 (eight) hours. 12/30/21   Orson Eva, MD  insulin degludec (TRESIBA FLEXTOUCH) 200 UNIT/ML FlexTouch Pen Inject 20 Units into the skin at bedtime. 05/05/22   Cassandria Anger, MD  Insulin Pen Needle (BD PEN NEEDLE NANO U/F) 32G X 4 MM MISC 1 each by Does not apply route 4 (four) times daily. 05/05/22   Cassandria Anger, MD  levothyroxine (SYNTHROID, LEVOTHROID) 50 MCG tablet Take 50 mcg by mouth daily.    [provider]  lisinopril (ZESTRIL) 10 MG tablet Take 10 mg by mouth daily.    [provider]  metoprolol succinate (TOPROL-XL) 50 MG 24 hr tablet Take 1 tablet (50 mg total) by mouth daily. Take with or immediately following a meal. 12/31/21   Tat, Shanon Brow, MD  pantoprazole (PROTONIX) 40 MG tablet TAKE 1 TABLET BY MOUTH EVERY DAY IN THE MORNING Patient taking differently: Take 40 mg by mouth daily. 08/07/18   Setzer, Rona Ravens, NP  traMADol (ULTRAM) 50 MG tablet Take 50 mg by mouth every 6 (six) hours as needed for moderate pain.    [provider]     Allergies  Codeine, Morphine and related, Nsaids, and Valium [diazepam]   Family History   Family History  Problem Relation Age of Onset   Cancer Father        Leukemia   Stroke Paternal Grandfather    Colon cancer Neg Hx      Physical Exam  Triage Vital Signs: ED Triage Vitals [05/25/22 1839]  Enc Vitals Group     BP (!) 162/102     Pulse Rate 61     Resp 20     Temp 98 F (36.7 C)     Temp Source Oral     SpO2 95 %     Weight 163 lb 2.3 oz (74 kg)     Height '5\' 2"'$  (1.575 m)     Head Circumference      Peak Flow      Pain Score 10     Pain Loc      Pain Edu?      Excl.  in [place]?     Updated Vital Signs: BP (!) 162/102 (BP Location: Left Arm)   Pulse 61   Temp 98 F (36.7 C) (Oral)   Resp 20   Ht '5\' 2"'$  (1.575 m)   Wt 74 kg  SpO2 95%   BMI 29.84 kg/m    General: Awake, no distress.  CV:  RRR.  Good peripheral perfusion.  Resp:  Normal effort.  CTAB. Abd:  Nontender.  No distention.  Other:  Essential tremor noted.  Appropriate affect.  Alert and oriented x 3.  CN II-XII grossly intact. 5/5 motor strength and sensation all extremities. MAEx4.   ED Results / Procedures / Treatments  Labs (all labs ordered are listed, but only abnormal results are displayed) Labs Reviewed  CBC - Abnormal; Notable for the following components:      Result Value   WBC 18.0 (*)    RBC 5.71 (*)    Hemoglobin 16.3 (*)    HCT 52.0 (*)    RDW 18.3 (*)    Platelets 641 (*)    All other components within normal limits  BASIC METABOLIC PANEL - Abnormal; Notable for the following components:   CO2 18 (*)    Glucose, Bld 120 (*)    BUN 48 (*)    Creatinine, Ser 1.46 (*)    GFR, Estimated 37 (*)    All other components within normal limits  ACETAMINOPHEN LEVEL - Abnormal; Notable for the following components:   Acetaminophen (Tylenol), Serum <10 (*)    All other components within normal limits  SALICYLATE LEVEL - Abnormal; Notable for the following components:   Salicylate Lvl <1.6 (*)    All other components within normal limits  ETHANOL  URINE DRUG SCREEN, QUALITATIVE ([facility] ONLY)  URINALYSIS, ROUTINE W REFLEX MICROSCOPIC  CBG MONITORING, ED  TROPONIN I (HIGH SENSITIVITY)     EKG  None   RADIOLOGY I have independently visualized and interpreted patient's CT as well as noted the radiology interpretation:  CT head: No ICH  Official radiology report(s): CT Head Wo Contrast  Result Date: 05/25/2022 CLINICAL DATA:  Headache EXAM: CT HEAD WITHOUT CONTRAST TECHNIQUE: Contiguous axial images were obtained from the base of the skull through the vertex  without intravenous contrast. RADIATION DOSE REDUCTION: This exam was performed according to the departmental dose-optimization program which includes automated exposure control, adjustment of the mA and/or kV according to patient size and/or use of iterative reconstruction technique. COMPARISON:  CT brain 12/28/2021, MRI 12/28/2021 FINDINGS: Brain: No acute territorial infarction, hemorrhage or intracranial mass. Interval encephalomalacia involving the right parietal, temporal and occipital lobes corresponding to history of prior MCA infarct. Atrophy. Moderate white matter hypodensity likely chronic small vessel ischemic change. Vascular: No hyperdense vessels.  Carotid vascular calcification Skull: Normal. Negative for fracture or focal lesion. Sinuses/Orbits: Mild mucosal thickening in the sinuses Other: None IMPRESSION: 1. No CT evidence for acute intracranial abnormality. 2. Interval encephalomalacia involving the right parietal, temporal and occipital lobes corresponding to history of prior MCA infarct. Atrophy and chronic small vessel ischemic changes of the white matter. Electronically Signed   By: Donavan Foil M.D.   On: 05/25/2022 23:53     PROCEDURES:  Critical Care performed: No  Procedures   MEDICATIONS ORDERED IN ED: Medications  acetaminophen (TYLENOL) tablet 650 mg (650 mg Oral Given 05/26/22 0326)     IMPRESSION / MDM / ASSESSMENT AND PLAN / ED COURSE  I reviewed the triage vital signs and the nursing notes.                             77 year old female presenting under IVC.  CT head unremarkable for acute ICH.  Laboratory results stable from  baseline.  Patient's presentation is most consistent with acute, uncomplicated illness.  The patient has been placed in psychiatric observation due to the need to provide a safe environment for the patient while obtaining psychiatric consultation and evaluation, as well as ongoing medical and medication management to treat the patient's  condition.  The patient has been placed under full IVC at this time.   Clinical Course as of 05/26/22 0408  Wed May 26, 2022  0408 Patient seen by overnight psychiatry team who will reassess in the morning. [JS]    Clinical Course User Index [JS] Paulette Blanch, MD     FINAL CLINICAL IMPRESSION(S) / ED DIAGNOSES   Final diagnoses:  Depression, unspecified depression type     Rx / DC Orders   ED Discharge Orders     None        Note:  This document was prepared using Dragon voice recognition software and may include unintentional dictation errors.   Paulette Blanch, MD 05/26/22 [id number]

## 2022-05-26 NOTE — ED Notes (Signed)
Pt left with [place] [facility] dept.  D/c inst to pt.  Pt alert, calm and cooperative.

## 2022-05-31 DIAGNOSIS — R279 Unspecified lack of coordination: Secondary | ICD-10-CM | POA: Diagnosis not present

## 2022-05-31 DIAGNOSIS — I700 Atherosclerosis of aorta: Secondary | ICD-10-CM | POA: Diagnosis not present

## 2022-05-31 DIAGNOSIS — I10 Essential (primary) hypertension: Secondary | ICD-10-CM | POA: Diagnosis not present

## 2022-05-31 DIAGNOSIS — E1165 Type 2 diabetes mellitus with hyperglycemia: Secondary | ICD-10-CM | POA: Diagnosis not present

## 2022-05-31 DIAGNOSIS — Z299 Encounter for prophylactic measures, unspecified: Secondary | ICD-10-CM | POA: Diagnosis not present

## 2022-05-31 DIAGNOSIS — G319 Degenerative disease of nervous system, unspecified: Secondary | ICD-10-CM | POA: Diagnosis not present

## 2022-05-31 DIAGNOSIS — Z8673 Personal history of transient ischemic attack (TIA), and cerebral infarction without residual deficits: Secondary | ICD-10-CM | POA: Diagnosis not present

## 2022-05-31 DIAGNOSIS — R519 Headache, unspecified: Secondary | ICD-10-CM | POA: Diagnosis not present

## 2022-05-31 DIAGNOSIS — J3489 Other specified disorders of nose and nasal sinuses: Secondary | ICD-10-CM | POA: Diagnosis not present

## 2022-06-28 DIAGNOSIS — F339 Major depressive disorder, recurrent, unspecified: Secondary | ICD-10-CM | POA: Diagnosis not present

## 2022-06-28 DIAGNOSIS — E1165 Type 2 diabetes mellitus with hyperglycemia: Secondary | ICD-10-CM | POA: Diagnosis not present

## 2022-06-28 DIAGNOSIS — Z299 Encounter for prophylactic measures, unspecified: Secondary | ICD-10-CM | POA: Diagnosis not present

## 2022-06-28 DIAGNOSIS — I10 Essential (primary) hypertension: Secondary | ICD-10-CM | POA: Diagnosis not present

## 2022-06-28 DIAGNOSIS — J209 Acute bronchitis, unspecified: Secondary | ICD-10-CM | POA: Diagnosis not present

## 2022-07-28 DIAGNOSIS — Z6831 Body mass index (BMI) 31.0-31.9, adult: Secondary | ICD-10-CM | POA: Diagnosis not present

## 2022-07-28 DIAGNOSIS — F339 Major depressive disorder, recurrent, unspecified: Secondary | ICD-10-CM | POA: Diagnosis not present

## 2022-07-28 DIAGNOSIS — F1320 Sedative, hypnotic or anxiolytic dependence, uncomplicated: Secondary | ICD-10-CM | POA: Diagnosis not present

## 2022-07-28 DIAGNOSIS — E1165 Type 2 diabetes mellitus with hyperglycemia: Secondary | ICD-10-CM | POA: Diagnosis not present

## 2022-07-28 DIAGNOSIS — Z111 Encounter for screening for respiratory tuberculosis: Secondary | ICD-10-CM | POA: Diagnosis not present

## 2022-07-28 DIAGNOSIS — Z299 Encounter for prophylactic measures, unspecified: Secondary | ICD-10-CM | POA: Diagnosis not present

## 2022-07-28 DIAGNOSIS — Z7189 Other specified counseling: Secondary | ICD-10-CM | POA: Diagnosis not present

## 2022-07-28 DIAGNOSIS — Z0000 Encounter for general adult medical examination without abnormal findings: Secondary | ICD-10-CM | POA: Diagnosis not present

## 2022-07-28 DIAGNOSIS — Z1339 Encounter for screening examination for other mental health and behavioral disorders: Secondary | ICD-10-CM | POA: Diagnosis not present

## 2022-07-28 DIAGNOSIS — Z87891 Personal history of nicotine dependence: Secondary | ICD-10-CM | POA: Diagnosis not present

## 2022-07-28 DIAGNOSIS — I10 Essential (primary) hypertension: Secondary | ICD-10-CM | POA: Diagnosis not present

## 2022-07-28 DIAGNOSIS — Z1331 Encounter for screening for depression: Secondary | ICD-10-CM | POA: Diagnosis not present

## 2022-09-28 DIAGNOSIS — C44629 Squamous cell carcinoma of skin of left upper limb, including shoulder: Secondary | ICD-10-CM | POA: Diagnosis not present

## 2022-09-28 DIAGNOSIS — R229 Localized swelling, mass and lump, unspecified: Secondary | ICD-10-CM | POA: Diagnosis not present

## 2022-09-28 DIAGNOSIS — L820 Inflamed seborrheic keratosis: Secondary | ICD-10-CM | POA: Diagnosis not present

## 2022-09-28 DIAGNOSIS — C44529 Squamous cell carcinoma of skin of other part of trunk: Secondary | ICD-10-CM | POA: Diagnosis not present

## 2022-09-28 DIAGNOSIS — L821 Other seborrheic keratosis: Secondary | ICD-10-CM | POA: Diagnosis not present

## 2022-09-28 DIAGNOSIS — Z85828 Personal history of other malignant neoplasm of skin: Secondary | ICD-10-CM | POA: Diagnosis not present

## 2022-09-28 DIAGNOSIS — C44722 Squamous cell carcinoma of skin of right lower limb, including hip: Secondary | ICD-10-CM | POA: Diagnosis not present

## 2022-09-28 DIAGNOSIS — Z08 Encounter for follow-up examination after completed treatment for malignant neoplasm: Secondary | ICD-10-CM | POA: Diagnosis not present

## 2022-09-28 DIAGNOSIS — L814 Other melanin hyperpigmentation: Secondary | ICD-10-CM | POA: Diagnosis not present

## 2022-09-28 DIAGNOSIS — L989 Disorder of the skin and subcutaneous tissue, unspecified: Secondary | ICD-10-CM | POA: Diagnosis not present

## 2022-09-28 DIAGNOSIS — D485 Neoplasm of uncertain behavior of skin: Secondary | ICD-10-CM | POA: Diagnosis not present

## 2022-11-10 DIAGNOSIS — C44529 Squamous cell carcinoma of skin of other part of trunk: Secondary | ICD-10-CM | POA: Diagnosis not present

## 2022-11-16 DIAGNOSIS — Z299 Encounter for prophylactic measures, unspecified: Secondary | ICD-10-CM | POA: Diagnosis not present

## 2022-11-16 DIAGNOSIS — E1165 Type 2 diabetes mellitus with hyperglycemia: Secondary | ICD-10-CM | POA: Diagnosis not present

## 2022-11-16 DIAGNOSIS — I10 Essential (primary) hypertension: Secondary | ICD-10-CM | POA: Diagnosis not present

## 2022-11-16 DIAGNOSIS — E7800 Pure hypercholesterolemia, unspecified: Secondary | ICD-10-CM | POA: Diagnosis not present

## 2022-11-16 DIAGNOSIS — Z87891 Personal history of nicotine dependence: Secondary | ICD-10-CM | POA: Diagnosis not present

## 2022-11-16 DIAGNOSIS — Z0000 Encounter for general adult medical examination without abnormal findings: Secondary | ICD-10-CM | POA: Diagnosis not present

## 2022-11-16 DIAGNOSIS — F1320 Sedative, hypnotic or anxiolytic dependence, uncomplicated: Secondary | ICD-10-CM | POA: Diagnosis not present

## 2022-11-16 DIAGNOSIS — R5383 Other fatigue: Secondary | ICD-10-CM | POA: Diagnosis not present

## 2022-11-17 DIAGNOSIS — Z79899 Other long term (current) drug therapy: Secondary | ICD-10-CM | POA: Diagnosis not present

## 2022-11-17 DIAGNOSIS — E7800 Pure hypercholesterolemia, unspecified: Secondary | ICD-10-CM | POA: Diagnosis not present

## 2022-11-17 DIAGNOSIS — R5383 Other fatigue: Secondary | ICD-10-CM | POA: Diagnosis not present

## 2022-11-23 DIAGNOSIS — D72829 Elevated white blood cell count, unspecified: Secondary | ICD-10-CM | POA: Diagnosis not present

## 2022-11-23 DIAGNOSIS — D582 Other hemoglobinopathies: Secondary | ICD-10-CM | POA: Diagnosis not present

## 2022-11-23 DIAGNOSIS — Z299 Encounter for prophylactic measures, unspecified: Secondary | ICD-10-CM | POA: Diagnosis not present

## 2022-11-23 DIAGNOSIS — N1832 Chronic kidney disease, stage 3b: Secondary | ICD-10-CM | POA: Diagnosis not present

## 2022-11-23 DIAGNOSIS — I10 Essential (primary) hypertension: Secondary | ICD-10-CM | POA: Diagnosis not present

## 2022-11-24 DIAGNOSIS — D0462 Carcinoma in situ of skin of left upper limb, including shoulder: Secondary | ICD-10-CM | POA: Diagnosis not present

## 2023-01-03 DIAGNOSIS — I10 Essential (primary) hypertension: Secondary | ICD-10-CM | POA: Diagnosis not present

## 2023-01-03 DIAGNOSIS — Z299 Encounter for prophylactic measures, unspecified: Secondary | ICD-10-CM | POA: Diagnosis not present

## 2023-01-03 DIAGNOSIS — M25511 Pain in right shoulder: Secondary | ICD-10-CM | POA: Diagnosis not present

## 2023-01-03 DIAGNOSIS — M542 Cervicalgia: Secondary | ICD-10-CM | POA: Diagnosis not present

## 2023-01-31 DIAGNOSIS — N1832 Chronic kidney disease, stage 3b: Secondary | ICD-10-CM | POA: Diagnosis not present

## 2023-01-31 DIAGNOSIS — Z299 Encounter for prophylactic measures, unspecified: Secondary | ICD-10-CM | POA: Diagnosis not present

## 2023-01-31 DIAGNOSIS — I700 Atherosclerosis of aorta: Secondary | ICD-10-CM | POA: Diagnosis not present

## 2023-01-31 DIAGNOSIS — F1320 Sedative, hypnotic or anxiolytic dependence, uncomplicated: Secondary | ICD-10-CM | POA: Diagnosis not present

## 2023-01-31 DIAGNOSIS — J069 Acute upper respiratory infection, unspecified: Secondary | ICD-10-CM | POA: Diagnosis not present

## 2023-03-11 DIAGNOSIS — Z2821 Immunization not carried out because of patient refusal: Secondary | ICD-10-CM | POA: Diagnosis not present

## 2023-03-11 DIAGNOSIS — E1159 Type 2 diabetes mellitus with other circulatory complications: Secondary | ICD-10-CM | POA: Diagnosis not present

## 2023-03-11 DIAGNOSIS — M25511 Pain in right shoulder: Secondary | ICD-10-CM | POA: Diagnosis not present

## 2023-03-11 DIAGNOSIS — I10 Essential (primary) hypertension: Secondary | ICD-10-CM | POA: Diagnosis not present

## 2023-03-11 DIAGNOSIS — Z299 Encounter for prophylactic measures, unspecified: Secondary | ICD-10-CM | POA: Diagnosis not present

## 2023-03-11 DIAGNOSIS — I152 Hypertension secondary to endocrine disorders: Secondary | ICD-10-CM | POA: Diagnosis not present

## 2023-03-11 DIAGNOSIS — D582 Other hemoglobinopathies: Secondary | ICD-10-CM | POA: Diagnosis not present

## 2023-04-11 DIAGNOSIS — Z85828 Personal history of other malignant neoplasm of skin: Secondary | ICD-10-CM | POA: Diagnosis not present

## 2023-04-11 DIAGNOSIS — C4492 Squamous cell carcinoma of skin, unspecified: Secondary | ICD-10-CM | POA: Diagnosis not present

## 2023-04-11 DIAGNOSIS — R208 Other disturbances of skin sensation: Secondary | ICD-10-CM | POA: Diagnosis not present

## 2023-04-11 DIAGNOSIS — L538 Other specified erythematous conditions: Secondary | ICD-10-CM | POA: Diagnosis not present

## 2023-04-11 DIAGNOSIS — L570 Actinic keratosis: Secondary | ICD-10-CM | POA: Diagnosis not present

## 2023-04-11 DIAGNOSIS — L820 Inflamed seborrheic keratosis: Secondary | ICD-10-CM | POA: Diagnosis not present

## 2023-04-11 DIAGNOSIS — L2989 Other pruritus: Secondary | ICD-10-CM | POA: Diagnosis not present

## 2023-04-11 DIAGNOSIS — Z789 Other specified health status: Secondary | ICD-10-CM | POA: Diagnosis not present

## 2023-04-11 DIAGNOSIS — D485 Neoplasm of uncertain behavior of skin: Secondary | ICD-10-CM | POA: Diagnosis not present

## 2023-04-11 DIAGNOSIS — D044 Carcinoma in situ of skin of scalp and neck: Secondary | ICD-10-CM | POA: Diagnosis not present

## 2023-04-11 DIAGNOSIS — Z08 Encounter for follow-up examination after completed treatment for malignant neoplasm: Secondary | ICD-10-CM | POA: Diagnosis not present

## 2023-04-27 DIAGNOSIS — E119 Type 2 diabetes mellitus without complications: Secondary | ICD-10-CM | POA: Diagnosis not present

## 2023-04-27 DIAGNOSIS — H53002 Unspecified amblyopia, left eye: Secondary | ICD-10-CM | POA: Diagnosis not present

## 2023-04-27 DIAGNOSIS — H53453 Other localized visual field defect, bilateral: Secondary | ICD-10-CM | POA: Diagnosis not present

## 2023-04-27 DIAGNOSIS — H04123 Dry eye syndrome of bilateral lacrimal glands: Secondary | ICD-10-CM | POA: Diagnosis not present

## 2023-04-27 DIAGNOSIS — H35033 Hypertensive retinopathy, bilateral: Secondary | ICD-10-CM | POA: Diagnosis not present

## 2023-05-03 ENCOUNTER — Other Ambulatory Visit: Payer: Self-pay | Admitting: "Endocrinology

## 2023-05-11 DIAGNOSIS — M25511 Pain in right shoulder: Secondary | ICD-10-CM | POA: Diagnosis not present

## 2023-05-11 DIAGNOSIS — Z299 Encounter for prophylactic measures, unspecified: Secondary | ICD-10-CM | POA: Diagnosis not present

## 2023-05-11 DIAGNOSIS — I10 Essential (primary) hypertension: Secondary | ICD-10-CM | POA: Diagnosis not present

## 2023-06-07 DIAGNOSIS — I700 Atherosclerosis of aorta: Secondary | ICD-10-CM | POA: Diagnosis not present

## 2023-06-07 DIAGNOSIS — J069 Acute upper respiratory infection, unspecified: Secondary | ICD-10-CM | POA: Diagnosis not present

## 2023-06-07 DIAGNOSIS — N1832 Chronic kidney disease, stage 3b: Secondary | ICD-10-CM | POA: Diagnosis not present

## 2023-06-10 DIAGNOSIS — D044 Carcinoma in situ of skin of scalp and neck: Secondary | ICD-10-CM | POA: Diagnosis not present

## 2023-06-29 DIAGNOSIS — Z299 Encounter for prophylactic measures, unspecified: Secondary | ICD-10-CM | POA: Diagnosis not present

## 2023-06-29 DIAGNOSIS — M179 Osteoarthritis of knee, unspecified: Secondary | ICD-10-CM | POA: Diagnosis not present

## 2023-06-29 DIAGNOSIS — I10 Essential (primary) hypertension: Secondary | ICD-10-CM | POA: Diagnosis not present

## 2023-06-29 DIAGNOSIS — F419 Anxiety disorder, unspecified: Secondary | ICD-10-CM | POA: Diagnosis not present

## 2023-06-29 DIAGNOSIS — E1159 Type 2 diabetes mellitus with other circulatory complications: Secondary | ICD-10-CM | POA: Diagnosis not present

## 2023-06-29 DIAGNOSIS — I152 Hypertension secondary to endocrine disorders: Secondary | ICD-10-CM | POA: Diagnosis not present

## 2023-06-29 DIAGNOSIS — N1832 Chronic kidney disease, stage 3b: Secondary | ICD-10-CM | POA: Diagnosis not present

## 2023-08-01 DIAGNOSIS — R059 Cough, unspecified: Secondary | ICD-10-CM | POA: Diagnosis not present

## 2023-08-01 DIAGNOSIS — E1165 Type 2 diabetes mellitus with hyperglycemia: Secondary | ICD-10-CM | POA: Diagnosis not present

## 2023-08-01 DIAGNOSIS — D582 Other hemoglobinopathies: Secondary | ICD-10-CM | POA: Diagnosis not present

## 2023-08-01 DIAGNOSIS — J069 Acute upper respiratory infection, unspecified: Secondary | ICD-10-CM | POA: Diagnosis not present

## 2023-08-01 DIAGNOSIS — Z299 Encounter for prophylactic measures, unspecified: Secondary | ICD-10-CM | POA: Diagnosis not present

## 2023-09-01 DIAGNOSIS — Z0000 Encounter for general adult medical examination without abnormal findings: Secondary | ICD-10-CM | POA: Diagnosis not present

## 2023-09-01 DIAGNOSIS — F419 Anxiety disorder, unspecified: Secondary | ICD-10-CM | POA: Diagnosis not present

## 2023-09-01 DIAGNOSIS — R5383 Other fatigue: Secondary | ICD-10-CM | POA: Diagnosis not present

## 2023-09-01 DIAGNOSIS — I10 Essential (primary) hypertension: Secondary | ICD-10-CM | POA: Diagnosis not present

## 2023-09-01 DIAGNOSIS — Z7189 Other specified counseling: Secondary | ICD-10-CM | POA: Diagnosis not present

## 2023-09-01 DIAGNOSIS — E7800 Pure hypercholesterolemia, unspecified: Secondary | ICD-10-CM | POA: Diagnosis not present

## 2023-09-01 DIAGNOSIS — Z1331 Encounter for screening for depression: Secondary | ICD-10-CM | POA: Diagnosis not present

## 2023-09-01 DIAGNOSIS — Z79899 Other long term (current) drug therapy: Secondary | ICD-10-CM | POA: Diagnosis not present

## 2023-09-01 DIAGNOSIS — Z6830 Body mass index (BMI) 30.0-30.9, adult: Secondary | ICD-10-CM | POA: Diagnosis not present

## 2023-09-01 DIAGNOSIS — Z1339 Encounter for screening examination for other mental health and behavioral disorders: Secondary | ICD-10-CM | POA: Diagnosis not present

## 2023-09-01 DIAGNOSIS — Z299 Encounter for prophylactic measures, unspecified: Secondary | ICD-10-CM | POA: Diagnosis not present

## 2023-09-16 DIAGNOSIS — I10 Essential (primary) hypertension: Secondary | ICD-10-CM | POA: Diagnosis not present

## 2023-09-16 DIAGNOSIS — M25511 Pain in right shoulder: Secondary | ICD-10-CM | POA: Diagnosis not present

## 2023-09-16 DIAGNOSIS — Z299 Encounter for prophylactic measures, unspecified: Secondary | ICD-10-CM | POA: Diagnosis not present

## 2023-10-11 DIAGNOSIS — R0781 Pleurodynia: Secondary | ICD-10-CM | POA: Diagnosis not present

## 2023-10-11 DIAGNOSIS — Z299 Encounter for prophylactic measures, unspecified: Secondary | ICD-10-CM | POA: Diagnosis not present

## 2023-10-11 DIAGNOSIS — S2231XA Fracture of one rib, right side, initial encounter for closed fracture: Secondary | ICD-10-CM | POA: Diagnosis not present

## 2023-10-11 DIAGNOSIS — N1832 Chronic kidney disease, stage 3b: Secondary | ICD-10-CM | POA: Diagnosis not present

## 2023-10-11 DIAGNOSIS — M19011 Primary osteoarthritis, right shoulder: Secondary | ICD-10-CM | POA: Diagnosis not present

## 2023-10-11 DIAGNOSIS — M25511 Pain in right shoulder: Secondary | ICD-10-CM | POA: Diagnosis not present

## 2023-10-11 DIAGNOSIS — R52 Pain, unspecified: Secondary | ICD-10-CM | POA: Diagnosis not present

## 2023-10-11 DIAGNOSIS — I10 Essential (primary) hypertension: Secondary | ICD-10-CM | POA: Diagnosis not present

## 2023-10-11 DIAGNOSIS — D692 Other nonthrombocytopenic purpura: Secondary | ICD-10-CM | POA: Diagnosis not present

## 2023-10-19 DIAGNOSIS — C44629 Squamous cell carcinoma of skin of left upper limb, including shoulder: Secondary | ICD-10-CM | POA: Diagnosis not present

## 2023-10-19 DIAGNOSIS — C44529 Squamous cell carcinoma of skin of other part of trunk: Secondary | ICD-10-CM | POA: Diagnosis not present

## 2023-10-19 DIAGNOSIS — C44729 Squamous cell carcinoma of skin of left lower limb, including hip: Secondary | ICD-10-CM | POA: Diagnosis not present

## 2023-10-19 DIAGNOSIS — C44722 Squamous cell carcinoma of skin of right lower limb, including hip: Secondary | ICD-10-CM | POA: Diagnosis not present

## 2023-10-19 DIAGNOSIS — Z08 Encounter for follow-up examination after completed treatment for malignant neoplasm: Secondary | ICD-10-CM | POA: Diagnosis not present

## 2023-10-19 DIAGNOSIS — D225 Melanocytic nevi of trunk: Secondary | ICD-10-CM | POA: Diagnosis not present

## 2023-10-19 DIAGNOSIS — L814 Other melanin hyperpigmentation: Secondary | ICD-10-CM | POA: Diagnosis not present

## 2023-10-19 DIAGNOSIS — C4442 Squamous cell carcinoma of skin of scalp and neck: Secondary | ICD-10-CM | POA: Diagnosis not present

## 2023-10-19 DIAGNOSIS — L821 Other seborrheic keratosis: Secondary | ICD-10-CM | POA: Diagnosis not present

## 2023-10-19 DIAGNOSIS — Z85828 Personal history of other malignant neoplasm of skin: Secondary | ICD-10-CM | POA: Diagnosis not present

## 2023-10-19 DIAGNOSIS — L853 Xerosis cutis: Secondary | ICD-10-CM | POA: Diagnosis not present

## 2023-10-26 DIAGNOSIS — I10 Essential (primary) hypertension: Secondary | ICD-10-CM | POA: Diagnosis not present

## 2023-10-26 DIAGNOSIS — M1990 Unspecified osteoarthritis, unspecified site: Secondary | ICD-10-CM | POA: Diagnosis not present

## 2023-10-26 DIAGNOSIS — E1165 Type 2 diabetes mellitus with hyperglycemia: Secondary | ICD-10-CM | POA: Diagnosis not present

## 2023-10-26 DIAGNOSIS — Z299 Encounter for prophylactic measures, unspecified: Secondary | ICD-10-CM | POA: Diagnosis not present

## 2023-11-29 DIAGNOSIS — Z0000 Encounter for general adult medical examination without abnormal findings: Secondary | ICD-10-CM | POA: Diagnosis not present

## 2023-11-29 DIAGNOSIS — R109 Unspecified abdominal pain: Secondary | ICD-10-CM | POA: Diagnosis not present

## 2023-11-29 DIAGNOSIS — E1165 Type 2 diabetes mellitus with hyperglycemia: Secondary | ICD-10-CM | POA: Diagnosis not present

## 2023-11-29 DIAGNOSIS — K529 Noninfective gastroenteritis and colitis, unspecified: Secondary | ICD-10-CM | POA: Diagnosis not present

## 2023-11-29 DIAGNOSIS — Z299 Encounter for prophylactic measures, unspecified: Secondary | ICD-10-CM | POA: Diagnosis not present

## 2023-11-29 DIAGNOSIS — I10 Essential (primary) hypertension: Secondary | ICD-10-CM | POA: Diagnosis not present

## 2023-11-29 DIAGNOSIS — F1320 Sedative, hypnotic or anxiolytic dependence, uncomplicated: Secondary | ICD-10-CM | POA: Diagnosis not present

## 2023-12-06 DIAGNOSIS — I10 Essential (primary) hypertension: Secondary | ICD-10-CM | POA: Diagnosis not present

## 2023-12-06 DIAGNOSIS — E1165 Type 2 diabetes mellitus with hyperglycemia: Secondary | ICD-10-CM | POA: Diagnosis not present

## 2023-12-06 DIAGNOSIS — Z299 Encounter for prophylactic measures, unspecified: Secondary | ICD-10-CM | POA: Diagnosis not present

## 2023-12-06 DIAGNOSIS — R001 Bradycardia, unspecified: Secondary | ICD-10-CM | POA: Diagnosis not present

## 2023-12-15 ENCOUNTER — Ambulatory Visit: Admitting: Internal Medicine

## 2023-12-15 VITALS — BP 140/84 | HR 68 | Temp 98.4°F | Ht 62.0 in | Wt 167.2 lb

## 2023-12-15 DIAGNOSIS — K580 Irritable bowel syndrome with diarrhea: Secondary | ICD-10-CM | POA: Diagnosis not present

## 2023-12-15 DIAGNOSIS — K219 Gastro-esophageal reflux disease without esophagitis: Secondary | ICD-10-CM | POA: Diagnosis not present

## 2023-12-15 DIAGNOSIS — R1030 Lower abdominal pain, unspecified: Secondary | ICD-10-CM

## 2023-12-15 MED ORDER — DICYCLOMINE HCL 10 MG PO CAPS: 10.0000 mg | ORAL_CAPSULE | Freq: Three times a day (TID) | ORAL | 11 refills | Status: AC

## 2023-12-15 NOTE — Progress Notes (Signed)
 Primary Care Physician:  Rosamond Leta NOVAK, MD Primary Gastroenterologist:  Dr. Cindie  Chief Complaint  Patient presents with   New Patient (Initial Visit)    Pt referred for diarrhea, abd pain and a lot of bloating. Pt also complains of gas. Last BM was yesterday. Pt has good BM's    HPI:   [name] is a 78 y.o. female who presents   Chronic GERD: well controlled on pantoprazole  40 mg daily. Well controlled. No epigastric or chest pain. No dysphagia, odynophagia.   IBS with Diarrhea: Not well controlled. On bad days especially when she is stressed she will have 4-5 loose stools as well as fecal urgency. Wears depends all the time due to incontinence. Denies any melena or hematochezia.   Taking imodium as needed. Helps some with slowing down the diarrhea.   Also reports abdominal pain, intermittent, moderate severity. Primarily lower, does not radiate.  Some improvement with bowel movements.  Last EGD was in January 2010 when she presented with upper GI bleed. She was taking too many leads. She had gastric ulcer at antrum. She also had multiple gastric erosions.   Last colonoscopy was in in October 2010 revealing normal terminal ileum normal appearing mucosa. She had few diverticula at transverse and descending colon. Biopsy from sigmoid colon was negative for microscopic colitis and small rectal polyp was presumably hypoplastic.  Past Medical History:  Diagnosis Date   Anxiety    Benign essential tremor    Cardiac murmur    Reportedly for years-mild   Chronic diarrhea    Degenerative joint disease    Diabetes mellitus    Diabetic neuropathy (HCC)    Diverticulosis    Essential hypertension, benign    GERD (gastroesophageal reflux disease)    GI bleed    NSAID use   PONV (postoperative nausea and vomiting)    Seasonal allergies    Shortness of breath    TIA (transient ischemic attack)    1998   Type 2 diabetes mellitus (HCC)    Urinary incontinence      Past Surgical History:  Procedure Laterality Date   ABDOMINAL HYSTERECTOMY     APPENDECTOMY     Arthroscopic left shoulder surgery     BREAST SURGERY     lumpectomy X 2-benign   CARPAL TUNNEL RELEASE Left 10/03/2012   Procedure: CARPAL TUNNEL RELEASE;  Surgeon: Lamar LULLA Leonor Mickey., MD;  Location: [facility];  Service: Orthopedics;  Laterality: Left;   CHOLECYSTECTOMY     COLON SURGERY     COLONOSCOPY  03/03/09   COLONOSCOPY  09/16/2004   ROURK   Colostomy with reversal     ESOPHAGOGASTRODUODENOSCOPY     EGD 06/08/2008   ESOPHAGOGASTRODUODENOSCOPY  09/16/04   DIAG EGD W/TCS   INCISIONAL HERNIA REPAIR N/A 12/25/2021   Procedure: HERNIA REPAIR INCISIONAL;  Surgeon: Tye Millet, DO;  Location: [facility];  Service: General;  Laterality: N/A;   REPLACEMENT TOTAL KNEE BILATERAL  2001, 2005   Right elbow surgery     TENDON RECONSTRUCTION Left 10/03/2012   Procedure: LEFT MEDIAL ELBOW RECONSTRUCTION;  Surgeon: Lamar LULLA Leonor Mickey., MD;  Location: [facility];  Service: Orthopedics;  Laterality: Left;   TONSILLECTOMY      Current Outpatient Medications  Medication Sig Dispense Refill   aspirin  81 MG chewable tablet Chew 1 tablet (81 mg total) by mouth daily.     atorvastatin  (LIPITOR) 40 MG tablet Take 1 tablet (40 mg  total) by mouth daily. 30 tablet 1   clonazePAM  (KLONOPIN ) 0.5 MG tablet Take 0.5 mg by mouth 4 (four) times daily as needed.     empagliflozin  (JARDIANCE ) 25 MG TABS tablet Take by mouth daily.     ergocalciferol (VITAMIN D2) 50000 UNITS capsule Take 50,000 Units by mouth once a week.     gabapentin  (NEURONTIN ) 100 MG capsule Take 100 mg by mouth 2 (two) times daily.     glipiZIDE  (GLUCOTROL  XL) 10 MG 24 hr tablet Take 10 mg by mouth 2 (two) times daily.     hydrALAZINE  (APRESOLINE ) 50 MG tablet Take 1 tablet (50 mg total) by mouth every 8 (eight) hours. 90 tablet 1   lisinopril  (ZESTRIL ) 10 MG tablet Take 10 mg by mouth daily.     loratadine  (CLARITIN) 10 MG tablet Take 10 mg by mouth daily.     metoprolol  tartrate (LOPRESSOR ) 50 MG tablet Take 50 mg by mouth 2 (two) times daily. (Patient taking differently: Take 50 mg by mouth daily.)     pantoprazole  (PROTONIX ) 40 MG tablet TAKE 1 TABLET BY MOUTH EVERY DAY IN THE MORNING 90 tablet 3   traMADol  (ULTRAM ) 50 MG tablet Take 50 mg by mouth daily.     No current facility-administered medications for this visit.    Allergies as of 12/15/2023 - Review Complete 12/15/2023  Allergen Reaction Noted   Codeine Other (See Comments) 09/16/2010   Morphine and codeine Other (See Comments) 09/16/2010   Nsaids Other (See Comments) 02/08/2011   Valium [diazepam] Other (See Comments) 08/07/2012    Family History  Problem Relation Age of Onset   Cancer Father        Leukemia   Stroke Paternal Grandfather    Colon cancer Neg Hx     Social History   Socioeconomic History   Marital status: Married    Spouse name: Not on file   Number of children: Not on file   Years of education: Not on file   Highest education level: GED or equivalent  Occupational History   Occupation: Disabled  Tobacco Use   Smoking status: Former    Current packs/day: 0.00    Average packs/day: 2.0 packs/day for 40.0 years (80.0 ttl pk-yrs)    Types: Cigarettes    Start date: 11/02/1957    Quit date: 11/02/1997    Years since quitting: 26.1   Smokeless tobacco: Never   Tobacco comments:    quit June 97f 1999  Vaping Use   Vaping status: Never Used  Substance and Sexual Activity   Alcohol  use: No    Alcohol /week: 0.0 standard drinks of alcohol    Drug use: No   Sexual activity: Not on file  Other Topics Concern   Not on file  Social History Narrative   Lives with husband   Right handed   Caffeine : none    Social Drivers of Corporate investment banker Strain: Not on file  Food Insecurity: Not on file  Transportation Needs: Not on file  Physical Activity: Not on file  Stress: Not on file  Social  Connections: Not on file  Intimate Partner Violence: Not on file    Subjective: Review of Systems  Constitutional:  Negative for chills and fever.  HENT:  Negative for congestion and hearing loss.   Eyes:  Negative for blurred vision and double vision.  Respiratory:  Negative for cough and shortness of breath.   Cardiovascular:  Negative for chest pain and palpitations.  Gastrointestinal:  Positive for abdominal pain, diarrhea and heartburn. Negative for blood in stool, constipation, melena and vomiting.  Genitourinary:  Negative for dysuria and urgency.  Musculoskeletal:  Negative for joint pain and myalgias.  Skin:  Negative for itching and rash.  Neurological:  Negative for dizziness and headaches.  Psychiatric/Behavioral:  Negative for depression. The patient is not nervous/anxious.        Objective: BP (!) 140/84   Pulse 68   Temp 98.4 F (36.9 C)   Ht 5' 2 (1.575 m)   Wt 167 lb 3.2 oz (75.8 kg)   BMI 30.58 kg/m  Physical Exam Constitutional:      Appearance: Normal appearance.  HENT:     Head: Normocephalic and atraumatic.  Eyes:     Extraocular Movements: Extraocular movements intact.     Conjunctiva/sclera: Conjunctivae normal.  Cardiovascular:     Rate and Rhythm: Normal rate and regular rhythm.  Pulmonary:     Effort: Pulmonary effort is normal.     Breath sounds: Normal breath sounds.  Abdominal:     General: Bowel sounds are normal.     Palpations: Abdomen is soft.  Musculoskeletal:        General: No swelling. Normal range of motion.     Cervical back: Normal range of motion and neck supple.  Skin:    General: Skin is warm and dry.     Coloration: Skin is not jaundiced.  Neurological:     General: No focal deficit present.     Mental Status: She is alert and oriented to person, place, and time.  Psychiatric:        Mood and Affect: Mood normal.        Behavior: Behavior normal.      Assessment/Plan:  1.  IBS with diarrhea-start Imodium 1/2  tablet daily.  Increase to 1 tablet daily as tolerated.  Will start on dicyclomine  10 mg 3 times daily AC.  Will check fecal calprotectin.  Call with results.  Consider trial of cholestyramine for bile acid induced diarrhea if not improved.  2.  Chronic GERD-well-controlled on pantoprazole  40 mg daily.  Will continue.  Follow-up in 6 to 8 weeks  Thank you Dr. Rosamond for the kind referral.  12/15/2023 11:05 AM   Disclaimer: This note was dictated with voice recognition software. Similar sounding words can inadvertently be transcribed and may not be corrected upon review.

## 2023-12-15 NOTE — Patient Instructions (Signed)
 For your IBS with diarrhea, which she start taking Imodium 1/2 tablet daily.  You can titrate up to 1 tablet daily depending on your response.  I am also going to start you on dicyclomine  10 mg.  You can take this up to 3 times a day before meals.  I am going to order a stool test at [facility].  Drop this off when you are able.  Will call with results  Follow-up in 6 to 8 weeks.  It was very nice meeting you today.  Dr. Cindie

## 2023-12-16 DIAGNOSIS — R1030 Lower abdominal pain, unspecified: Secondary | ICD-10-CM | POA: Diagnosis not present

## 2023-12-16 DIAGNOSIS — K580 Irritable bowel syndrome with diarrhea: Secondary | ICD-10-CM | POA: Diagnosis not present

## 2023-12-20 LAB — CALPROTECTIN, FECAL: Calprotectin, Fecal: 58 ug/g (ref 0–120)

## 2023-12-23 ENCOUNTER — Telehealth: Payer: Self-pay | Admitting: Internal Medicine

## 2023-12-23 NOTE — Telephone Encounter (Signed)
 Fecal calprotectin testing was negative.

## 2023-12-26 NOTE — Telephone Encounter (Signed)
 Phoned and spoke with the pt's daughter Shari and advised her of the pt's results. Understanding was expressed

## 2024-01-12 ENCOUNTER — Encounter: Payer: Self-pay | Admitting: Internal Medicine

## 2024-01-17 DIAGNOSIS — M25511 Pain in right shoulder: Secondary | ICD-10-CM | POA: Diagnosis not present

## 2024-01-17 DIAGNOSIS — E1165 Type 2 diabetes mellitus with hyperglycemia: Secondary | ICD-10-CM | POA: Diagnosis not present

## 2024-01-17 DIAGNOSIS — Z299 Encounter for prophylactic measures, unspecified: Secondary | ICD-10-CM | POA: Diagnosis not present

## 2024-01-17 DIAGNOSIS — M67479 Ganglion, unspecified ankle and foot: Secondary | ICD-10-CM | POA: Diagnosis not present

## 2024-01-17 DIAGNOSIS — R52 Pain, unspecified: Secondary | ICD-10-CM | POA: Diagnosis not present

## 2024-01-17 DIAGNOSIS — I10 Essential (primary) hypertension: Secondary | ICD-10-CM | POA: Diagnosis not present

## 2024-01-19 DIAGNOSIS — I10 Essential (primary) hypertension: Secondary | ICD-10-CM | POA: Diagnosis not present

## 2024-01-19 DIAGNOSIS — R52 Pain, unspecified: Secondary | ICD-10-CM | POA: Diagnosis not present

## 2024-01-19 DIAGNOSIS — Z299 Encounter for prophylactic measures, unspecified: Secondary | ICD-10-CM | POA: Diagnosis not present

## 2024-01-19 DIAGNOSIS — N390 Urinary tract infection, site not specified: Secondary | ICD-10-CM | POA: Diagnosis not present

## 2024-01-19 DIAGNOSIS — R350 Frequency of micturition: Secondary | ICD-10-CM | POA: Diagnosis not present

## 2024-01-19 DIAGNOSIS — E1165 Type 2 diabetes mellitus with hyperglycemia: Secondary | ICD-10-CM | POA: Diagnosis not present

## 2024-01-19 DIAGNOSIS — F1320 Sedative, hypnotic or anxiolytic dependence, uncomplicated: Secondary | ICD-10-CM | POA: Diagnosis not present

## 2024-02-03 DIAGNOSIS — Z299 Encounter for prophylactic measures, unspecified: Secondary | ICD-10-CM | POA: Diagnosis not present

## 2024-02-03 DIAGNOSIS — I10 Essential (primary) hypertension: Secondary | ICD-10-CM | POA: Diagnosis not present

## 2024-02-03 DIAGNOSIS — J069 Acute upper respiratory infection, unspecified: Secondary | ICD-10-CM | POA: Diagnosis not present

## 2024-02-03 DIAGNOSIS — R809 Proteinuria, unspecified: Secondary | ICD-10-CM | POA: Diagnosis not present

## 2024-02-03 DIAGNOSIS — N1832 Chronic kidney disease, stage 3b: Secondary | ICD-10-CM | POA: Diagnosis not present

## 2024-02-03 DIAGNOSIS — R52 Pain, unspecified: Secondary | ICD-10-CM | POA: Diagnosis not present

## 2024-02-03 DIAGNOSIS — E119 Type 2 diabetes mellitus without complications: Secondary | ICD-10-CM | POA: Diagnosis not present

## 2024-02-08 ENCOUNTER — Ambulatory Visit: Admitting: Orthopedic Surgery

## 2024-02-13 DIAGNOSIS — J189 Pneumonia, unspecified organism: Secondary | ICD-10-CM | POA: Insufficient documentation

## 2024-02-13 DIAGNOSIS — J329 Chronic sinusitis, unspecified: Secondary | ICD-10-CM | POA: Insufficient documentation

## 2024-02-13 DIAGNOSIS — J40 Bronchitis, not specified as acute or chronic: Secondary | ICD-10-CM | POA: Insufficient documentation

## 2024-02-15 ENCOUNTER — Encounter: Payer: Self-pay | Admitting: Orthopedic Surgery

## 2024-02-15 ENCOUNTER — Ambulatory Visit: Admitting: Orthopedic Surgery

## 2024-02-15 ENCOUNTER — Other Ambulatory Visit: Payer: Self-pay | Admitting: Orthopedic Surgery

## 2024-02-15 ENCOUNTER — Telehealth: Payer: Self-pay | Admitting: Radiology

## 2024-02-15 ENCOUNTER — Other Ambulatory Visit (INDEPENDENT_AMBULATORY_CARE_PROVIDER_SITE_OTHER): Payer: Self-pay

## 2024-02-15 VITALS — BP 182/72 | HR 65 | Ht 62.0 in | Wt 165.0 lb

## 2024-02-15 DIAGNOSIS — M792 Neuralgia and neuritis, unspecified: Secondary | ICD-10-CM

## 2024-02-15 DIAGNOSIS — M19019 Primary osteoarthritis, unspecified shoulder: Secondary | ICD-10-CM

## 2024-02-15 DIAGNOSIS — M25511 Pain in right shoulder: Secondary | ICD-10-CM

## 2024-02-15 DIAGNOSIS — M4722 Other spondylosis with radiculopathy, cervical region: Secondary | ICD-10-CM | POA: Diagnosis not present

## 2024-02-15 MED ORDER — MELOXICAM 7.5 MG PO TABS: 7.5000 mg | ORAL_TABLET | ORAL | 5 refills | Status: DC

## 2024-02-15 MED ORDER — MELOXICAM 7.5 MG PO TABS: 7.5000 mg | ORAL_TABLET | ORAL | 5 refills | Status: AC

## 2024-02-15 NOTE — Progress Notes (Signed)
  Intake history:  Chief Complaint  Patient presents with   Shoulder Pain    Right-      BP (!) 182/72   Pulse 65   Ht 5' 2 (1.575 m)   Wt 165 lb (74.8 kg)   BMI 30.18 kg/m  Body mass index is 30.18 kg/m.  Pharmacy? ______________________________________  WHAT ARE WE SEEING YOU FOR TODAY?   right shoulder  How Brandace Cargle has this bothered you? (DOI?DOS?WS?)  several month(s) ago  Was there an injury? No  Anticoag.  Yes asa 79  Diabetes Yes  Heart disease No  Hypertension Yes  SMOKING HX No  Kidney disease Yes  Any ALLERGIES ______________________________________________   Treatment:  Have you taken:  Tylenol  Yes  Advil No  Had PT No  Had injection Yes  Other  _________________________

## 2024-02-15 NOTE — Progress Notes (Signed)
   Chief Complaint  Patient presents with   Shoulder Pain    Right-    The pain actually goes from the neck down into the hand and crosses over the shoulder  78 year old female pain in the right shoulder at least since April as previous notes indicate an injection was done at that time.  She had multiple injections in the right shoulder and complains of pain which runs from her neck down into her hand and is associated with loss of shoulder motion and painful range of motion of the shoulder  She has had multiple injections actually in the shoulder.  She has a history of diabetes hypertension kidney disease  Medication only Tylenol   Kidney function estimated GFR is 29 dated 1 year ago   Assessment and plan  DG Cervical Spine 2 or 3 views Result Date: 02/15/2024 C-spine x-rays to evaluate pain in the neck which went to the hand Patient has endplate irregularities at C5 superior and inferior and then C5-6 and then C6-7 with straightening of the cervical spine and compensatory uncovertebral joint mid cervical arthritis Impression Abnormal Sagittal plane alignment of the cervical spine loss of lordosis C5-6 and C6-7 degenerative disc changes Mid cervical uncovertebral joint arthrosis   DG Shoulder Right Result Date: 02/15/2024 Right shoulder imaging for acute right shoulder pain onset about 5 to 6 months ago. The patient has glenohumeral arthritis of the right shoulder with joint space narrowing and inferior osteophyte formation and probably a few subchondral cyst She also appears to have osteoarthritis of the acromioclavicular joint Impression moderate to severe glenohumeral arthritis right shoulder     Encounter Diagnoses  Name Primary?   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 Fairburn and her daughter about taking an NSAID once a week taking a proton pump inhibitor and checking her hemoglobin  Arrange intra-articular injection right shoulder  Recommend gabapentin  or Lyrica for pain relief, patient cannot take more gabapentin  because she is already unsteady  Along with liniment  Physical therapy  Meds ordered this encounter  Medications   meloxicam  (MOBIC ) 7.5 MG tablet    Sig: Take 1 tablet (7.5 mg total) by mouth once a week.    Dispense:  30 tablet    Refill:  5   Return in 6 weeks

## 2024-02-15 NOTE — Patient Instructions (Signed)
 [name] will call you with injection at [facility] appointment   Physical therapy has been ordered for you at [facility]. They should call you to schedule, [phone] is the phone number to call, if you want to call to schedule.

## 2024-02-15 NOTE — Addendum Note (Signed)
 Addended byBETHA JENEAN GREIG LELON on: 02/15/2024 09:57 AM   Modules accepted: Orders

## 2024-02-15 NOTE — Telephone Encounter (Signed)
 DR[facility]ARG[facility]TT[facility] fax over a refill request for   meloxicam  (MOBIC ) 7.5 MG tablet

## 2024-02-15 NOTE — Telephone Encounter (Signed)
 Friday 02/17/24 1015 for Shoulder injection

## 2024-02-15 NOTE — Telephone Encounter (Signed)
 Call to schedule injection right shoulder at [facility]

## 2024-02-17 ENCOUNTER — Ambulatory Visit (HOSPITAL_COMMUNITY)
Admission: RE | Admit: 2024-02-17 | Discharge: 2024-02-17 | Disposition: A | Discharge status: Home / Self Care | Source: Ambulatory Visit | Attending: Orthopedic Surgery | Admitting: Orthopedic Surgery

## 2024-02-17 DIAGNOSIS — M25511 Pain in right shoulder: Secondary | ICD-10-CM | POA: Diagnosis not present

## 2024-02-17 DIAGNOSIS — M19019 Primary osteoarthritis, unspecified shoulder: Secondary | ICD-10-CM | POA: Diagnosis not present

## 2024-02-17 MED ORDER — METHYLPREDNISOLONE ACETATE 40 MG/ML INJ SUSP (RADIOLOG
40.0000 mg | Freq: Once | INTRAMUSCULAR | Status: AC
  Administered 2024-02-17: 40 mg via INTRA_ARTICULAR

## 2024-02-17 MED ORDER — LIDOCAINE HCL (PF) 1 % IJ SOLN
5.0000 mL | Freq: Once | INTRAMUSCULAR | Status: AC
Start: 2024-02-17 — End: 2024-02-17
  Administered 2024-02-17: 5 mL

## 2024-02-17 MED ORDER — BUPIVACAINE HCL (PF) 0.25 % IJ SOLN
10.0000 mL | Freq: Once | INTRAMUSCULAR | Status: AC
  Administered 2024-02-17: 10 mL via INTRA_ARTICULAR
  Filled 2024-02-17: qty 10

## 2024-02-17 MED ORDER — IOHEXOL 180 MG/ML  SOLN
10.0000 mL | Freq: Once | INTRAMUSCULAR | Status: AC | PRN
  Administered 2024-02-17: 10 mL via INTRA_ARTICULAR

## 2024-02-17 MED ORDER — BUPIVACAINE HCL (PF) 0.5 % IJ SOLN
INTRAMUSCULAR | Status: AC
Start: 2024-02-17 — End: 2024-02-17
  Filled 2024-02-17: qty 30

## 2024-02-17 MED ORDER — METHYLPREDNISOLONE ACETATE 40 MG/ML IJ SUSP
INTRAMUSCULAR | Status: AC
  Filled 2024-02-17: qty 1

## 2024-02-17 MED ORDER — LIDOCAINE HCL (PF) 1 % IJ SOLN
INTRAMUSCULAR | Status: AC
  Filled 2024-02-17: qty 5

## 2024-02-22 ENCOUNTER — Ambulatory Visit (HOSPITAL_COMMUNITY): Attending: Orthopedic Surgery | Admitting: Occupational Therapy

## 2024-02-22 ENCOUNTER — Other Ambulatory Visit: Payer: Self-pay

## 2024-02-22 ENCOUNTER — Encounter (HOSPITAL_COMMUNITY): Payer: Self-pay | Admitting: Occupational Therapy

## 2024-02-22 DIAGNOSIS — M25611 Stiffness of right shoulder, not elsewhere classified: Secondary | ICD-10-CM | POA: Insufficient documentation

## 2024-02-22 DIAGNOSIS — R29898 Other symptoms and signs involving the musculoskeletal system: Secondary | ICD-10-CM | POA: Diagnosis not present

## 2024-02-22 DIAGNOSIS — M19019 Primary osteoarthritis, unspecified shoulder: Secondary | ICD-10-CM | POA: Diagnosis not present

## 2024-02-22 DIAGNOSIS — M25511 Pain in right shoulder: Secondary | ICD-10-CM | POA: Diagnosis not present

## 2024-02-22 NOTE — Therapy (Signed)
 OUTPATIENT OCCUPATIONAL THERAPY ORTHO EVALUATION  Patient Name: [name] MRN: [id number] DOB:Oct 13, 1945, 78 y.o., female Today's Date: 02/22/2024   END OF SESSION:  OT End of Session - 02/22/24 1552     Visit Number 1    Number of Visits 7    Date for Recertification  04/06/24    Authorization Type [employer]    Progress Note Due on Visit 10    OT Start Time 1450    OT Stop Time 1529    OT Time Calculation (min) 39 min    Activity Tolerance Patient tolerated treatment well    Behavior During Therapy WFL for tasks assessed/performed          Past Medical History:  Diagnosis Date   Anxiety    Benign essential tremor    Cardiac murmur    Reportedly for years-mild   Chronic diarrhea    Degenerative joint disease    Diabetes mellitus    Diabetic neuropathy (HCC)    Diverticulosis    Essential hypertension, benign    GERD (gastroesophageal reflux disease)    GI bleed    NSAID use   PONV (postoperative nausea and vomiting)    Seasonal allergies    Shortness of breath    TIA (transient ischemic attack)    1998   Type 2 diabetes mellitus (HCC)    Urinary incontinence    Past Surgical History:  Procedure Laterality Date   ABDOMINAL HYSTERECTOMY     APPENDECTOMY     Arthroscopic left shoulder surgery     BREAST SURGERY     lumpectomy X 2-benign   CARPAL TUNNEL RELEASE Left 10/03/2012   Procedure: CARPAL TUNNEL RELEASE;  Surgeon: Lamar LULLA Leonor Mickey., MD;  Location: [facility];  Service: Orthopedics;  Laterality: Left;   CHOLECYSTECTOMY     COLON SURGERY     COLONOSCOPY  03/03/09   COLONOSCOPY  09/16/2004   ROURK   Colostomy with reversal     ESOPHAGOGASTRODUODENOSCOPY     EGD 06/08/2008   ESOPHAGOGASTRODUODENOSCOPY  09/16/04   DIAG EGD W/TCS   INCISIONAL HERNIA REPAIR N/A 12/25/2021   Procedure: HERNIA REPAIR INCISIONAL;  Surgeon: Tye Millet, DO;  Location: [facility];  Service: General;  Laterality: N/A;   REPLACEMENT TOTAL KNEE  BILATERAL  2001, 2005   Right elbow surgery     TENDON RECONSTRUCTION Left 10/03/2012   Procedure: LEFT MEDIAL ELBOW RECONSTRUCTION;  Surgeon: Lamar LULLA Leonor Mickey., MD;  Location: [facility];  Service: Orthopedics;  Laterality: Left;   TONSILLECTOMY     Patient Active Problem List   Diagnosis Date Noted   Bronchitis 02/13/2024   Pneumonia 02/13/2024   Sinusitis 02/13/2024   Anxiety with agitation 05/26/2022   Depression 05/26/2022   Class 1 obesity due to excess calories with serious comorbidity and body mass index (BMI) of 30.0 to 30.9 in adult 05/06/2022   Mixed hyperlipidemia 02/03/2022   Hypothyroidism 02/03/2022   Essential tremor 12/30/2021   Ischemic stroke (HCC) 12/29/2021   AKI (acute kidney injury) 12/25/2021   Abdominal pain 12/25/2021   Incarcerated hernia 12/25/2021   Pain in right knee 11/21/2019   Bilateral hand pain 07/01/2017   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   Diarrhea 10/16/2013   IBS (irritable bowel syndrome) 08/08/2012   GERD (gastroesophageal reflux disease) 08/08/2012   Benign paroxysmal positional vertigo 01/14/2012   Tremor 08/05/2011   Dysphagia 08/05/2011   Shortness of breath 09/16/2010   Undiagnosed cardiac murmurs 09/16/2010  Essential hypertension, benign 09/16/2010   Type 2 diabetes mellitus with stage 3b chronic kidney disease, without long-term current use of insulin  (HCC) 09/16/2010    PCP: Rosamond Mon, MD REFERRING PROVIDER: Margrette Bars, MD  ONSET DATE: 4 years  REFERRING DIAG: R shoulder pain  THERAPY DIAG:  Right shoulder pain, unspecified chronicity  Shoulder stiffness, right  Other symptoms and signs involving the musculoskeletal system  Rationale for Evaluation and Treatment: Rehabilitation  SUBJECTIVE:   SUBJECTIVE STATEMENT: Higinio Fess has been working on me Pt accompanied by: self  PERTINENT HISTORY: Pt has had 4 injections in her R shoulder with minimal to moderate relief of  pain.  PRECAUTIONS: None  WEIGHT BEARING RESTRICTIONS: No  PAIN:  Are you having pain? Yes: NPRS scale: 7/10 Pain location: L wrist and thumb Pain description: aching Aggravating factors: movement Relieving factors: medication  FALLS: Has patient fallen in last 6 months? Yes. Number of falls 2  PLOF: Independent with basic ADLs  PATIENT GOALS: To decrease pain  NEXT MD VISIT: 03/28/24  OBJECTIVE:   HAND DOMINANCE: Right  ADLs: Overall ADLs: Pt is unable to reach to head level or behind back, limiting dressing and bathing. Pt unable lift or carry items to cook or clean.   UPPER EXTREMITY ROM:       Assessed in sitting, er/IR adducted  Active ROM Right eval  Shoulder flexion 68  Shoulder abduction 88  Shoulder internal rotation 90  Shoulder external rotation 20  (Blank rows = not tested)    UPPER EXTREMITY MMT:     Assessed in seated, er/IR adducted  MMT Right eval  Shoulder flexion 3+/5  Shoulder abduction 3/5  Shoulder internal rotation 3+/5  Shoulder external rotation 3/5  (Blank rows = not tested)  SENSATION: No numbness noted.  EDEMA: no swelling noted  OBSERVATIONS: Moderate fascial restrictions along biceps, scapular region, and trapezius - highly sensitive    TODAY'S TREATMENT:                                                                                                                              DATE:   02/22/24 -Pendulums: seated, 2x30 -Table Slides: flexion, abduction, x10    PATIENT EDUCATION: Education details: Table slides and pendulums Person educated: Patient Education method: Explanation, Demonstration, and Handouts Education comprehension: verbalized understanding and returned demonstration  HOME EXERCISE PROGRAM: 10/1: Table Slides and Pendulums  GOALS: Goals reviewed with patient? Yes   SHORT TERM GOALS: Target date: 04/06/24  Pt will be provided with and educated on HEP to improve mobility in RUE required for  use during ADL completion.   Goal status: INITIAL   LONG TERM GOALS: Target date: 04/06/24  Pt will decrease pain in RUE to 3/10 or less to improve ability to sleep for 2+ consecutive hours without waking due to pain.   Goal status: INITIAL  2.  Pt will decrease RUE fascial restrictions to min amounts or less to improve  mobility required for functional reaching tasks.   Goal status: INITIAL  3.  Pt will increase RUE A/ROM by 25 degrees to improve ability to use RUE when reaching overhead or behind back during dressing and bathing tasks.   Goal status: INITIAL  4.  Pt will increase RUE strength to 4/5 or greater to improve ability to use RUE when lifting or carrying items during meal preparation/housework/yardwork tasks.   Goal status: INITIAL  5.  Pt will return to highest level of function using RUE as dominant/non-dominant during functional task completion.   Goal status: INITIAL   ASSESSMENT:  CLINICAL IMPRESSION: Patient is a 78 y.o. female who was seen today for occupational therapy evaluation for R shoulder and arm pain. Pt presents with increased pain and fascial restrictions, decreased ROM, strength, and functional use of the RUE.   PERFORMANCE DEFICITS: in functional skills including in functional skills including ADLs, IADLs, coordination, tone, ROM, strength, pain, fascial restrictions, muscle spasms, and UE functional use.  IMPAIRMENTS: are limiting patient from ADLs, IADLs, rest and sleep, leisure, and social participation.   COMORBIDITIES: has no other co-morbidities that affects occupational performance. Patient will benefit from skilled OT to address above impairments and improve overall function.  MODIFICATION OR ASSISTANCE TO COMPLETE EVALUATION: No modification of tasks or assist necessary to complete an evaluation.  OT OCCUPATIONAL PROFILE AND HISTORY: Problem focused assessment: Including review of records relating to presenting problem.  CLINICAL  DECISION MAKING: LOW - limited treatment options, no task modification necessary  REHAB POTENTIAL: Good  EVALUATION COMPLEXITY: Low      PLAN:  OT FREQUENCY: 1-2x/week  OT DURATION: 6 weeks  PLANNED INTERVENTIONS: 97168 OT Re-evaluation, 97535 self care/ADL training, 02889 therapeutic exercise, 97530 therapeutic activity, 97112 neuromuscular re-education, 97140 manual therapy, 97035 ultrasound, 97010 moist heat, 97032 electrical stimulation (manual), passive range of motion, functional mobility training, energy conservation, coping strategies training, patient/family education, and DME and/or AE instructions  RECOMMENDED OTHER SERVICES: N/A  CONSULTED AND AGREED WITH PLAN OF CARE: Patient  PLAN FOR NEXT SESSION: Manual Therapy, P/ROM, AA/ROM, Isometrics   London Nonaka Thelbert, OTR/L [facility] [phone] Valentin Jillyn Thelbert, OT 02/22/2024, 3:54 PM

## 2024-02-22 NOTE — Patient Instructions (Signed)

## 2024-03-07 ENCOUNTER — Encounter (HOSPITAL_COMMUNITY): Payer: Self-pay | Admitting: Occupational Therapy

## 2024-03-07 ENCOUNTER — Ambulatory Visit (HOSPITAL_COMMUNITY): Admitting: Occupational Therapy

## 2024-03-07 DIAGNOSIS — R29898 Other symptoms and signs involving the musculoskeletal system: Secondary | ICD-10-CM

## 2024-03-07 DIAGNOSIS — M25611 Stiffness of right shoulder, not elsewhere classified: Secondary | ICD-10-CM

## 2024-03-07 DIAGNOSIS — M25511 Pain in right shoulder: Secondary | ICD-10-CM | POA: Diagnosis not present

## 2024-03-07 NOTE — Therapy (Unsigned)
 OUTPATIENT OCCUPATIONAL THERAPY ORTHO TREATMENT NOTE  Patient Name: MAAHI LANNAN MRN: [id number] DOB:1945-06-07, 78 y.o., female Today's Date: 03/08/2024   END OF SESSION:  OT End of Session - 03/07/24 1533     Visit Number 2    Number of Visits 7    Date for Recertification  04/06/24    Authorization Type [employer]    Progress Note Due on Visit 10    OT Start Time 1451    OT Stop Time 1533    OT Time Calculation (min) 42 min    Activity Tolerance Patient tolerated treatment well    Behavior During Therapy WFL for tasks assessed/performed           Past Medical History:  Diagnosis Date   Anxiety    Benign essential tremor    Cardiac murmur    Reportedly for years-mild   Chronic diarrhea    Degenerative joint disease    Diabetes mellitus    Diabetic neuropathy (HCC)    Diverticulosis    Essential hypertension, benign    GERD (gastroesophageal reflux disease)    GI bleed    NSAID use   PONV (postoperative nausea and vomiting)    Seasonal allergies    Shortness of breath    TIA (transient ischemic attack)    1998   Type 2 diabetes mellitus (HCC)    Urinary incontinence    Past Surgical History:  Procedure Laterality Date   ABDOMINAL HYSTERECTOMY     APPENDECTOMY     Arthroscopic left shoulder surgery     BREAST SURGERY     lumpectomy X 2-benign   CARPAL TUNNEL RELEASE Left 10/03/2012   Procedure: CARPAL TUNNEL RELEASE;  Surgeon: Lamar LULLA Leonor Mickey., MD;  Location: [facility];  Service: Orthopedics;  Laterality: Left;   CHOLECYSTECTOMY     COLON SURGERY     COLONOSCOPY  03/03/09   COLONOSCOPY  09/16/2004   ROURK   Colostomy with reversal     ESOPHAGOGASTRODUODENOSCOPY     EGD 06/08/2008   ESOPHAGOGASTRODUODENOSCOPY  09/16/04   DIAG EGD W/TCS   INCISIONAL HERNIA REPAIR N/A 12/25/2021   Procedure: HERNIA REPAIR INCISIONAL;  Surgeon: Tye Millet, DO;  Location: [facility];  Service: General;  Laterality: N/A;   REPLACEMENT TOTAL  KNEE BILATERAL  2001, 2005   Right elbow surgery     TENDON RECONSTRUCTION Left 10/03/2012   Procedure: LEFT MEDIAL ELBOW RECONSTRUCTION;  Surgeon: Lamar LULLA Leonor Mickey., MD;  Location: [facility];  Service: Orthopedics;  Laterality: Left;   TONSILLECTOMY     Patient Active Problem List   Diagnosis Date Noted   Bronchitis 02/13/2024   Pneumonia 02/13/2024   Sinusitis 02/13/2024   Anxiety with agitation 05/26/2022   Depression 05/26/2022   Class 1 obesity due to excess calories with serious comorbidity and body mass index (BMI) of 30.0 to 30.9 in adult 05/06/2022   Mixed hyperlipidemia 02/03/2022   Hypothyroidism 02/03/2022   Essential tremor 12/30/2021   Ischemic stroke (HCC) 12/29/2021   AKI (acute kidney injury) 12/25/2021   Abdominal pain 12/25/2021   Incarcerated hernia 12/25/2021   Pain in right knee 11/21/2019   Bilateral hand pain 07/01/2017   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   Diarrhea 10/16/2013   IBS (irritable bowel syndrome) 08/08/2012   GERD (gastroesophageal reflux disease) 08/08/2012   Benign paroxysmal positional vertigo 01/14/2012   Tremor 08/05/2011   Dysphagia 08/05/2011   Shortness of breath 09/16/2010   Undiagnosed cardiac murmurs 09/16/2010  Essential hypertension, benign 09/16/2010   Type 2 diabetes mellitus with stage 3b chronic kidney disease, without long-term current use of insulin  (HCC) 09/16/2010    PCP: Rosamond Mon, MD REFERRING PROVIDER: Margrette Bars, MD  ONSET DATE: 4 years  REFERRING DIAG: R shoulder pain  THERAPY DIAG:  Right shoulder pain, unspecified chronicity  Shoulder stiffness, right  Other symptoms and signs involving the musculoskeletal system  Rationale for Evaluation and Treatment: Rehabilitation  SUBJECTIVE:   SUBJECTIVE STATEMENT: I really can move my arm better Pt accompanied by: self  PERTINENT HISTORY: Pt has had 4 injections in her R shoulder with minimal to moderate relief of  pain.  PRECAUTIONS: None  WEIGHT BEARING RESTRICTIONS: No  PAIN:  Are you having pain? Yes: NPRS scale: 3/10 Pain location: L wrist and thumb Pain description: aching Aggravating factors: movement Relieving factors: medication  FALLS: Has patient fallen in last 6 months? Yes. Number of falls 2  PLOF: Independent with basic ADLs  PATIENT GOALS: To decrease pain  NEXT MD VISIT: 03/28/24  OBJECTIVE:   HAND DOMINANCE: Right  ADLs: Overall ADLs: Pt is unable to reach to head level or behind back, limiting dressing and bathing. Pt unable lift or carry items to cook or clean.   UPPER EXTREMITY ROM:       Assessed in sitting, er/IR adducted  Active ROM Right eval  Shoulder flexion 68  Shoulder abduction 88  Shoulder internal rotation 90  Shoulder external rotation 20  (Blank rows = not tested)    UPPER EXTREMITY MMT:     Assessed in seated, er/IR adducted  MMT Right eval  Shoulder flexion 3+/5  Shoulder abduction 3/5  Shoulder internal rotation 3+/5  Shoulder external rotation 3/5  (Blank rows = not tested)  SENSATION: No numbness noted.  EDEMA: no swelling noted  OBSERVATIONS: Moderate fascial restrictions along biceps, scapular region, and trapezius - highly sensitive    TODAY'S TREATMENT:                                                                                                                              DATE:   03/07/24 -Manual Therapy: myofascial release and trigger point applied to biceps, scapular region, trapezius, and axillary region in order to reduce pain and fascial restrictions and improve ROM.  -P/ROM: supine- flexion, abduction, horizontal abduction, er/IR, x10 -AA/ROM: supine - flexion, abduction, horizontal abduction, er/IR, x10 -Pendulum: seated 2x30 -Table Slides: flexion, abduction, x12  02/22/24 -Pendulums: seated, 2x30 -Table Slides: flexion, abduction, x10    PATIENT EDUCATION: Education details: AA/ROM Person  educated: Patient Education method: Programmer, multimedia, Facilities manager, and Handouts Education comprehension: verbalized understanding and returned demonstration  HOME EXERCISE PROGRAM: 10/1: Table Slides and Pendulums 10/15: AA/ROM  GOALS: Goals reviewed with patient? Yes   SHORT TERM GOALS: Target date: 04/06/24  Pt will be provided with and educated on HEP to improve mobility in RUE required for use during ADL completion.   Goal status: IN  PROGRESS   LONG TERM GOALS: Target date: 04/06/24  Pt will decrease pain in RUE to 3/10 or less to improve ability to sleep for 2+ consecutive hours without waking due to pain.   Goal status: IN PROGRESS  2.  Pt will decrease RUE fascial restrictions to min amounts or less to improve mobility required for functional reaching tasks.   Goal status: IN PROGRESS  3.  Pt will increase RUE A/ROM by 25 degrees to improve ability to use RUE when reaching overhead or behind back during dressing and bathing tasks.   Goal status: IN PROGRESS  4.  Pt will increase RUE strength to 4/5 or greater to improve ability to use RUE when lifting or carrying items during meal preparation/housework/yardwork tasks.   Goal status: IN PROGRESS  5.  Pt will return to highest level of function using RUE as dominant/non-dominant during functional task completion.   Goal status: IN PROGRESS   ASSESSMENT:  CLINICAL IMPRESSION: This session pt is reporting improved pain and mobility since having the steroid shot. With P/ROM she is achieving 80% of full ROM and 70-75% of full ROM with AA/ROM. Pain remained low throughout session, however pt fatigued quickly and required multiple rest breaks. OT providing verbal and tactile cuing for positioning and technique.   PERFORMANCE DEFICITS: in functional skills including in functional skills including ADLs, IADLs, coordination, tone, ROM, strength, pain, fascial restrictions, muscle spasms, and UE functional use.   PLAN:  OT  FREQUENCY: 1-2x/week  OT DURATION: 6 weeks  PLANNED INTERVENTIONS: 97168 OT Re-evaluation, 97535 self care/ADL training, 02889 therapeutic exercise, 97530 therapeutic activity, 97112 neuromuscular re-education, 97140 manual therapy, 97035 ultrasound, 97010 moist heat, 97032 electrical stimulation (manual), passive range of motion, functional mobility training, energy conservation, coping strategies training, patient/family education, and DME and/or AE instructions  RECOMMENDED OTHER SERVICES: N/A  CONSULTED AND AGREED WITH PLAN OF CARE: Patient  PLAN FOR NEXT SESSION: Manual Therapy, P/ROM, AA/ROM, Isometrics   Tya Haughey Thelbert, OTR/L [facility] [phone] Aaleigha Bozza Jillyn Thelbert, OT 03/08/2024, 1:40 PM

## 2024-03-14 ENCOUNTER — Encounter (HOSPITAL_COMMUNITY): Payer: Self-pay | Admitting: Occupational Therapy

## 2024-03-14 ENCOUNTER — Ambulatory Visit (HOSPITAL_COMMUNITY): Admitting: Occupational Therapy

## 2024-03-14 DIAGNOSIS — M25511 Pain in right shoulder: Secondary | ICD-10-CM | POA: Diagnosis not present

## 2024-03-14 DIAGNOSIS — M25611 Stiffness of right shoulder, not elsewhere classified: Secondary | ICD-10-CM

## 2024-03-14 DIAGNOSIS — R29898 Other symptoms and signs involving the musculoskeletal system: Secondary | ICD-10-CM

## 2024-03-14 NOTE — Patient Instructions (Signed)

## 2024-03-14 NOTE — Therapy (Unsigned)
 OUTPATIENT OCCUPATIONAL THERAPY ORTHO TREATMENT NOTE  Patient Name: [name] MRN: [id number] DOB:03/27/1946, 78 y.o., female Today's Date: 03/15/2024   END OF SESSION:  OT End of Session - 03/14/24 1530     Visit Number 3    Number of Visits 7    Date for Recertification  04/06/24    Authorization Type [employer]    Progress Note Due on Visit 10    OT Start Time 1450    OT Stop Time 1530    OT Time Calculation (min) 40 min    Activity Tolerance Patient tolerated treatment well    Behavior During Therapy WFL for tasks assessed/performed            Past Medical History:  Diagnosis Date   Anxiety    Benign essential tremor    Cardiac murmur    Reportedly for years-mild   Chronic diarrhea    Degenerative joint disease    Diabetes mellitus    Diabetic neuropathy (HCC)    Diverticulosis    Essential hypertension, benign    GERD (gastroesophageal reflux disease)    GI bleed    NSAID use   PONV (postoperative nausea and vomiting)    Seasonal allergies    Shortness of breath    TIA (transient ischemic attack)    1998   Type 2 diabetes mellitus (HCC)    Urinary incontinence    Past Surgical History:  Procedure Laterality Date   ABDOMINAL HYSTERECTOMY     APPENDECTOMY     Arthroscopic left shoulder surgery     BREAST SURGERY     lumpectomy X 2-benign   CARPAL TUNNEL RELEASE Left 10/03/2012   Procedure: CARPAL TUNNEL RELEASE;  Surgeon: Lamar LULLA Leonor Mickey., MD;  Location: [facility];  Service: Orthopedics;  Laterality: Left;   CHOLECYSTECTOMY     COLON SURGERY     COLONOSCOPY  03/03/09   COLONOSCOPY  09/16/2004   ROURK   Colostomy with reversal     ESOPHAGOGASTRODUODENOSCOPY     EGD 06/08/2008   ESOPHAGOGASTRODUODENOSCOPY  09/16/04   DIAG EGD W/TCS   INCISIONAL HERNIA REPAIR N/A 12/25/2021   Procedure: HERNIA REPAIR INCISIONAL;  Surgeon: Tye Millet, DO;  Location: [facility];  Service: General;  Laterality: N/A;   REPLACEMENT TOTAL  KNEE BILATERAL  2001, 2005   Right elbow surgery     TENDON RECONSTRUCTION Left 10/03/2012   Procedure: LEFT MEDIAL ELBOW RECONSTRUCTION;  Surgeon: Lamar LULLA Leonor Mickey., MD;  Location: [facility];  Service: Orthopedics;  Laterality: Left;   TONSILLECTOMY     Patient Active Problem List   Diagnosis Date Noted   Bronchitis 02/13/2024   Pneumonia 02/13/2024   Sinusitis 02/13/2024   Anxiety with agitation 05/26/2022   Depression 05/26/2022   Class 1 obesity due to excess calories with serious comorbidity and body mass index (BMI) of 30.0 to 30.9 in adult 05/06/2022   Mixed hyperlipidemia 02/03/2022   Hypothyroidism 02/03/2022   Essential tremor 12/30/2021   Ischemic stroke (HCC) 12/29/2021   AKI (acute kidney injury) 12/25/2021   Abdominal pain 12/25/2021   Incarcerated hernia 12/25/2021   Pain in right knee 11/21/2019   Bilateral hand pain 07/01/2017   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   Diarrhea 10/16/2013   IBS (irritable bowel syndrome) 08/08/2012   GERD (gastroesophageal reflux disease) 08/08/2012   Benign paroxysmal positional vertigo 01/14/2012   Tremor 08/05/2011   Dysphagia 08/05/2011   Shortness of breath 09/16/2010   Undiagnosed cardiac murmurs  09/16/2010   Essential hypertension, benign 09/16/2010   Type 2 diabetes mellitus with stage 3b chronic kidney disease, without long-term current use of insulin  (HCC) 09/16/2010    PCP: Rosamond Mon, MD REFERRING PROVIDER: Margrette Bars, MD  ONSET DATE: 4 years  REFERRING DIAG: R shoulder pain  THERAPY DIAG:  Right shoulder pain, unspecified chronicity  Shoulder stiffness, right  Other symptoms and signs involving the musculoskeletal system  Rationale for Evaluation and Treatment: Rehabilitation  SUBJECTIVE:   SUBJECTIVE STATEMENT: I really can move my arm better Pt accompanied by: self  PERTINENT HISTORY: Pt has had 4 injections in her R shoulder with minimal to moderate relief of  pain.  PRECAUTIONS: None  WEIGHT BEARING RESTRICTIONS: No  PAIN:  Are you having pain? Yes: NPRS scale: 3/10 Pain location: L wrist and thumb Pain description: aching Aggravating factors: movement Relieving factors: medication  FALLS: Has patient fallen in last 6 months? Yes. Number of falls 2  PLOF: Independent with basic ADLs  PATIENT GOALS: To decrease pain  NEXT MD VISIT: 03/28/24  OBJECTIVE:   HAND DOMINANCE: Right  ADLs: Overall ADLs: Pt is unable to reach to head level or behind back, limiting dressing and bathing. Pt unable lift or carry items to cook or clean.   UPPER EXTREMITY ROM:       Assessed in sitting, er/IR adducted  Active ROM Right eval  Shoulder flexion 68  Shoulder abduction 88  Shoulder internal rotation 90  Shoulder external rotation 20  (Blank rows = not tested)    UPPER EXTREMITY MMT:     Assessed in seated, er/IR adducted  MMT Right eval  Shoulder flexion 3+/5  Shoulder abduction 3/5  Shoulder internal rotation 3+/5  Shoulder external rotation 3/5  (Blank rows = not tested)  SENSATION: No numbness noted.  EDEMA: no swelling noted  OBSERVATIONS: Moderate fascial restrictions along biceps, scapular region, and trapezius - highly sensitive    TODAY'S TREATMENT:                                                                                                                              DATE:   03/14/24 -Manual Therapy: myofascial release and trigger point applied to biceps, scapular region, trapezius, and axillary region in order to reduce pain and fascial restrictions and improve ROM.  -P/ROM: supine- flexion, abduction, horizontal abduction, er/IR, x6 -AA/ROM: supine - flexion, abduction, horizontal abduction, er/IR, x10 -A/ROM: supine - flexion, abduction, horizontal abduction, er/IR, x10 -Scapular Strengthening: red band, extension, retraction, rows, x10  03/07/24 -Manual Therapy: myofascial release and trigger point  applied to biceps, scapular region, trapezius, and axillary region in order to reduce pain and fascial restrictions and improve ROM.  -P/ROM: supine- flexion, abduction, horizontal abduction, er/IR, x10 -AA/ROM: supine - flexion, abduction, horizontal abduction, er/IR, x10 -Pendulum: seated 2x30 -Table Slides: flexion, abduction, x12  02/22/24 -Pendulums: seated, 2x30 -Table Slides: flexion, abduction, x10    PATIENT EDUCATION: Education details: A/ROM Person  educated: Patient Education method: Explanation, Demonstration, and Handouts Education comprehension: verbalized understanding and returned demonstration  HOME EXERCISE PROGRAM: 10/1: Table Slides and Pendulums 10/15: AA/ROM 10/22: A/ROM  GOALS: Goals reviewed with patient? Yes   SHORT TERM GOALS: Target date: 04/06/24  Pt will be provided with and educated on HEP to improve mobility in RUE required for use during ADL completion.   Goal status: IN PROGRESS   LONG TERM GOALS: Target date: 04/06/24  Pt will decrease pain in RUE to 3/10 or less to improve ability to sleep for 2+ consecutive hours without waking due to pain.   Goal status: IN PROGRESS  2.  Pt will decrease RUE fascial restrictions to min amounts or less to improve mobility required for functional reaching tasks.   Goal status: IN PROGRESS  3.  Pt will increase RUE A/ROM by 25 degrees to improve ability to use RUE when reaching overhead or behind back during dressing and bathing tasks.   Goal status: IN PROGRESS  4.  Pt will increase RUE strength to 4/5 or greater to improve ability to use RUE when lifting or carrying items during meal preparation/housework/yardwork tasks.   Goal status: IN PROGRESS  5.  Pt will return to highest level of function using RUE as dominant/non-dominant during functional task completion.   Goal status: IN PROGRESS   ASSESSMENT:  CLINICAL IMPRESSION: Pt is demonstrating good improvements with her ROM. She is  having improved tolerance with decreased pain, however continued severe fascial restrictions along the biceps with increased tenderness, addressed with manual therapy. Pt then able to increase to A/ROM with good technique. OT providing verbal and tactile cuing for positioning and technique throughout session.   PERFORMANCE DEFICITS: in functional skills including in functional skills including ADLs, IADLs, coordination, tone, ROM, strength, pain, fascial restrictions, muscle spasms, and UE functional use.   PLAN:  OT FREQUENCY: 1-2x/week  OT DURATION: 6 weeks  PLANNED INTERVENTIONS: 97168 OT Re-evaluation, 97535 self care/ADL training, 02889 therapeutic exercise, 97530 therapeutic activity, 97112 neuromuscular re-education, 97140 manual therapy, 97035 ultrasound, 97010 moist heat, 97032 electrical stimulation (manual), passive range of motion, functional mobility training, energy conservation, coping strategies training, patient/family education, and DME and/or AE instructions  RECOMMENDED OTHER SERVICES: N/A  CONSULTED AND AGREED WITH PLAN OF CARE: Patient  PLAN FOR NEXT SESSION: Manual Therapy, P/ROM, AA/ROM, Isometrics   Delainie Chavana Thelbert, OTR/L [facility] [phone] Kolbie Lepkowski Jillyn Thelbert, OT 03/15/2024, 8:25 AM

## 2024-03-21 ENCOUNTER — Encounter (HOSPITAL_COMMUNITY): Payer: Self-pay | Admitting: Occupational Therapy

## 2024-03-21 ENCOUNTER — Ambulatory Visit (HOSPITAL_COMMUNITY): Admitting: Occupational Therapy

## 2024-03-21 DIAGNOSIS — M25611 Stiffness of right shoulder, not elsewhere classified: Secondary | ICD-10-CM

## 2024-03-21 DIAGNOSIS — R29898 Other symptoms and signs involving the musculoskeletal system: Secondary | ICD-10-CM

## 2024-03-21 DIAGNOSIS — M25511 Pain in right shoulder: Secondary | ICD-10-CM | POA: Diagnosis not present

## 2024-03-21 NOTE — Therapy (Signed)
 OUTPATIENT OCCUPATIONAL THERAPY ORTHO TREATMENT NOTE  Patient Name: [name] MRN: [id number] DOB:November 15, 1945, 78 y.o., female Today's Date: 03/21/2024   END OF SESSION:  OT End of Session - 03/21/24 1534     Visit Number 4    Number of Visits 7    Date for Recertification  04/06/24    Authorization Type [employer]    Progress Note Due on Visit 10    OT Start Time 1443    OT Stop Time 1527    OT Time Calculation (min) 44 min    Activity Tolerance Patient tolerated treatment well    Behavior During Therapy WFL for tasks assessed/performed          Past Medical History:  Diagnosis Date   Anxiety    Benign essential tremor    Cardiac murmur    Reportedly for years-mild   Chronic diarrhea    Degenerative joint disease    Diabetes mellitus    Diabetic neuropathy (HCC)    Diverticulosis    Essential hypertension, benign    GERD (gastroesophageal reflux disease)    GI bleed    NSAID use   PONV (postoperative nausea and vomiting)    Seasonal allergies    Shortness of breath    TIA (transient ischemic attack)    1998   Type 2 diabetes mellitus (HCC)    Urinary incontinence    Past Surgical History:  Procedure Laterality Date   ABDOMINAL HYSTERECTOMY     APPENDECTOMY     Arthroscopic left shoulder surgery     BREAST SURGERY     lumpectomy X 2-benign   CARPAL TUNNEL RELEASE Left 10/03/2012   Procedure: CARPAL TUNNEL RELEASE;  Surgeon: Lamar LULLA Leonor Mickey., MD;  Location: [facility];  Service: Orthopedics;  Laterality: Left;   CHOLECYSTECTOMY     COLON SURGERY     COLONOSCOPY  03/03/09   COLONOSCOPY  09/16/2004   ROURK   Colostomy with reversal     ESOPHAGOGASTRODUODENOSCOPY     EGD 06/08/2008   ESOPHAGOGASTRODUODENOSCOPY  09/16/04   DIAG EGD W/TCS   INCISIONAL HERNIA REPAIR N/A 12/25/2021   Procedure: HERNIA REPAIR INCISIONAL;  Surgeon: Tye Millet, DO;  Location: [facility];  Service: General;  Laterality: N/A;   REPLACEMENT TOTAL KNEE  BILATERAL  2001, 2005   Right elbow surgery     TENDON RECONSTRUCTION Left 10/03/2012   Procedure: LEFT MEDIAL ELBOW RECONSTRUCTION;  Surgeon: Lamar LULLA Leonor Mickey., MD;  Location: [facility];  Service: Orthopedics;  Laterality: Left;   TONSILLECTOMY     Patient Active Problem List   Diagnosis Date Noted   Bronchitis 02/13/2024   Pneumonia 02/13/2024   Sinusitis 02/13/2024   Anxiety with agitation 05/26/2022   Depression 05/26/2022   Class 1 obesity due to excess calories with serious comorbidity and body mass index (BMI) of 30.0 to 30.9 in adult 05/06/2022   Mixed hyperlipidemia 02/03/2022   Hypothyroidism 02/03/2022   Essential tremor 12/30/2021   Ischemic stroke (HCC) 12/29/2021   AKI (acute kidney injury) 12/25/2021   Abdominal pain 12/25/2021   Incarcerated hernia 12/25/2021   Pain in right knee 11/21/2019   Bilateral hand pain 07/01/2017   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   Diarrhea 10/16/2013   IBS (irritable bowel syndrome) 08/08/2012   GERD (gastroesophageal reflux disease) 08/08/2012   Benign paroxysmal positional vertigo 01/14/2012   Tremor 08/05/2011   Dysphagia 08/05/2011   Shortness of breath 09/16/2010   Undiagnosed cardiac murmurs 09/16/2010  Essential hypertension, benign 09/16/2010   Type 2 diabetes mellitus with stage 3b chronic kidney disease, without long-term current use of insulin  (HCC) 09/16/2010    PCP: Rosamond Mon, MD REFERRING PROVIDER: Margrette Bars, MD  ONSET DATE: 4 years  REFERRING DIAG: R shoulder pain  THERAPY DIAG:  Right shoulder pain, unspecified chronicity  Shoulder stiffness, right  Other symptoms and signs involving the musculoskeletal system  Rationale for Evaluation and Treatment: Rehabilitation  SUBJECTIVE:   SUBJECTIVE STATEMENT: I bruised from the massage last time Pt accompanied by: self  PERTINENT HISTORY: Pt has had 4 injections in her R shoulder with minimal to moderate relief of  pain.  PRECAUTIONS: None  WEIGHT BEARING RESTRICTIONS: No  PAIN:  Are you having pain? Yes: NPRS scale: 2/10 Pain location: wrist and thumb Pain description: aching Aggravating factors: movement Relieving factors: medication  FALLS: Has patient fallen in last 6 months? Yes. Number of falls 2  PLOF: Independent with basic ADLs  PATIENT GOALS: To decrease pain  NEXT MD VISIT: 03/28/24  OBJECTIVE:   HAND DOMINANCE: Right  ADLs: Overall ADLs: Pt is unable to reach to head level or behind back, limiting dressing and bathing. Pt unable lift or carry items to cook or clean.   UPPER EXTREMITY ROM:       Assessed in sitting, er/IR adducted  Active ROM Right eval  Shoulder flexion 68  Shoulder abduction 88  Shoulder internal rotation 90  Shoulder external rotation 20  (Blank rows = not tested)    UPPER EXTREMITY MMT:     Assessed in seated, er/IR adducted  MMT Right eval  Shoulder flexion 3+/5  Shoulder abduction 3/5  Shoulder internal rotation 3+/5  Shoulder external rotation 3/5  (Blank rows = not tested)  SENSATION: No numbness noted.  EDEMA: no swelling noted  OBSERVATIONS: Moderate fascial restrictions along biceps, scapular region, and trapezius - highly sensitive    TODAY'S TREATMENT:                                                                                                                              DATE:   03/21/24 -Manual Therapy: myofascial release and trigger point applied to biceps, scapular region, trapezius, and axillary region in order to reduce pain and fascial restrictions and improve ROM.  -P/ROM: supine- flexion, abduction, horizontal abduction, er/IR, x6 -A/ROM: supine - flexion, abduction, horizontal abduction, er/IR, x12 -PNF strengthening: yellow band, chest pulls, overhead pulls, er pulls, PNF up, PNF down, x12 -UBE: level 1, 2' forwards and backwards  03/14/24 -Manual Therapy: myofascial release and trigger point applied  to biceps, scapular region, trapezius, and axillary region in order to reduce pain and fascial restrictions and improve ROM.  -P/ROM: supine- flexion, abduction, horizontal abduction, er/IR, x6 -AA/ROM: supine - flexion, abduction, horizontal abduction, er/IR, x10 -A/ROM: supine - flexion, abduction, horizontal abduction, er/IR, x10 -Scapular Strengthening: red band, extension, retraction, rows, x10  03/07/24 -Manual Therapy: myofascial release and trigger point  applied to biceps, scapular region, trapezius, and axillary region in order to reduce pain and fascial restrictions and improve ROM.  -P/ROM: supine- flexion, abduction, horizontal abduction, er/IR, x10 -AA/ROM: supine - flexion, abduction, horizontal abduction, er/IR, x10 -Pendulum: seated 2x30 -Table Slides: flexion, abduction, x12   PATIENT EDUCATION: Education details: PNF Strengthening Person educated: Patient Education method: Explanation, Demonstration, and Handouts Education comprehension: verbalized understanding and returned demonstration  HOME EXERCISE PROGRAM: 10/1: Table Slides and Pendulums 10/15: AA/ROM 10/22: A/ROM 10/29: PNF Strengthening  GOALS: Goals reviewed with patient? Yes   SHORT TERM GOALS: Target date: 04/06/24  Pt will be provided with and educated on HEP to improve mobility in RUE required for use during ADL completion.   Goal status: IN PROGRESS   LONG TERM GOALS: Target date: 04/06/24  Pt will decrease pain in RUE to 3/10 or less to improve ability to sleep for 2+ consecutive hours without waking due to pain.   Goal status: IN PROGRESS  2.  Pt will decrease RUE fascial restrictions to min amounts or less to improve mobility required for functional reaching tasks.   Goal status: IN PROGRESS  3.  Pt will increase RUE A/ROM by 25 degrees to improve ability to use RUE when reaching overhead or behind back during dressing and bathing tasks.   Goal status: IN PROGRESS  4.  Pt will  increase RUE strength to 4/5 or greater to improve ability to use RUE when lifting or carrying items during meal preparation/housework/yardwork tasks.   Goal status: IN PROGRESS  5.  Pt will return to highest level of function using RUE as dominant/non-dominant during functional task completion.   Goal status: IN PROGRESS   ASSESSMENT:  CLINICAL IMPRESSION: This session pt is demonstrating improved ROM and strength. She was able to achieve full ROM in supine with no limitations. OT added PNF strengthening to improve functional strength and continue promoting full ROM. Verbal and tactile cuing provided for positioning and technique throughout session.   PERFORMANCE DEFICITS: in functional skills including in functional skills including ADLs, IADLs, coordination, tone, ROM, strength, pain, fascial restrictions, muscle spasms, and UE functional use.   PLAN:  OT FREQUENCY: 1-2x/week  OT DURATION: 6 weeks  PLANNED INTERVENTIONS: 97168 OT Re-evaluation, 97535 self care/ADL training, 02889 therapeutic exercise, 97530 therapeutic activity, 97112 neuromuscular re-education, 97140 manual therapy, 97035 ultrasound, 97010 moist heat, 97032 electrical stimulation (manual), passive range of motion, functional mobility training, energy conservation, coping strategies training, patient/family education, and DME and/or AE instructions  RECOMMENDED OTHER SERVICES: N/A  CONSULTED AND AGREED WITH PLAN OF CARE: Patient  PLAN FOR NEXT SESSION: Manual Therapy, P/ROM, AA/ROM, Isometrics   Kurtiss Wence Thelbert, OTR/L [facility] [phone] Evlyn Amason Jillyn Thelbert, OT 03/21/2024, 3:35 PM

## 2024-03-21 NOTE — Patient Instructions (Signed)

## 2024-03-28 ENCOUNTER — Encounter (HOSPITAL_COMMUNITY): Payer: Self-pay | Admitting: Occupational Therapy

## 2024-03-28 ENCOUNTER — Ambulatory Visit: Admitting: Orthopedic Surgery

## 2024-03-28 ENCOUNTER — Ambulatory Visit (HOSPITAL_COMMUNITY): Attending: Orthopedic Surgery | Admitting: Occupational Therapy

## 2024-03-28 DIAGNOSIS — M25511 Pain in right shoulder: Secondary | ICD-10-CM

## 2024-03-28 DIAGNOSIS — M25611 Stiffness of right shoulder, not elsewhere classified: Secondary | ICD-10-CM | POA: Insufficient documentation

## 2024-03-28 DIAGNOSIS — M792 Neuralgia and neuritis, unspecified: Secondary | ICD-10-CM | POA: Diagnosis not present

## 2024-03-28 DIAGNOSIS — R29898 Other symptoms and signs involving the musculoskeletal system: Secondary | ICD-10-CM | POA: Insufficient documentation

## 2024-03-28 DIAGNOSIS — M19019 Primary osteoarthritis, unspecified shoulder: Secondary | ICD-10-CM

## 2024-03-28 DIAGNOSIS — M4722 Other spondylosis with radiculopathy, cervical region: Secondary | ICD-10-CM

## 2024-03-28 NOTE — Therapy (Signed)
 OUTPATIENT OCCUPATIONAL THERAPY ORTHO TREATMENT NOTE DISCHARGE NOTE  Patient Name: [name] MRN: [id number] DOB:Jan 29, 1946, 78 y.o., female Today's Date: 03/28/2024  OCCUPATIONAL THERAPY DISCHARGE SUMMARY  Visits from Start of Care: 5  Current functional level related to goals / functional outcomes: Pt has met all OT goals.    Remaining deficits: Pt has no remaining deficits   Education / Equipment: Pt has comprehensive HEP.   Plan: Patient agrees to discharge as all OT goals have been met..       END OF SESSION:  OT End of Session - 03/28/24 1650     Visit Number 5    Number of Visits 7    Date for Recertification  04/06/24    Authorization Type [employer]    Progress Note Due on Visit 10    OT Start Time 1438    OT Stop Time 1521    OT Time Calculation (min) 43 min    Activity Tolerance Patient tolerated treatment well    Behavior During Therapy WFL for tasks assessed/performed           Past Medical History:  Diagnosis Date   Anxiety    Benign essential tremor    Cardiac murmur    Reportedly for years-mild   Chronic diarrhea    Degenerative joint disease    Diabetes mellitus    Diabetic neuropathy (HCC)    Diverticulosis    Essential hypertension, benign    GERD (gastroesophageal reflux disease)    GI bleed    NSAID use   PONV (postoperative nausea and vomiting)    Seasonal allergies    Shortness of breath    TIA (transient ischemic attack)    1998   Type 2 diabetes mellitus (HCC)    Urinary incontinence    Past Surgical History:  Procedure Laterality Date   ABDOMINAL HYSTERECTOMY     APPENDECTOMY     Arthroscopic left shoulder surgery     BREAST SURGERY     lumpectomy X 2-benign   CARPAL TUNNEL RELEASE Left 10/03/2012   Procedure: CARPAL TUNNEL RELEASE;  Surgeon: Lamar LULLA Leonor Mickey., MD;  Location: [facility];  Service: Orthopedics;  Laterality: Left;   CHOLECYSTECTOMY     COLON SURGERY     COLONOSCOPY   03/03/09   COLONOSCOPY  09/16/2004   ROURK   Colostomy with reversal     ESOPHAGOGASTRODUODENOSCOPY     EGD 06/08/2008   ESOPHAGOGASTRODUODENOSCOPY  09/16/04   DIAG EGD W/TCS   INCISIONAL HERNIA REPAIR N/A 12/25/2021   Procedure: HERNIA REPAIR INCISIONAL;  Surgeon: Tye Millet, DO;  Location: [facility];  Service: General;  Laterality: N/A;   REPLACEMENT TOTAL KNEE BILATERAL  2001, 2005   Right elbow surgery     TENDON RECONSTRUCTION Left 10/03/2012   Procedure: LEFT MEDIAL ELBOW RECONSTRUCTION;  Surgeon: Lamar LULLA Leonor Mickey., MD;  Location: [facility];  Service: Orthopedics;  Laterality: Left;   TONSILLECTOMY     Patient Active Problem List   Diagnosis Date Noted   Bronchitis 02/13/2024   Pneumonia 02/13/2024   Sinusitis 02/13/2024   Anxiety with agitation 05/26/2022   Depression 05/26/2022   Class 1 obesity due to excess calories with serious comorbidity and body mass index (BMI) of 30.0 to 30.9 in adult 05/06/2022   Mixed hyperlipidemia 02/03/2022   Hypothyroidism 02/03/2022   Essential tremor 12/30/2021   Ischemic stroke (HCC) 12/29/2021   AKI (acute kidney injury) 12/25/2021   Abdominal pain 12/25/2021  Incarcerated hernia 12/25/2021   Pain in right knee 11/21/2019   Bilateral hand pain 07/01/2017   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   Diarrhea 10/16/2013   IBS (irritable bowel syndrome) 08/08/2012   GERD (gastroesophageal reflux disease) 08/08/2012   Benign paroxysmal positional vertigo 01/14/2012   Tremor 08/05/2011   Dysphagia 08/05/2011   Shortness of breath 09/16/2010   Undiagnosed cardiac murmurs 09/16/2010   Essential hypertension, benign 09/16/2010   Type 2 diabetes mellitus with stage 3b chronic kidney disease, without long-term current use of insulin  (HCC) 09/16/2010    PCP: Rosamond Mon, MD REFERRING PROVIDER: Margrette Bars, MD  ONSET DATE: 4 years  REFERRING DIAG: R shoulder pain  THERAPY DIAG:  Right shoulder pain, unspecified  chronicity  Shoulder stiffness, right  Other symptoms and signs involving the musculoskeletal system  Rationale for Evaluation and Treatment: Rehabilitation  SUBJECTIVE:   SUBJECTIVE STATEMENT: The doctor released me. Pt accompanied by: self  PERTINENT HISTORY: Pt has had 4 injections in her R shoulder with minimal to moderate relief of pain.  PRECAUTIONS: None  WEIGHT BEARING RESTRICTIONS: No  PAIN:  Are you having pain? No  FALLS: Has patient fallen in last 6 months? Yes. Number of falls 2  PLOF: Independent with basic ADLs  PATIENT GOALS: To decrease pain  NEXT MD VISIT: 03/28/24  OBJECTIVE:   HAND DOMINANCE: Right  ADLs: Overall ADLs: Pt is unable to reach to head level or behind back, limiting dressing and bathing. Pt unable lift or carry items to cook or clean.   UPPER EXTREMITY ROM:       Assessed in sitting, er/IR adducted  Active ROM Right eval Right 03/28/24  Shoulder flexion 68 155  Shoulder abduction 88 158  Shoulder internal rotation 90 90  Shoulder external rotation 20 64  (Blank rows = not tested)    UPPER EXTREMITY MMT:     Assessed in seated, er/IR adducted  MMT Right eval Right 03/28/24  Shoulder flexion 3+/5 4+/5  Shoulder abduction 3/5 4+/5  Shoulder internal rotation 3+/5 5/5  Shoulder external rotation 3/5 4+/5  (Blank rows = not tested)  SENSATION: No numbness noted.  EDEMA: no swelling noted  OBSERVATIONS: Moderate fascial restrictions along biceps, scapular region, and trapezius - highly sensitive    TODAY'S TREATMENT:                                                                                                                              DATE:   03/28/24 -Manual Therapy: myofascial release and trigger point applied to biceps, scapular region, trapezius, and axillary region in order to reduce pain and fascial restrictions and improve ROM.  -P/ROM: supine- flexion, abduction, horizontal abduction, er/IR,  x6 -A/ROM: supine - flexion, abduction, horizontal abduction, er/IR, x12 -x to V arms, x15 -Goal post arms, x15 -Reassessment  03/21/24 -Manual Therapy: myofascial release and trigger point applied to biceps, scapular region, trapezius, and axillary region in order to reduce pain and fascial  restrictions and improve ROM.  -P/ROM: supine- flexion, abduction, horizontal abduction, er/IR, x6 -A/ROM: supine - flexion, abduction, horizontal abduction, er/IR, x12 -PNF strengthening: yellow band, chest pulls, overhead pulls, er pulls, PNF up, PNF down, x12 -UBE: level 1, 2' forwards and backwards  03/14/24 -Manual Therapy: myofascial release and trigger point applied to biceps, scapular region, trapezius, and axillary region in order to reduce pain and fascial restrictions and improve ROM.  -P/ROM: supine- flexion, abduction, horizontal abduction, er/IR, x6 -AA/ROM: supine - flexion, abduction, horizontal abduction, er/IR, x10 -A/ROM: supine - flexion, abduction, horizontal abduction, er/IR, x10 -Scapular Strengthening: red band, extension, retraction, rows, x10    PATIENT EDUCATION: Education details: Reviewed HEP Person educated: Patient Education method: Explanation, Demonstration, and Handouts Education comprehension: verbalized understanding and returned demonstration  HOME EXERCISE PROGRAM: 10/1: Table Slides and Pendulums 10/15: AA/ROM 10/22: A/ROM 10/29: PNF Strengthening  GOALS: Goals reviewed with patient? Yes   SHORT TERM GOALS: Target date: 04/06/24  Pt will be provided with and educated on HEP to improve mobility in RUE required for use during ADL completion.   Goal status: MET   LONG TERM GOALS: Target date: 04/06/24  Pt will decrease pain in RUE to 3/10 or less to improve ability to sleep for 2+ consecutive hours without waking due to pain.   Goal status: MET  2.  Pt will decrease RUE fascial restrictions to min amounts or less to improve mobility required  for functional reaching tasks.   Goal status: MET  3.  Pt will increase RUE A/ROM by 25 degrees to improve ability to use RUE when reaching overhead or behind back during dressing and bathing tasks.   Goal status: MET  4.  Pt will increase RUE strength to 4/5 or greater to improve ability to use RUE when lifting or carrying items during meal preparation/housework/yardwork tasks.   Goal status: MET  5.  Pt will return to highest level of function using RUE as dominant/non-dominant during functional task completion.   Goal status: MET   ASSESSMENT:  CLINICAL IMPRESSION: Pt presenting to OT session reporting that the doctor has released her and she doesn't need therapy any more. She demonstrated full ROM, as well as much improved strength. Pt feels that there is nothing limiting her at home and she is back to her base line. OT will discharge at this time, as pt has no further skilled OT needs.   PERFORMANCE DEFICITS: in functional skills including in functional skills including ADLs, IADLs, coordination, tone, ROM, strength, pain, fascial restrictions, muscle spasms, and UE functional use.   PLAN:  OT FREQUENCY: 1-2x/week  OT DURATION: 6 weeks  PLANNED INTERVENTIONS: 97168 OT Re-evaluation, 97535 self care/ADL training, 02889 therapeutic exercise, 97530 therapeutic activity, 97112 neuromuscular re-education, 97140 manual therapy, 97035 ultrasound, 97010 moist heat, 97032 electrical stimulation (manual), passive range of motion, functional mobility training, energy conservation, coping strategies training, patient/family education, and DME and/or AE instructions  RECOMMENDED OTHER SERVICES: N/A  CONSULTED AND AGREED WITH PLAN OF CARE: Patient  PLAN FOR NEXT SESSION: Discharge   Khallid Pasillas Thelbert, OTR/L [facility] [phone] Jailin Manocchio Jillyn Thelbert, OT 03/28/2024, 4:51 PM

## 2024-03-28 NOTE — Progress Notes (Signed)
    03/28/2024   Chief Complaint  Patient presents with   Shoulder Pain    Right- better has good motion   Neck Pain    Encounter Diagnoses  Name Primary?   Glenohumeral arthritis Yes   Radicular pain of right upper extremity    Cervical spondylosis with radiculopathy    Acute pain of right shoulder     What pharmacy do you use ? ______[facility]_____  DOI/DOS/ Date: 03/28/24  Did you get better, worse or no change (Answer below)   Improved

## 2024-03-28 NOTE — Progress Notes (Signed)
    Visit type follow-up  03/28/2024   Chief Complaint  Patient presents with   Shoulder Pain    Right- better has good motion   Neck Pain    Encounter Diagnoses  Name Primary?   Glenohumeral arthritis Yes   Radicular pain of right upper extremity    Cervical spondylosis with radiculopathy    Acute pain of right shoulder     What pharmacy do you use ? ______[facility]_____  DOI/DOS/ Date: 03/28/24  Did you get better, worse or no change (Answer below)   Improved after physical therapy with improved range of motion in the right shoulder  02/15/2024  The pain actually goes from the neck down into the hand and crosses over the shoulder   78 year old female pain in the right shoulder at least since April as previous notes indicate an injection was done at that time.  She had multiple injections in the right shoulder and complains of pain which runs from her neck down into her hand and is associated with loss of shoulder motion and painful range of motion of the shoulder   She has had multiple injections actually in the shoulder.  She has a history of diabetes hypertension kidney disease   Medication only Tylenol    Kidney function estimated GFR is 29 dated 1 year ago  03/28/24  The patient reports improved range of motion decreased pain neck and shoulder  Exam findings show improved abduction forward elevation up to 145 degrees improved range of motion cervical spine rotation right and left   Assessment and plan   Encounter Diagnoses  Name Primary?   Glenohumeral arthritis Yes   Radicular pain of right upper extremity    Cervical spondylosis with radiculopathy    Acute pain of right shoulder     Improved  May discontinue physical therapy  Return as needed

## 2024-04-04 ENCOUNTER — Encounter (HOSPITAL_COMMUNITY): Admitting: Occupational Therapy

## 2024-04-11 ENCOUNTER — Encounter (HOSPITAL_COMMUNITY): Admitting: Occupational Therapy
